# Patient Record
Sex: Female | Born: 1945 | Race: White | Hispanic: No | Marital: Married | Smoking: Former smoker
Health system: Southern US, Community
[De-identification: ages and names within clinical notes are randomized; demographics above are authoritative.]

## PROBLEM LIST (undated history)

## (undated) DIAGNOSIS — I10 Essential (primary) hypertension: Secondary | ICD-10-CM

## (undated) DIAGNOSIS — N200 Calculus of kidney: Secondary | ICD-10-CM

## (undated) DIAGNOSIS — Z85828 Personal history of other malignant neoplasm of skin: Secondary | ICD-10-CM

## (undated) DIAGNOSIS — R918 Other nonspecific abnormal finding of lung field: Secondary | ICD-10-CM

## (undated) DIAGNOSIS — Z923 Personal history of irradiation: Secondary | ICD-10-CM

## (undated) DIAGNOSIS — F17210 Nicotine dependence, cigarettes, uncomplicated: Secondary | ICD-10-CM

## (undated) DIAGNOSIS — R011 Cardiac murmur, unspecified: Secondary | ICD-10-CM

## (undated) DIAGNOSIS — C3490 Malignant neoplasm of unspecified part of unspecified bronchus or lung: Principal | ICD-10-CM

## (undated) DIAGNOSIS — J449 Chronic obstructive pulmonary disease, unspecified: Secondary | ICD-10-CM

## (undated) DIAGNOSIS — I712 Thoracic aortic aneurysm, without rupture: Secondary | ICD-10-CM

## (undated) DIAGNOSIS — I7121 Aneurysm of the ascending aorta, without rupture: Secondary | ICD-10-CM

## (undated) DIAGNOSIS — E785 Hyperlipidemia, unspecified: Secondary | ICD-10-CM

## (undated) DIAGNOSIS — M1990 Unspecified osteoarthritis, unspecified site: Secondary | ICD-10-CM

## (undated) DIAGNOSIS — C801 Malignant (primary) neoplasm, unspecified: Secondary | ICD-10-CM

## (undated) HISTORY — PX: COLONOSCOPY: SHX174

## (undated) HISTORY — DX: Calculus of kidney: N20.0

## (undated) HISTORY — DX: Malignant neoplasm of unspecified part of unspecified bronchus or lung: C34.90

## (undated) HISTORY — DX: Personal history of other malignant neoplasm of skin: Z85.828

## (undated) HISTORY — DX: Nicotine dependence, cigarettes, uncomplicated: F17.210

## (undated) HISTORY — DX: Personal history of irradiation: Z92.3

## (undated) HISTORY — DX: Hyperlipidemia, unspecified: E78.5

## (undated) HISTORY — DX: Essential (primary) hypertension: I10

---

## 1976-01-29 HISTORY — PX: PARTIAL HYSTERECTOMY: SHX80

## 2000-01-27 ENCOUNTER — Encounter: Payer: Self-pay | Admitting: Family Medicine

## 2000-01-27 ENCOUNTER — Encounter: Payer: Self-pay | Admitting: Emergency Medicine

## 2000-01-27 ENCOUNTER — Inpatient Hospital Stay (HOSPITAL_COMMUNITY): Admission: EM | Admit: 2000-01-27 | Discharge: 2000-01-28 | Payer: Self-pay | Admitting: Emergency Medicine

## 2000-01-29 DIAGNOSIS — N200 Calculus of kidney: Secondary | ICD-10-CM

## 2000-01-29 HISTORY — PX: KIDNEY STONE SURGERY: SHX686

## 2000-01-29 HISTORY — DX: Calculus of kidney: N20.0

## 2000-01-31 ENCOUNTER — Encounter: Payer: Self-pay | Admitting: Urology

## 2000-01-31 ENCOUNTER — Ambulatory Visit (HOSPITAL_COMMUNITY): Admission: RE | Admit: 2000-01-31 | Discharge: 2000-01-31 | Payer: Self-pay | Admitting: Urology

## 2000-02-01 ENCOUNTER — Encounter: Admission: RE | Admit: 2000-02-01 | Discharge: 2000-02-01 | Payer: Self-pay | Admitting: Family Medicine

## 2000-11-28 ENCOUNTER — Other Ambulatory Visit: Admission: RE | Admit: 2000-11-28 | Discharge: 2000-11-28 | Payer: Self-pay | Admitting: Family Medicine

## 2002-07-30 ENCOUNTER — Other Ambulatory Visit: Admission: RE | Admit: 2002-07-30 | Discharge: 2002-07-30 | Payer: Self-pay | Admitting: Family Medicine

## 2004-02-13 ENCOUNTER — Ambulatory Visit: Payer: Self-pay | Admitting: Family Medicine

## 2004-02-16 ENCOUNTER — Ambulatory Visit: Payer: Self-pay | Admitting: Family Medicine

## 2004-05-25 ENCOUNTER — Ambulatory Visit: Payer: Self-pay | Admitting: Family Medicine

## 2004-07-13 ENCOUNTER — Ambulatory Visit: Payer: Self-pay | Admitting: Family Medicine

## 2005-01-04 ENCOUNTER — Ambulatory Visit: Payer: Self-pay | Admitting: Family Medicine

## 2005-03-22 ENCOUNTER — Other Ambulatory Visit: Admission: RE | Admit: 2005-03-22 | Discharge: 2005-03-22 | Payer: Self-pay | Admitting: Family Medicine

## 2005-03-22 ENCOUNTER — Encounter: Payer: Self-pay | Admitting: Family Medicine

## 2005-03-22 ENCOUNTER — Ambulatory Visit: Payer: Self-pay | Admitting: Family Medicine

## 2005-03-22 LAB — CONVERTED CEMR LAB: Pap Smear: NORMAL

## 2005-04-04 ENCOUNTER — Ambulatory Visit: Payer: Self-pay | Admitting: Family Medicine

## 2005-08-05 ENCOUNTER — Ambulatory Visit: Payer: Self-pay | Admitting: Family Medicine

## 2005-11-25 ENCOUNTER — Ambulatory Visit: Payer: Self-pay | Admitting: Family Medicine

## 2005-11-29 ENCOUNTER — Ambulatory Visit: Payer: Self-pay | Admitting: Family Medicine

## 2005-11-29 ENCOUNTER — Ambulatory Visit: Payer: Self-pay | Admitting: Cardiology

## 2005-12-06 ENCOUNTER — Ambulatory Visit: Payer: Self-pay | Admitting: Family Medicine

## 2006-04-28 ENCOUNTER — Ambulatory Visit: Payer: Self-pay | Admitting: Family Medicine

## 2006-04-28 LAB — CONVERTED CEMR LAB
ALT: 23 units/L (ref 0–40)
AST: 25 units/L (ref 0–37)
Albumin: 3.9 g/dL (ref 3.5–5.2)
Alkaline Phosphatase: 94 units/L (ref 39–117)
BUN: 10 mg/dL (ref 6–23)
Basophils Absolute: 0.1 10*3/uL (ref 0.0–0.1)
Basophils Relative: 0.7 % (ref 0.0–1.0)
Bilirubin, Direct: 0.1 mg/dL (ref 0.0–0.3)
CO2: 30 meq/L (ref 19–32)
Calcium: 9.9 mg/dL (ref 8.4–10.5)
Chloride: 104 meq/L (ref 96–112)
Cholesterol: 148 mg/dL (ref 0–200)
Creatinine, Ser: 0.7 mg/dL (ref 0.4–1.2)
Eosinophils Absolute: 0.3 10*3/uL (ref 0.0–0.6)
Eosinophils Relative: 3.9 % (ref 0.0–5.0)
GFR calc Af Amer: 111 mL/min
GFR calc non Af Amer: 91 mL/min
Glucose, Bld: 105 mg/dL — ABNORMAL HIGH (ref 70–99)
HCT: 38.1 % (ref 36.0–46.0)
HDL: 52.8 mg/dL (ref >39.0)
Hemoglobin: 13.1 g/dL (ref 12.0–15.0)
LDL Cholesterol: 78 mg/dL (ref 0–99)
Lymphocytes Relative: 27.8 % (ref 12.0–46.0)
MCHC: 34.4 g/dL (ref 30.0–36.0)
MCV: 91.2 fL (ref 78.0–100.0)
Monocytes Absolute: 0.6 10*3/uL (ref 0.2–0.7)
Monocytes Relative: 7.2 % (ref 3.0–11.0)
Neutro Abs: 4.6 10*3/uL (ref 1.4–7.7)
Neutrophils Relative %: 60.4 % (ref 43.0–77.0)
Platelets: 230 10*3/uL (ref 150–400)
Potassium: 4.5 meq/L (ref 3.5–5.1)
RBC: 4.17 M/uL (ref 3.87–5.11)
RDW: 12 % (ref 11.5–14.6)
Sodium: 139 meq/L (ref 135–145)
TSH: 1.54 microintl units/mL (ref 0.35–5.50)
Total Bilirubin: 0.8 mg/dL (ref 0.3–1.2)
Total CHOL/HDL Ratio: 2.8
Total Protein: 7.5 g/dL (ref 6.0–8.3)
Triglycerides: 85 mg/dL (ref 0–149)
VLDL: 17 mg/dL (ref 0–40)
WBC: 7.8 10*3/uL (ref 4.5–10.5)

## 2006-05-09 ENCOUNTER — Ambulatory Visit: Payer: Self-pay | Admitting: Family Medicine

## 2006-10-17 ENCOUNTER — Ambulatory Visit: Payer: Self-pay | Admitting: Family Medicine

## 2006-10-17 DIAGNOSIS — I10 Essential (primary) hypertension: Secondary | ICD-10-CM | POA: Insufficient documentation

## 2006-10-17 DIAGNOSIS — C44300 Unspecified malignant neoplasm of skin of unspecified part of face: Secondary | ICD-10-CM | POA: Insufficient documentation

## 2006-10-17 DIAGNOSIS — E785 Hyperlipidemia, unspecified: Secondary | ICD-10-CM

## 2006-10-17 DIAGNOSIS — C44309 Unspecified malignant neoplasm of skin of other parts of face: Secondary | ICD-10-CM | POA: Insufficient documentation

## 2006-10-17 DIAGNOSIS — Z87891 Personal history of nicotine dependence: Secondary | ICD-10-CM

## 2006-10-17 DIAGNOSIS — J301 Allergic rhinitis due to pollen: Secondary | ICD-10-CM

## 2006-10-24 ENCOUNTER — Telehealth: Payer: Self-pay | Admitting: Family Medicine

## 2007-07-10 ENCOUNTER — Ambulatory Visit: Payer: Self-pay | Admitting: Family Medicine

## 2007-07-13 LAB — CONVERTED CEMR LAB
AST: 21 units/L (ref 0–37)
Albumin: 3.9 g/dL (ref 3.5–5.2)
BUN: 18 mg/dL (ref 6–23)
Basophils Relative: 1.1 % — ABNORMAL HIGH (ref 0.0–1.0)
Chloride: 101 meq/L (ref 96–112)
Creatinine, Ser: 0.7 mg/dL (ref 0.4–1.2)
Eosinophils Absolute: 0.3 10*3/uL (ref 0.0–0.7)
Eosinophils Relative: 4.3 % (ref 0.0–5.0)
GFR calc non Af Amer: 91 mL/min
HCT: 36.1 % (ref 36.0–46.0)
Hemoglobin: 12.5 g/dL (ref 12.0–15.0)
MCV: 92.7 fL (ref 78.0–100.0)
Monocytes Absolute: 0.4 10*3/uL (ref 0.1–1.0)
RBC: 3.9 M/uL (ref 3.87–5.11)
TSH: 1.13 microintl units/mL (ref 0.35–5.50)
WBC: 6.3 10*3/uL (ref 4.5–10.5)

## 2007-07-16 ENCOUNTER — Ambulatory Visit: Payer: Self-pay | Admitting: Family Medicine

## 2007-07-16 LAB — CONVERTED CEMR LAB: OCCULT 3: NEGATIVE

## 2007-07-21 LAB — FECAL OCCULT BLOOD, GUAIAC: Fecal Occult Blood: NEGATIVE

## 2007-09-02 ENCOUNTER — Encounter: Payer: Self-pay | Admitting: Family Medicine

## 2008-02-22 ENCOUNTER — Telehealth: Payer: Self-pay | Admitting: Family Medicine

## 2008-04-01 ENCOUNTER — Ambulatory Visit: Payer: Self-pay | Admitting: Family Medicine

## 2008-04-01 DIAGNOSIS — E162 Hypoglycemia, unspecified: Secondary | ICD-10-CM

## 2008-04-04 LAB — CONVERTED CEMR LAB
Calcium: 9.6 mg/dL (ref 8.4–10.5)
GFR calc Af Amer: 94 mL/min
GFR calc non Af Amer: 78 mL/min
Glucose, Bld: 112 mg/dL — ABNORMAL HIGH (ref 70–99)
Hgb A1c MFr Bld: 6 % (ref 4.6–6.0)
LDL Cholesterol: 105 mg/dL — ABNORMAL HIGH (ref 0–99)
Phosphorus: 3.2 mg/dL (ref 2.3–4.6)
Potassium: 5.1 meq/L (ref 3.5–5.1)
Sodium: 138 meq/L (ref 135–145)
Total CHOL/HDL Ratio: 3.2
VLDL: 11 mg/dL (ref 0–40)

## 2008-09-02 ENCOUNTER — Ambulatory Visit: Payer: Self-pay | Admitting: Family Medicine

## 2008-09-02 DIAGNOSIS — R7309 Other abnormal glucose: Secondary | ICD-10-CM | POA: Insufficient documentation

## 2008-09-05 LAB — CONVERTED CEMR LAB
Basophils Relative: 0.5 % (ref 0.0–3.0)
Eosinophils Absolute: 0.4 10*3/uL (ref 0.0–0.7)
Eosinophils Relative: 5.7 % — ABNORMAL HIGH (ref 0.0–5.0)
HCT: 35.7 % — ABNORMAL LOW (ref 36.0–46.0)
Hemoglobin: 12.5 g/dL (ref 12.0–15.0)
MCHC: 35.1 g/dL (ref 30.0–36.0)
MCV: 93.7 fL (ref 78.0–100.0)
Monocytes Absolute: 0.6 10*3/uL (ref 0.1–1.0)
Neutro Abs: 4.6 10*3/uL (ref 1.4–7.7)
RBC: 3.81 M/uL — ABNORMAL LOW (ref 3.87–5.11)

## 2008-09-13 ENCOUNTER — Encounter: Payer: Self-pay | Admitting: Family Medicine

## 2008-09-23 ENCOUNTER — Encounter (INDEPENDENT_AMBULATORY_CARE_PROVIDER_SITE_OTHER): Payer: Self-pay | Admitting: *Deleted

## 2009-11-14 ENCOUNTER — Ambulatory Visit: Payer: Self-pay | Admitting: Family Medicine

## 2009-11-15 LAB — CONVERTED CEMR LAB
ALT: 19 units/L (ref 0–35)
AST: 22 units/L (ref 0–37)
Albumin: 3.9 g/dL (ref 3.5–5.2)
Alkaline Phosphatase: 93 units/L (ref 39–117)
Basophils Relative: 0.6 % (ref 0.0–3.0)
Bilirubin, Direct: 0 mg/dL (ref 0.0–0.3)
CO2: 30 meq/L (ref 19–32)
Calcium: 10 mg/dL (ref 8.4–10.5)
Chloride: 99 meq/L (ref 96–112)
Eosinophils Absolute: 0.5 10*3/uL (ref 0.0–0.7)
Eosinophils Relative: 7.1 % — ABNORMAL HIGH (ref 0.0–5.0)
Hemoglobin: 11.7 g/dL — ABNORMAL LOW (ref 12.0–15.0)
LDL Cholesterol: 91 mg/dL (ref 0–99)
Lymphocytes Relative: 25.5 % (ref 12.0–46.0)
MCHC: 34 g/dL (ref 30.0–36.0)
Neutro Abs: 4.1 10*3/uL (ref 1.4–7.7)
RBC: 3.72 M/uL — ABNORMAL LOW (ref 3.87–5.11)
Sodium: 137 meq/L (ref 135–145)
Total CHOL/HDL Ratio: 3
Total Protein: 7.1 g/dL (ref 6.0–8.3)
WBC: 6.9 10*3/uL (ref 4.5–10.5)

## 2009-12-01 ENCOUNTER — Ambulatory Visit: Payer: Self-pay | Admitting: Family Medicine

## 2009-12-08 ENCOUNTER — Encounter: Payer: Self-pay | Admitting: Family Medicine

## 2009-12-11 ENCOUNTER — Encounter: Payer: Self-pay | Admitting: Family Medicine

## 2009-12-15 ENCOUNTER — Ambulatory Visit: Payer: Self-pay | Admitting: Family Medicine

## 2009-12-15 DIAGNOSIS — D649 Anemia, unspecified: Secondary | ICD-10-CM

## 2009-12-29 ENCOUNTER — Ambulatory Visit: Payer: Self-pay | Admitting: Family Medicine

## 2010-02-09 ENCOUNTER — Telehealth: Payer: Self-pay | Admitting: Family Medicine

## 2010-02-12 ENCOUNTER — Encounter: Payer: Self-pay | Admitting: Family Medicine

## 2010-02-18 ENCOUNTER — Encounter: Payer: Self-pay | Admitting: Orthopaedic Surgery

## 2010-02-27 NOTE — Assessment & Plan Note (Signed)
Summary: Pamela Levine 2 WEEK NURSE VISIT BLOOD PRESSURE CHECK/RBH  Nurse Visit   Vital Signs:  Patient profile:   65 year old female Height:      67.5 inches Weight:      127.25 pounds BMI:     19.71 Temp:     98.2 degrees F oral Pulse rate:   80 / minute Pulse rhythm:   regular BP sitting:   142 / 70  (left arm) Cuff size:   regular  Vitals Entered By: Lewanda Rife LPN (December 01, 2009 11:00 AM) CC: Patient present today for BP check at request of physician. Comments Relevant hx: Pt came today for BP check with no complaints.Pt is taking Benazepril-HCTZ 20-12.5mg  taking two tabs everyday. Pt does not have BP med for tomorrow and does not have refills on med if pt is to stay on Benazepril HCTZ 20-12.5mg . Pt uses [facility] [address] as her pharmacy. Contact # for pt is home [phone] or cell [phone] or work [phone] and ask for [name] in the [employer]. Please advise. Lewanda Rife LPN  December 01, 2009 11:04 AM    that bp is better- continue current med px written on EMR for call in   Patient notified as instructed by telephone. Medication phoned to [facility] [address] pharmacy as instructed. Pt will monitor pressure at home and already has appt to see Dr Milinda Antis on 12/15/09.Lewanda Rife LPN  December 01, 2009 3:04 PM   Allergies: No Known Drug Allergies  Orders Added: 1)  Est. Patient Level I [78469] Prescriptions: BENAZEPRIL-HYDROCHLOROTHIAZIDE 20-12.5 MG  TABS (BENAZEPRIL-HYDROCHLOROTHIAZIDE) 2 by mouth every am  #180 x 3   Entered and Authorized by:   Judith Part MD   Signed by:   Lewanda Rife LPN on 62/95/2841   Method used:   Telephoned to ...         RxID:   [id number]

## 2010-02-27 NOTE — Letter (Signed)
Summary: [facility]: Immunoassay Fecal Occult Blood (iFOB) Order Form  [place] [facility]  [address] [place], [place] [address]   Phone: [phone]  Fax: [phone]      [facility]: Immunoassay Fecal Occult Blood (iFOB) Order Form   November 14, 2009 MRN: [id number]   Pamela Levine Jun 10, 1945   Physicican Name:________Tower _________________  Diagnosis Code:___________V76.51_______________      Judith Part MD

## 2010-02-27 NOTE — Letter (Signed)
Summary: Results Follow up Letter  [place] [facility]  [address] [place], [place] [address]   Phone: [phone]  Fax: [phone]    12/11/2009 MRN: [id number]  Pamela Levine [address] [place], [place]  [address]  Dear [name],  The following are the results of your recent test(s):  Test         Result    Pap Smear:        Normal _____  Not Normal _____ Comments: ______________________________________________________ Cholesterol: LDL(Bad cholesterol):         Your goal is less than:         HDL (Good cholesterol):       Your goal is more than: Comments:  ______________________________________________________ Mammogram:        Normal __X___  Not Normal _____ Comments:mammogram is normal, next mammogram due in 1 year   ___________________________________________________________________ Hemoccult:        Normal _____  Not normal _______ Comments:    _____________________________________________________________________ Other Tests:    We routinely do not discuss normal results over the telephone.  If you desire a copy of the results, or you have any questions about this information we can discuss them at your next office visit.   Sincerely,      [name], MD

## 2010-02-27 NOTE — Assessment & Plan Note (Signed)
Summary: FOLLOW UP TO DISCUSS LABS/RI   Vital Signs:  Patient profile:   65 year old female Height:      67.5 inches Weight:      129.50 pounds BMI:     20.06 Temp:     98 degrees F oral Pulse rate:   76 / minute Pulse rhythm:   regular BP sitting:   172 / 78  (left arm) Cuff size:   regular  Vitals Entered By: Lewanda Rife LPN (December 15, 2009 9:12 AM) CC: follow-up visit to discuss labs   History of Present Illness: here for f/u of HTN and hyperglycemia and lipids  recent labs also new anemia with Hb 11.7 from 12.5 does not give blood  last colonoscopy many many years ago  not a lot of read meat or leafy greens  takes centrum and caltrate   AIC 6.4 up from 5.9 son had DM -- thought to be from father  she has no other family with it   in general -not a lot of sweets  not a lot of bread or pasta -- sandwhich every day  pasta once per week  potato  is conservative with portions  no juice except V8  wt is up 2 lb  lipids ok with stating trig 56 and HDL 56 and LDL 91  bp up again 172/78 did take her bp med today the hyzaar worked better in past  wants to go back to that  walks at work for exercise    is eating healthy and exercising    Allergies (verified): No Known Drug Allergies  Past History:  Past Medical History: Last updated: 09/02/2008 Hyperlipidemia Hypertension hx of basal cell skin ca light smoker son- DM   derm-- Dr Orson Aloe   Past Surgical History: Last updated: 10/17/2006 Kidney stone- stent (01/2000) Hysterectomy- partial, bleeding (1978) Renal US- normal (12/1999) Dexa- normal (11/202) Carotid doppler- ok (04/2001) Dexa- normal (03/2005) LS films- deg disk, 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 Eyes:  Denies blurring. CV:  Denies chest pain or discomfort and palpitations. Resp:  Denies cough, shortness of breath, and wheezing. GI:  Denies abdominal pain, change in bowel habits, and indigestion. GU:  Denies dysuria and urinary frequency. MS:  Denies muscle aches and cramps. Derm:  Denies itching, lesion(s), poor wound healing, and rash. Neuro:  Denies numbness and tingling. Psych:  mood is ok . Endo:  Denies cold intolerance, excessive thirst, excessive urination, and heat intolerance. Heme:  Denies abnormal bruising and bleeding.  Physical Exam  General:  Well-developed,well-nourished,in no acute distress; alert,appropriate and cooperative throughout examination Head:  normocephalic, atraumatic, and no abnormalities observed.   Eyes:  vision grossly intact, pupils equal, pupils round, and pupils reactive to light.  no conjunctival pallor, injection or icterus  Mouth:  pharynx pink and moist.   Neck:  supple with full rom and no masses or thyromegally, no JVD or carotid bruit  Chest Wall:  No deformities, masses, or  tenderness noted. Lungs:  Normal respiratory effort, chest expands symmetrically. Lungs are clear to auscultation, no crackles or wheezes. Heart:  Normal rate and regular rhythm. S1 and S2 normal without gallop, murmur, click, rub or other extra sounds. Abdomen:  Bowel sounds positive,abdomen soft and non-tender without masses, organomegaly or hernias noted. no renal bruits  Msk:  No deformity or scoliosis noted of thoracic or lumbar spine.  no acute joint changes  Pulses:  R and L carotid,radial,femoral,dorsalis pedis and posterior tibial  pulses are full and equal bilaterally Extremities:  No clubbing, cyanosis, edema, or deformity noted with normal full range of motion of all joints.   Neurologic:  sensation intact to light touch, gait normal, and DTRs symmetrical and normal.   Skin:  Intact without suspicious lesions or rashes Cervical Nodes:  No lymphadenopathy noted Inguinal Nodes:  No significant adenopathy Psych:  normal affect, talkative and pleasant   Diabetes Management Exam:    Foot Exam (with socks and/or shoes not present):       Sensory-Pinprick/Light touch:          Left medial foot (L-4): normal          Left dorsal foot (L-5): normal          Left lateral foot (S-1): normal          Right medial foot (L-4): normal          Right dorsal foot (L-5): normal          Right lateral foot (S-1): normal       Sensory-Monofilament:          Left foot: normal          Right foot: normal       Inspection:          Left foot: normal          Right foot: normal       Nails:          Left foot: normal          Right foot: normal   Impression & Recommendations:  Problem # 1:  UNSPECIFIED ANEMIA (ICD-285.9) Assessment New new - asymptomatic  also in need of screen colonosc- will ref for that  Orders: Gastroenterology Referral (GI)  Problem # 2:  SCREENING, COLON CANCER (ICD-V76.51) Assessment: New see above Orders: Gastroenterology Referral (GI)  Problem # 3:  HYPERGLYCEMIA (ICD-790.29) Assessment: Deteriorated this is worse  rev low glycemic diet in detail  urged to read SUGAR BUSTERS re check AIC in 3 mo   Problem # 4:  HYPERTENSION (ICD-401.9) Assessment: Deteriorated  is worse - will go back to hyzaar- more aff now check bp nurse visit 2-4 wk  f/u 3 mo after labs  Her updated medication list for this problem includes:    Hyzaar 100-12.5 Mg Tabs (Losartan potassium-hctz) .Marland Kitchen... 1 by mouth once daily  Orders: Prescription Created Electronically [phone])  Problem # 5:  HYPERLIPIDEMIA  (ICD-272.4) Assessment: Unchanged  good control zocor and diet  commended on this  Her updated medication list for this problem includes:    Zocor 20 Mg Tabs (Simvastatin) .Marland Kitchen... 1 by mouth once daily  Labs Reviewed: SGOT: 22 (11/14/2009)   SGPT: 19 (11/14/2009)   HDL:56.40 (11/14/2009), 52.2 (04/01/2008)  LDL:91 (11/14/2009), 105 (60/45/4098)  Chol:159 (11/14/2009), 168 (04/01/2008)  Trig:56.0 (11/14/2009), 56 (04/01/2008)  Complete Medication List: 1)  Hyzaar 100-12.5 Mg Tabs (Losartan potassium-hctz) .Marland Kitchen.. 1 by mouth once daily 2)  Caltrate 600+d 600-400  Mg-unit Tabs (Calcium carbonate-vitamin d) .... 2 by mouth daily 3)  Centrum Silver Tabs (Multiple vitamins-minerals) .... One by mouth daily 4)  Zocor 20 Mg Tabs (Simvastatin) .Marland Kitchen.. 1 by mouth once daily  Patient Instructions: 1)  change back to hyzaar  2)  follow up for nurse visit for blood pressure check in 2-4 weeks  3)  we will do referral for colonoscopy at check  4)  eat balanced diet minimizing starches and sugars (like sweets / bread/ pasta / potato/ rice) -- always choose brown instead of white  5)  get regular exercise  6)  get book called SUGAR BUSTERS-- is helpful  7)  schedule non fasting lab in 3 months AIC / renal / cbc with diff / ferritin / iron level / B12 - for anemia and hyperglycemia  8)  then follow up  Prescriptions: HYZAAR 100-12.5 MG TABS (LOSARTAN POTASSIUM-HCTZ) 1 by mouth once daily  #30 x 11   Entered and Authorized by:   Judith Part MD   Signed by:   Judith Part MD on 12/15/2009   Method used:   Electronically to        [facility] [address] (retail)       [address], [address]       [place], [place]  [address]       Ph: [phone]       Fax: [phone]   RxID:   [id number]    Orders Added: 1)  Gastroenterology Referral [GI] 2)  Prescription Created Electronically [G8553] 3)  Est. Patient Level IV [84132]    Current Allergies (reviewed  today): No known allergies

## 2010-02-27 NOTE — Assessment & Plan Note (Signed)
Summary: CPX/CLE   Vital Signs:  Patient profile:   65 year old female Height:      67.5 inches Weight:      129.75 pounds BMI:     20.09 Temp:     98.3 degrees F oral Pulse rate:   72 / minute Pulse rhythm:   regular BP sitting:   154 / 72  (left arm) Cuff size:   regular  Vitals Entered By: Lewanda Rife LPN (November 14, 2009 10:38 AM)  Serial Vital Signs/Assessments:  Time      Position  BP       Pulse  Resp  Temp     By                     160/80                         Judith Part MD  CC: CPX LMP partail hyst 1978   History of Present Illness: here for health mt exam and to rev chronic med problems   overall is feeling good   wt is stable with bmi of 20  bp up first check 154/72- had been well controlled- just took med 30 min ago has not checked it outside of office  has not had headache   lipid-- needs check  hx of basal cell-- had last derm last year  some new spots to check  does not get out in the sun   hx of smoking - quit -- and still a non smoker/ doing well with that   hyst partial in past -- 07 was last pap   dexa nl in 07 ca and D- is good about taking that   mam 8/10-- needs to set that up self exam - no lumps , but has a mole on side of R breast   colon screen -- not interested in colonosc   Td 02   flu shot today  occ leg cramps at night   Allergies (verified): No Known Drug Allergies  Past History:  Past Medical History: Last updated: 09/02/2008 Hyperlipidemia Hypertension hx of basal cell skin ca light smoker son- DM   derm-- Dr Orson Aloe   Past Surgical History: Last updated: 10/17/2006 Kidney stone- stent (01/2000) Hysterectomy- partial, bleeding (1978) Renal US- normal (12/1999) Dexa- normal (11/202) Carotid doppler- ok (04/2001) Dexa- normal (03/2005) LS films- deg disk, 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 Eyes:  Denies blurring and eye irritation. CV:  Denies chest pain or discomfort, lightheadness, and palpitations. Resp:  Denies cough and wheezing. GI:  Denies abdominal pain, change in bowel habits, and nausea. GU:  Denies abnormal vaginal bleeding, discharge, hematuria, and urinary frequency. MS:  Complains of cramps; denies low back pain, mid back pain, and muscle aches. Derm:  Denies itching, lesion(s), poor wound healing, and rash. Neuro:  Denies numbness and tingling. Psych:  Denies anxiety and depression. Endo:  Denies cold intolerance, excessive thirst, excessive urination, and heat intolerance. Heme:  Denies abnormal bruising and bleeding.  Physical Exam  General:  Well-developed,well-nourished,in no acute  distress; alert,appropriate and cooperative throughout examination Head:  normocephalic, atraumatic, and no abnormalities observed.   Eyes:  vision grossly intact, pupils equal, pupils round, and pupils reactive to light.  no conjunctival pallor, injection or icterus  Ears:  R ear normal and L ear normal.   Nose:  no nasal discharge.   Mouth:  pharynx pink and moist.   Neck:  supple with full rom and no masses or thyromegally, no JVD or carotid bruit  Chest Wall:  No deformities, masses, or tenderness noted. Breasts:  No mass, nodules, thickening, tenderness, bulging, retraction, inflamation, nipple discharge or skin changes noted.   Lungs:  Normal respiratory effort, chest expands symmetrically. Lungs are clear to auscultation, no crackles or wheezes. Heart:  Normal rate and regular rhythm. S1 and S2 normal without gallop, murmur, click, rub or other extra  sounds. Abdomen:  Bowel sounds positive,abdomen soft and non-tender without masses, organomegaly or hernias noted. no renal bruits  Genitalia:  normal introitus, no external lesions, no vaginal discharge, mucosa pink and moist, no vaginal atrophy, and no friaility or hemorrhage.   bimanual exam done uterus surg absent  no M noted  Msk:  No deformity or scoliosis noted of thoracic or lumbar spine.  no acute joint changes  Pulses:  R and L carotid,radial,femoral,dorsalis pedis and posterior tibial pulses are full and equal bilaterally Extremities:  No clubbing, cyanosis, edema, or deformity noted with normal full range of motion of all joints.   Neurologic:  sensation intact to light touch, gait normal, and DTRs symmetrical and normal.   Skin:  Intact without suspicious lesions or rashes lentigos diffusely  Cervical Nodes:  No lymphadenopathy noted Axillary Nodes:  No palpable lymphadenopathy Inguinal Nodes:  No significant adenopathy Psych:  normal affect, talkative and pleasant    Impression & Recommendations:  Problem # 1:  HEALTH MAINTENANCE EXAM (ICD-V70.0) Assessment Comment Only reviewed health habits including diet, exercise and skin cancer prevention reviewed health maintenance list and family history  wellness labs today Td and flu shot today Orders: Venipuncture (19147) TLB-Lipid Panel (80061-LIPID) TLB-BMP (Basic Metabolic Panel-BMET) (80048-METABOL) TLB-CBC Platelet - w/Differential (85025-CBCD) TLB-Hepatic/Liver Function Pnl (80076-HEPATIC) TLB-TSH (Thyroid Stimulating Hormone) (84443-TSH)  Problem # 2:  ROUTINE GYNECOLOGICAL EXAMINATION (ICD-V72.31) Assessment: Comment Only exam done without pap - in light of prior hyst  no changes   Problem # 3:  HYPERGLYCEMIA (ICD-790.29) Assessment: Unchanged check AIC today diet good and wt is stable Orders: TLB-A1C / Hgb A1C (Glycohemoglobin) (83036-A1C)  Problem # 4:  OTHER SCREENING MAMMOGRAM (ICD-V76.12) Assessment:  Comment Only annual mammogram scheduled adv pt to continue regular self breast exams non remarkable breast exam today  Orders: Radiology Referral (Radiology)  Problem # 5:  Hx of CARCINOMA, BASAL CELL, FACE (ICD-173.3) Assessment: Unchanged ref to derm for annual exam  pt is good with using sun protection Orders: Dermatology Referral (Derma)  Problem # 6:  HYPERTENSION (ICD-401.9) Assessment: Deteriorated  bp is high  will re check in 2 weeks and then if still high- may need to add med  Her updated medication list for this problem includes:    Benazepril-hydrochlorothiazide 20-12.5 Mg Tabs (Benazepril-hydrochlorothiazide) .Marland Kitchen... 2 by mouth every am  Orders: Venipuncture (82956) TLB-Lipid Panel (80061-LIPID) TLB-BMP (Basic Metabolic Panel-BMET) (80048-METABOL) TLB-CBC Platelet - w/Differential (85025-CBCD) TLB-Hepatic/Liver Function Pnl (80076-HEPATIC) TLB-TSH (Thyroid Stimulating Hormone) (84443-TSH)  BP today: 154/72 Prior BP: 140/68 (09/02/2008)  Labs Reviewed: K+: 5.1 (04/01/2008) Creat: : 0.8 (04/01/2008)   Chol: 168 (04/01/2008)   HDL: 52.2 (04/01/2008)  LDL: 105 (04/01/2008)   TG: 56 (04/01/2008)  Problem # 7:  HYPERLIPIDEMIA (ICD-272.4) Assessment: Unchanged  lab today and update on statin and good diet enc to get extra exercise Her updated medication list for this problem includes:    Zocor 20 Mg Tabs (Simvastatin) .Marland Kitchen... 1 by mouth once daily  Orders: Venipuncture (24401) TLB-Lipid Panel (80061-LIPID) TLB-BMP (Basic Metabolic Panel-BMET) (80048-METABOL) TLB-CBC Platelet - w/Differential (85025-CBCD) TLB-Hepatic/Liver Function Pnl (80076-HEPATIC) TLB-TSH (Thyroid Stimulating Hormone) (02725-DGU) Prescription Created Electronically [phone])  Labs Reviewed: SGOT: 29 (04/01/2008)   SGPT: 24 (04/01/2008)   HDL:52.2 (04/01/2008), 50.3 (07/10/2007)  LDL:105 (04/01/2008), 83 (74/25/9563)  Chol:168 (04/01/2008), 149 (07/10/2007)  Trig:56 (04/01/2008), 79  (07/10/2007)  Complete Medication List: 1)  Benazepril-hydrochlorothiazide 20-12.5 Mg Tabs (Benazepril-hydrochlorothiazide) .... 2 by mouth every am 2)  Caltrate 600+d 600-400 Mg-unit Tabs (Calcium carbonate-vitamin d) .... 2 by mouth daily 3)  Centrum Silver Tabs (Multiple vitamins-minerals) .... One by mouth daily 4)  Zocor 20 Mg Tabs (Simvastatin) .Marland Kitchen.. 1 by mouth once daily  Other Orders: Admin 1st Vaccine (87564) Flu Vaccine 38yrs + (33295) TD Toxoids IM 7 YR + (18841) Admin of Any Addtl Vaccine (66063)  Patient Instructions: 1)  schedule nurse visit in 2 weeks for blood pressure check  2)  watch salt in diet  3)  stay as active as you can  4)  labs today 5)  flu shot and tetnus shot today  6)  we will schedule mammogram at check out  7)  please do stool card for colon screening  8)  we will do dermatology referral at check out  9)  the chiropractor I like in [place] is Dr Jeannetta Ellis  Prescriptions: ZOCOR 20 MG TABS (SIMVASTATIN) 1 by mouth once daily  #90 x 3   Entered and Authorized by:   Judith Part MD   Signed by:   Judith Part MD on 11/14/2009   Method used:   Electronically to        [facility] [address] (retail)       [address], [address]       [place], [place]  [address]       Ph: [phone]       Fax: [phone]   RxID:   [id number]    Orders Added: 1)  Admin 1st Vaccine [90471] 2)  Flu Vaccine 32yrs + [71062] 3)  Venipuncture [69485] 4)  TLB-Lipid Panel [80061-LIPID] 5)  TLB-BMP (Basic Metabolic Panel-BMET) [80048-METABOL] 6)  TLB-CBC Platelet - w/Differential [85025-CBCD] 7)  TLB-Hepatic/Liver Function Pnl [80076-HEPATIC] 8)  TLB-TSH (Thyroid Stimulating Hormone) [84443-TSH] 9)  TLB-A1C / Hgb A1C (Glycohemoglobin) [83036-A1C] 10)  TD Toxoids IM 7 YR + [90714] 11)  Admin of Any Addtl Vaccine [90472] 12)  Dermatology Referral [Derma] 13)  Radiology Referral [Radiology] 14)  Prescription Created  Electronically [G8553] 15)  Est. Patient 40-64 years [99396]   Immunizations Administered:  Tetanus Vaccine:    Vaccine Type: Td    Site: right deltoid    Mfr: Sanofi Pasteur    Dose: 0.5 ml    Route: IM    Given by: Lewanda Rife LPN    Exp. Date: 03/01/2011    Lot #: [id number]    VIS given: 12/16/07 version given November 14, 2009.   Immunizations Administered:  Tetanus Vaccine:    Vaccine Type: Td    Site: right deltoid    Mfr: Sanofi Pasteur    Dose: 0.5 ml  Route: IM    Given by: Lewanda Rife LPN    Exp. Date: 03/01/2011    Lot #: [id number]    VIS given: 12/16/07 version given November 14, 2009.  Current Allergies (reviewed today): No known allergies  Flu Vaccine Consent Questions     Do you have a history of severe allergic reactions to this vaccine? no    Any prior history of allergic reactions to egg and/or gelatin? no    Do you have a sensitivity to the preservative Thimersol? no    Do you have a past history of Guillan-Barre Syndrome? no    Do you currently have an acute febrile illness? no    Have you ever had a severe reaction to latex? no    Vaccine information given and explained to patient? yes    Are you currently pregnant? no    Lot Number:[id number]   Exp Date:07/28/2010   Site Given  Left Deltoid IM.lbflu1 Lewanda Rife LPN  November 14, 2009 10:45 AM

## 2010-02-27 NOTE — Assessment & Plan Note (Signed)
Summary: [facility] blood pressure check/rbh  Nurse Visit   Vital Signs:  Patient profile:   65 year old female Height:      67.5 inches Weight:      126.25 pounds BMI:     19.55 Temp:     97.9 degrees F oral Pulse rate:   76 / minute Pulse rhythm:   regular BP sitting:   148 / 68  (left arm) Cuff size:   regular  Vitals Entered By: Lewanda Rife LPN (December 29, 2009 9:58 AM) CC: Patient came to day for BP monitoring. Comments Pt is presently taking Hyzaar  100-12.5mg  one tablet daily. Pt states she feels good, has more energy and no h/a or dizziness. Pt can be reached today at cell [phone] or home [phone]. Pt uses [facility] [address] if pharmacy is needed.Please advise. Lewanda Rife LPN  December 29, 2009 10:02 AM    Allergies: No Known Drug Allergies  Orders Added: 1)  Est. Patient Level I [44010]  Appended Document: [facility] blood pressure check/rbh please let pt know that her bp is improving -- will check again at next f/u  I'm glad she is feeling better  Appended Document: [facility] blood pressure check/rbh Patient notified as instructed by telephone. Pt was counseled on BP med regime and pt already has appt with Dr Milinda Antis on 03/23/10 at 9:15am.

## 2010-03-01 NOTE — Progress Notes (Signed)
Summary: prior Berkley Harvey is needed for hyzaar  Phone Note From Pharmacy   Caller: [facility]  [phone]*/ [facility] Summary of Call: Prior Berkley Harvey is needed for hyzaar, form is on your shelf.               Lowella Petties CMA, AAMA  February 09, 2010 3:33 PM   Follow-up for Phone Call        form done and in nurse in box  please scan also  Follow-up by: Judith Part MD,  February 12, 2010 1:32 PM  Additional Follow-up for Phone Call Additional follow up Details #1::        Form faxed.               Lowella Petties CMA, AAMA  February 12, 2010 2:36 PM  Prior approval given for losartan, pharmacy advised.  Approval letter placed on doctor's desk for signature and scanning. Additional Follow-up by: Lowella Petties CMA, AAMA,  February 15, 2010 4:54 PM

## 2010-03-07 NOTE — Medication Information (Signed)
Summary: Prior autho & approved for Losartan/[facility]  Prior autho & approved for Losartan/[facility]   Imported By: Sherian Rein 02/28/2010 08:09:13  _____________________________________________________________________  External Attachment:    Type:   Image     Comment:   External Document

## 2010-03-15 ENCOUNTER — Encounter (INDEPENDENT_AMBULATORY_CARE_PROVIDER_SITE_OTHER): Payer: Self-pay | Admitting: *Deleted

## 2010-03-15 ENCOUNTER — Other Ambulatory Visit: Payer: Self-pay | Admitting: Family Medicine

## 2010-03-15 ENCOUNTER — Other Ambulatory Visit (INDEPENDENT_AMBULATORY_CARE_PROVIDER_SITE_OTHER): Payer: BC Managed Care – PPO

## 2010-03-15 DIAGNOSIS — D649 Anemia, unspecified: Secondary | ICD-10-CM

## 2010-03-15 DIAGNOSIS — R7309 Other abnormal glucose: Secondary | ICD-10-CM

## 2010-03-15 LAB — IRON: Iron: 81 ug/dL (ref 42–145)

## 2010-03-15 LAB — CBC WITH DIFFERENTIAL/PLATELET
Basophils Absolute: 0.1 10*3/uL (ref 0.0–0.1)
Eosinophils Absolute: 0.5 10*3/uL (ref 0.0–0.7)
HCT: 35.7 % — ABNORMAL LOW (ref 36.0–46.0)
Lymphs Abs: 1.7 10*3/uL (ref 0.7–4.0)
MCHC: 34.8 g/dL (ref 30.0–36.0)
MCV: 92.8 fl (ref 78.0–100.0)
Monocytes Absolute: 0.4 10*3/uL (ref 0.1–1.0)
Neutrophils Relative %: 63.8 % (ref 43.0–77.0)
Platelets: 258 10*3/uL (ref 150.0–400.0)
RDW: 14.1 % (ref 11.5–14.6)
WBC: 7.2 10*3/uL (ref 4.5–10.5)

## 2010-03-15 LAB — RENAL FUNCTION PANEL
Calcium: 9.6 mg/dL (ref 8.4–10.5)
Creatinine, Ser: 0.8 mg/dL (ref 0.4–1.2)
Glucose, Bld: 125 mg/dL — ABNORMAL HIGH (ref 70–99)
Phosphorus: 3.4 mg/dL (ref 2.3–4.6)
Potassium: 4.7 mEq/L (ref 3.5–5.1)
Sodium: 136 mEq/L (ref 135–145)

## 2010-03-15 LAB — FERRITIN: Ferritin: 103.7 ng/mL (ref 10.0–291.0)

## 2010-03-15 LAB — HEMOGLOBIN A1C: Hgb A1c MFr Bld: 6 % (ref 4.6–6.5)

## 2010-03-16 ENCOUNTER — Other Ambulatory Visit: Payer: Self-pay

## 2010-03-23 ENCOUNTER — Ambulatory Visit (INDEPENDENT_AMBULATORY_CARE_PROVIDER_SITE_OTHER): Payer: BC Managed Care – PPO | Admitting: Family Medicine

## 2010-03-23 ENCOUNTER — Encounter: Payer: Self-pay | Admitting: Family Medicine

## 2010-03-23 DIAGNOSIS — E785 Hyperlipidemia, unspecified: Secondary | ICD-10-CM

## 2010-03-23 DIAGNOSIS — R7309 Other abnormal glucose: Secondary | ICD-10-CM

## 2010-03-23 DIAGNOSIS — I10 Essential (primary) hypertension: Secondary | ICD-10-CM

## 2010-03-27 ENCOUNTER — Encounter: Payer: Self-pay | Admitting: Family Medicine

## 2010-04-05 NOTE — Assessment & Plan Note (Signed)
Summary: 3 month follow up//rbh   Vital Signs:  Patient profile:   65 year old female Height:      67.5 inches Weight:      127.25 pounds BMI:     19.71 Temp:     97.7 degrees F oral Pulse rate:   76 / minute Pulse rhythm:   regular BP sitting:   154 / 76  (left arm) Cuff size:   regular  Vitals Entered By: Lewanda Rife LPN (March 23, 2010 9:35 AM) CC: 3 month f/u and pt thnks BP is up due to pressure feeling in head.   History of Present Illness: here for f/u of HTN and lipids     feels like bp is up -- pressure in her head- almost a headache  can feel in her eyes  last bp check improved  higher today 154/76 is very stressed - at work -- vacation is coming up soon   is no longer smoker -- 2 years -- doing well   eating less sugar for hyperglycemia   anemia is better  B12 and iron are fine   Allergies (verified): No Known Drug Allergies  Past History:  Past Medical History: Last updated: 09/02/2008 Hyperlipidemia Hypertension hx of basal cell skin ca light smoker son- DM   derm-- Dr Orson Aloe   Past Surgical History: Last updated: 10/17/2006 Kidney stone- stent (01/2000) Hysterectomy- partial, bleeding (1978) Renal US- normal (12/1999) Dexa- normal (11/202) Carotid doppler- ok (04/2001) Dexa- normal (03/2005) LS films- deg disk, 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 Eyes:  Denies blurring and eye irritation. CV:  Denies chest pain or discomfort, palpitations, shortness of breath with exertion, and swelling of feet. Resp:   Denies cough, shortness of breath, and wheezing. GI:  Denies abdominal pain, indigestion, nausea, and vomiting. MS:  Denies joint pain, joint redness, and joint swelling. Neuro:  Complains of headaches; denies seizures, sensation of room spinning, tingling, tremors, visual disturbances, and weakness. Psych:  Denies anxiety and depression. Endo:  Denies cold intolerance, excessive thirst, excessive urination, and heat intolerance. Heme:  Denies abnormal bruising and bleeding.  Physical Exam  General:  Well-developed,well-nourished,in no acute distress; alert,appropriate and cooperative throughout examination Head:  normocephalic, atraumatic, and no abnormalities observed.   Eyes:  vision grossly intact, pupils equal, pupils round, and pupils reactive to light.  no conjunctival pallor, injection or icterus  Mouth:  pharynx pink and moist.   Neck:  supple with full rom and no masses or thyromegally, no JVD or carotid bruit  Chest Wall:  No deformities, masses, or tenderness noted. Lungs:  Normal respiratory effort, chest expands symmetrically. Lungs are clear to auscultation, no crackles or wheezes. Heart:  Normal rate and regular rhythm. S1 and S2 normal without gallop, murmur, click, rub or other extra sounds. Abdomen:  Bowel sounds positive,abdomen soft and non-tender without masses, organomegaly or hernias noted. no renal bruits  Msk:  No deformity or scoliosis noted of thoracic or lumbar spine.  no acute joint changes  Pulses:  R and L carotid,radial,femoral,dorsalis pedis and posterior tibial pulses are full and equal bilaterally  Extremities:  No clubbing, cyanosis, edema, or deformity noted with normal full range of motion of all joints.   Neurologic:  sensation intact to light touch, gait normal, and DTRs symmetrical and normal.   Skin:  Intact without suspicious lesions or rashes Cervical Nodes:  No lymphadenopathy noted Inguinal Nodes:  No significant adenopathy Psych:  normal  affect, talkative and pleasant    Impression & Recommendations:  Problem # 1:  UNSPECIFIED ANEMIA (ICD-285.9) Assessment Improved  this seems to be resolved with balanced diet enc to keep that up  rev labs with pt   Hgb: 12.4 (03/15/2010)   Hct: 35.7 (03/15/2010)   Platelets: 258.0 (03/15/2010) RBC: 3.84 (03/15/2010)   RDW: 14.1 (03/15/2010)   WBC: 7.2 (03/15/2010) MCV: 92.8 (03/15/2010)   MCHC: 34.8 (03/15/2010) Ferritin: 103.7 (03/15/2010) Iron: 81 (03/15/2010)   B12: 455 (03/15/2010)   TSH: 1.35 (11/14/2009)  Problem # 2:  HYPERGLYCEMIA (ICD-790.29) Assessment: Improved  also improved with better diet- commended rev labs with pt rev low glycemic diet and exercise  rev diet   Labs Reviewed: Creat: 0.8 (03/15/2010)     Problem # 3:  HYPERTENSION (ICD-401.9) Assessment: Deteriorated  bp is up  will add norvasc 5 mg and update adv to call if side eff like swelling fu 4-6 wk  Her updated medication list for this problem includes:    Hyzaar 100-12.5 Mg Tabs (Losartan potassium-hctz) .Marland Kitchen... 1 by mouth once daily    Norvasc 5 Mg Tabs (Amlodipine besylate) .Marland Kitchen... 1 by mouth once daily  BP today: 154/76 Prior BP: 148/68 (12/29/2009)  Labs Reviewed: K+: 4.7 (03/15/2010) Creat: : 0.8 (03/15/2010)   Chol: 159 (11/14/2009)   HDL: 56.40 (11/14/2009)   LDL: 91 (11/14/2009)   TG: 56.0 (11/14/2009)  Orders: Prescription Created Electronically [phone])  Complete Medication List: 1)  Hyzaar 100-12.5 Mg Tabs (Losartan potassium-hctz) .Marland Kitchen.. 1 by mouth once daily 2)  Caltrate 600+d 600-400 Mg-unit Tabs (Calcium carbonate-vitamin d) .... 2 by mouth daily 3)  Centrum Silver Tabs (Multiple vitamins-minerals) .... One by mouth daily 4)  Zocor 20 Mg Tabs (Simvastatin) .Marland Kitchen.. 1 by mouth once daily 5)  Norvasc 5 Mg Tabs (Amlodipine besylate) .Marland Kitchen.. 1 by mouth once daily  Patient Instructions: 1)  blood pressure is too high 2)  add amlodipine 5 mg once daily 3)  can take this with the  hyzaar 4)  update me if any side effects  5)  sugar control is good 6)  anemia is better  7)  follow up in 4-6 weeks to re check blood pressure  Prescriptions: NORVASC 5 MG TABS (AMLODIPINE BESYLATE) 1 by mouth once daily  #30 x 11   Entered and Authorized by:   Judith Part MD   Signed by:   Judith Part MD on 03/23/2010   Method used:   Electronically to        [facility]  [address] (retail)       [address], [address]       [place], [place]  [address]       Ph: [phone]       Fax: [phone]   RxID:   [id number]    Orders Added: 1)  Prescription Created Electronically [[id number]] 2)  Est. Patient Level III [34742]    Current Allergies (reviewed today): No known allergies

## 2010-04-06 ENCOUNTER — Encounter: Payer: Self-pay | Admitting: Family Medicine

## 2010-04-06 ENCOUNTER — Ambulatory Visit: Payer: Self-pay | Admitting: Gastroenterology

## 2010-04-17 NOTE — Consult Note (Signed)
Summary: [facility]  [facility]   Imported By: [name] 04/13/2010 14:47:33  _____________________________________________________________________  External Attachment:    Type:   Image     Comment:   External Document

## 2010-04-26 NOTE — Procedures (Signed)
Summary: Colonoscopy Report/[facility]  Colonoscopy Report/[facility]   Imported By: [name] 04/16/2010 15:53:42  _____________________________________________________________________  External Attachment:    Type:   Image     Comment:   External Document  Appended Document: Colonoscopy Report/[facility]    Clinical Lists Changes  Observations: Added new observation of COLONNXTDUE: 03/2020 (04/16/2010 17:16) Added new observation of COLONOSCOPY: normal (04/06/2010 17:17)       Preventive Care Screening  Colonoscopy:    Date:  04/06/2010    Next Due:  03/2020    Results:  normal

## 2010-04-28 ENCOUNTER — Encounter: Payer: Self-pay | Admitting: Family Medicine

## 2010-05-01 ENCOUNTER — Ambulatory Visit: Payer: BC Managed Care – PPO | Admitting: Family Medicine

## 2010-05-25 ENCOUNTER — Other Ambulatory Visit: Payer: Self-pay | Admitting: *Deleted

## 2010-05-25 MED ORDER — LOSARTAN POTASSIUM-HCTZ 100-12.5 MG PO TABS: 1.0000 | ORAL_TABLET | Freq: Every day | ORAL | Status: DC

## 2010-05-25 MED ORDER — SIMVASTATIN 20 MG PO TABS: 20.0000 mg | ORAL_TABLET | Freq: Every day | ORAL | Status: DC

## 2010-05-25 MED ORDER — AMLODIPINE BESYLATE 5 MG PO TABS: 5.0000 mg | ORAL_TABLET | Freq: Every day | ORAL | Status: DC

## 2010-05-25 NOTE — Telephone Encounter (Signed)
Patient notified as instructed by telephone. Prescription left at front desk.  

## 2010-05-25 NOTE — Telephone Encounter (Signed)
Px printed for pick up in IN box  

## 2010-05-25 NOTE — Telephone Encounter (Signed)
Patient has new insurance company they are requiring her to use mail order. She is asking if she can get written scripts to mail in. Please call when rxs are ready.

## 2010-06-01 ENCOUNTER — Encounter: Payer: Self-pay | Admitting: Family Medicine

## 2010-06-01 ENCOUNTER — Ambulatory Visit (INDEPENDENT_AMBULATORY_CARE_PROVIDER_SITE_OTHER): Payer: BC Managed Care – PPO | Admitting: Family Medicine

## 2010-06-01 DIAGNOSIS — I10 Essential (primary) hypertension: Secondary | ICD-10-CM

## 2010-06-01 NOTE — Assessment & Plan Note (Signed)
This is improved on 2nd check- both arms after sitting and relaxing for 10 min  140s/60 Expect further imp Long disc over decrease in caffiene (gradually)  Also decreased sodium and inc water intake Will work on this and f/u 6 mo

## 2010-06-01 NOTE — Progress Notes (Signed)
  Subjective:    Patient ID: Pamela Levine, female    DOB: 1945/02/22, 65 y.o.   MRN: [id number]  HPI Here for f/u of HTN  bp is 154/68 today - last visit we added amlodipine  Is feeling pretty good  No problems with amlodipine at all  Has not checked like bp since last visit  No headaches anymore   No wt change  Stress-- is still a big problem.  Past Medical History  Diagnosis Date  . Hyperlipidemia   . Hypertension   . History  of basal cell carcinoma   . Light cigarette smoker   . Kidney stone 01/2000    stent. Renal U/S normal in 12/01.       Review of Systems  Review of Systems  Constitutional: Negative for fever, appetite change, fatigue and unexpected weight change.  Eyes: Negative for pain and visual disturbance.  Respiratory: Negative for cough and shortness of breath.   Cardiovascular: Negative.   Gastrointestinal: Negative for nausea, diarrhea and constipation.  Genitourinary: Negative for urgency and frequency.  Skin: Negative for pallor.  Neurological: Negative for weakness, light-headedness, numbness and headaches.  Hematological: Negative for adenopathy. Does not bruise/bleed easily.  Psychiatric/Behavioral: Negative for dysphoric mood. The patient is not nervous/anxious.          Objective:   Physical Exam  Constitutional: She appears well-developed and well-nourished.  HENT:  Head: Normocephalic and atraumatic.  Eyes: Conjunctivae and EOM are normal. Pupils are equal, round, and reactive to light.  Neck: Normal range of motion. Neck supple. No JVD present. No thyromegaly present.  Cardiovascular: Normal rate, regular rhythm and normal heart sounds.   No murmur heard. Pulmonary/Chest: Effort normal. She has no wheezes.  Abdominal: Soft. Bowel sounds are normal. She exhibits no abdominal bruit and no mass. There is no tenderness.  Musculoskeletal: Normal range of motion. She exhibits no edema and no tenderness.  Lymphadenopathy:    She has no  cervical adenopathy.  Neurological: She is alert. She displays normal reflexes. Coordination normal.  Skin: Skin is warm and dry. No rash noted. No erythema. No pallor.  Psychiatric: She has a normal mood and affect.          Assessment & Plan:

## 2010-06-01 NOTE — Patient Instructions (Signed)
Start weaning off of caffiene -- do 1/2 decaf and 1/2 caff coffee for now --caffiene can affect bp  Drink lots of water Avoid salt and salty/ processed foods  No change in medicines  bp is steadily improving  Follow up in 6 months  If you ever want to talk to a counselor about stress- let me know

## 2010-06-15 NOTE — Assessment & Plan Note (Signed)
[facility]                                   ON-CALL NOTE   NAME:Pamela Levine, Pamela Levine                       MRN:          [id number]  DATE:12/02/2005                            DOB:          06/01/1945    OBJECTIVE:  Patient has what sounds like low back strain with muscle spasms.  I spoke with her earlier and told her I would call in a prescription, which  I just did about 5 minutes ago.  Prescription was called to the [facility] at  [phone].  Prescription was for Etodolac 400 mg b.i.d., #16, no refills and  Flexeril 10 mg t.i.d., #16, no refills.  Again, reiterated she should be  using ice and heat, which she has not yet done after 3 hours.    ______________________________  Arta Silence, MD    RNS/MedQ  DD: 12/02/2005  DT: 12/03/2005  Job #: [id number]

## 2010-06-15 NOTE — Discharge Summary (Signed)
[place]. [facility]  Patient:    Pamela Levine, Pamela Levine                       MRN: [id number] Adm. Date:  [id number] Disc. Date: [id number] Attending:  McDiarmid, Leighton Roach. Dictator:   Michell Heinrich, M.D.                           Discharge Summary  ADMISSION DIAGNOSES: 1. Ureteral calculus with obstructing hydronephrosis. 2. Complicating pyelonephritis. 3. Hyponatremia.  DISCHARGE DIAGNOSES: 1. Ureteral calculus with obstructing hydronephrosis. 2. Complicating pyelonephritis. 3. Hyponatremia. 4. Status post PIG ureteral stent placement.  CHRONIC PROBLEM LIST: 1. Hypertension. 2. Tobacco abuse.  DISCHARGE MEDICATIONS: 1. Ciprofloxacin 500 mg one tablet p.o. b.i.d. for 12 days. 2. Ampicillin 500 mg one tablet p.o. q.6h. for 12 days. 3. Vicodin 5/500 one to two tablets p.o. q.4-6h. as needed for pain.  CONSULTS:  Dr. Excell Seltzer. Wrenn in urology on January 27, 2000.  He placed a ureteral stent on the right and recommended broadening of the IV antibiotic coverage.   He set up an appointment for lithotripsy for January 31, 2000 pending further evaluation in his office on January 30, 2000.  He recommended discharge home on oral Cipro to finish a 14-day course.  PROCEDURES: 1. CT scan of the abdomen on January 27, 2000 showed a 4 mm distal ureteral    stone with hydronephrosis on the right. 2. PIG ureteral stent placement on January 27, 2000 by Dr. Annabell Howells.  BRIEF HISTORY:  For full history and physical, please see the admission H&P done by the resident, in the chart.  Briefly, this is a 65 year old white female with past medical history significant for essential hypertension who presented with an approximately two week history of abdominal pain and flank pain.  Her illness had been progressing and she had had much diminished oral intake and had been vomiting for a week prior to admission.  She had no previous history of a renal stone or an upper or lower  urinary tract infection.  PHYSICAL EXAMINATION ON PRESENTATION:  VITAL SIGNS:  Temperature 101.7, pulse 100, blood pressure 100/42.  GENERAL:  The patient was alert, oriented, in mild distress secondary to pain.  CARDIOVASCULAR AND RESPIRATORY:  Unremarkable.  ABDOMEN:  Right lower quadrant tenderness.  No CVA tenderness was noted.  PERTINENT LABORATORY DATA ON ADMISSION:  Sodium 121, potassium 3.7, chloride 85, bicarb 26, BUN 18, creatinine 0.8, glucose 113.  UA was negative, no rbcs or white blood cells.  WBC 25.0, hemoglobin 10.1, platelets 639, MCV 87.7.  CT scan of the abdomen showed a 4 mm obstructing renal calculus on the right.  IMPRESSION AND PLAN:  This 65 year old white female with hypertension was found to have an obstructing right renal stone and was admitted to the hospital for IV hydration and urologic evaluation and treatment for her ureteral calculi.  HOSPITAL COURSE: #1 - OBSTRUCTING RIGHT RENAL CALCULUS:  Patient was given IV pain medicine initially (Demerol and Phenergan) but was quickly switched over to oral pain medication (Vicodin).  Dr. Annabell Howells in urology was consulted, and he evaluated the patient and placed a right ureteral PIG stent.  He ordered a KUB to be done the morning after admission, but this was never performed.  The patients pain was much diminished by the time of discharge and it was determined that the further workup for this could be done as an  outpatient by Dr. Annabell Howells.  She was discharged home on the p.o. pain medicine as dictated in the discharge medications.  Tentative plan was to do lithotripsy on the calculus on January 31, 2000.  #2 - COMPLICATED PYELONEPHRITIS:  Patient initially was given a dose of Unasyn and was also started on oral ciprofloxacin.  Initially, the patients white count was 25,000 but this diminished to 16,000 on the day of discharge. Patients symptoms seemed to improve with IV hydration plus these systemic antibiotics.   She was discharged home on the oral antibiotics dictated in the discharge medication section of this dictation.  #3 - HYPONATREMIA:  This was believed to be due to persistent vomiting and continued use of diuretic (HCTZ) at home.  She was begun on normal saline upon admission and her sodium slowly began to normalize and was 130 on discharge.  DISPOSITION:  The patient was discharged home in much improved condition on January 28, 2000.  ACTIVITY:  She had no restrictions on activity.  SPECIAL INSTRUCTIONS:  She was instructed to drink lots of high sodium fluids. She was also instructed to take her previously-dictated discharge medications and to hold her antihypertensive medications until further follow-up with primary M.D. or Dr. Annabell Howells.  FOLLOW-UP:  Follow-up is arranged with Dr. Annabell Howells on January 30, 2000 at 9 a.m. at his office at [address], [place]. DD:  01/28/00 TD:  01/28/00 Job: [id number]

## 2010-06-15 NOTE — Op Note (Signed)
. [facility]  Patient:    Pamela Levine, Pamela Levine                       MRN: [id number] Proc. Date: 01/27/00 Adm. Date:  [id number] Attending:  Doug Sou                           Operative Report  PREOPERATIVE DIAGNOSIS:  Right ureteral stone with urinary tract infection and fever.  POSTOPERATIVE DIAGNOSIS:  Right ureteral stone with urinary tract infection and fever.  OPERATION:  Cystoscopy, right retrograde pyelogram and interpretation.  SURGEON:  Excell Seltzer. Annabell Howells, M.D.  ANESTHESIA:  General.  SPECIMENS:  Culture aspirated from renal pelvis.  DRAINS:  6 French x 24 cm right double-J stent and Foley catheter.  COMPLICATIONS:  None.  INDICATIONS:  [name] is a 65 year old white female with a 1-1/2 week history of fever and right flank pain who has been found to have a right ureteral stone and UTI.  She is to undergo cystoscopy and stent insertion with retrograde pyelogram.  DESCRIPTION OF PROCEDURE:  The patient was taken to the operating room.  She had been on Cipro and was given Unasyn.  General anesthetic was induced.  She was placed in the lithotomy position.  Her perineum and genitalia were prepped with Betadine solution.  She was draped in the usual sterile fashion. Cystoscopy was performed using the 22 [place] scope and the 12 and 70 degree lenses.  Examination revealed a normal urethra.  The bladder wall was smooth and pale without tumor, stones or inflammation.  The ureteral orifices were in their normal anatomic position.  The right ureteral orifice was cannulated with a 5 French opening catheter.  Contrast was instilled in a retrograde fashion.  This demonstrated a normal distal mid ureter at the junction of the proximal and mid ureter was a filling defect with proximal hydronephrosis suspicious for stone.  A 5 French opening catheter was passed to the level of the filling defect.  I could not get by that.  A guidewire was then  passed through the stent and advanced to the kidney.  The opening catheter was then advanced over the guidewire.  The wire was removed.  A urine sample was aspirated from the kidney.  It was sent for culture.  The guidewire was reinserted.  The opening catheter was removed, and a 6 French 24 cm double-J stent was inserted over the wire without difficulty.  No string was left on the stent.  After the stent was in good position, and this was confirmed by fluoroscopy, the bladder was drained and an 18 French Foley catheter was inserted.  The balloon was filled with 10 cc of sterile fluid.  The catheter was placed to straight drainage.  The patient was taken down from the lithotomy position.  Her anesthetic was reversed and she was removed to the recovery room in stable condition.  There were no complications. DD:  01/27/00 TD:  01/27/00 Job: [id number]

## 2010-06-15 NOTE — Op Note (Signed)
[place]. [facility]  Patient:    Pamela Levine, Pamela Levine                       MRN: [id number] Proc. Date: 01/31/00 Adm. Date:  [id number] Attending:  Evlyn Clines                           Operative Report  PREOPERATIVE DIAGNOSIS:  Right ureteral stent, febrile urinary tract infection.  POSTOPERATIVE DIAGNOSIS:  Right ureteral stent, febrile urinary tract infection with probable interval passage or breakup of the stone.  OPERATION PERFORMED:  Cystoscopy, stent removal, right ureteroscopy and right stent insertion.  SURGEON:  Excell Seltzer. Annabell Howells, M.D.  ANESTHESIA:  General.  DRAINS:  6 French x 26 cm right double-J stent.  COMPLICATIONS:  None.  INDICATIONS FOR PROCEDURE:  The patient is a 65 year old white female who initially presented with febrile urinary tract infection and an obstructing 4 mm right ureteral stone.  She has previously undergone retrograde pyelogram which did not delineate the location of the stone as well as I would like. This was followed by stent insertion.  After she recovered from the febrile UTI, she returns now for ureteroscopy to identify and remove the stone. Office KUB suggested the presence of a stone along the ureter over the bony pelvis.  DESCRIPTION OF PROCEDURE:  The patient has been on p.o. antibiotics.  She was taken to the operating room where general anesthetic was induced.  She was placed in the lithotomy position.  Her perineum and genitalia were prepped with Betadine solution.  She was draped in the usual sterile fashion. Cystoscopy was performed using the 22 [place] scope and the 12 degree lens and a grasping forceps. The stent from the right ureteral orifice was grasped and pulled through the urethral meatus.  A wire was placed through the stent to the kidney.  A 6 French short ureteroscope was then passed alongside the wire.  I was able to inspect the ureter up the bony pelvis.  No stone could be  seen. Inspection of the bladder and the drapes revealed no evidence of stone at this time.  I then placed a 6 French flexible ureteroscope over the wire to the kidney and inspected the entire length of the urethra and the internal collecting system.  I was unable to identify the stone.  Contrast was instilled to ensure that I was able to inspect the entire kidney.  I was able to look at the upper and midpolar regions.  There was a single lower pole calix that I could not access but there was no evidence of filling defect within this portion of the collecting system.  After thorough inspection had been performed, the ureteroscope was backed out under direct vision.  Once again, no stone was seen.  A guide wire was then passed through the scope up to the kidney.  The scope was removed.  The cystoscope was inserted over the guide wire to the bladder and a 6 French 26 cm double-J stent was inserted over the wire to the kidney under fluoroscopic guidance.  The wire was removed.  We had a good coil of stent in the kidney and in the bladder.  The bladder was drained.  The stent string was left exiting the urethra.  It was tied and cut short.  At this point the patient was taken down from lithotomy position.  Her anesthetic was reversed.  She  was removed to the recovery room in stable condition.  There were no complications. DD:  01/31/00 TD:  01/31/00 Job: [id number]

## 2010-06-15 NOTE — Assessment & Plan Note (Signed)
[facility]                                   ON-CALL NOTE   NAME:WILLIAMSAnarie, Kalish                     MRN:          [id number]  DATE:12/01/2005                            DOB:          [birth year]    PRIMARY CARE PHYSICIAN:  Dr. Milinda Antis.   Mrs. Brosious' husband called in this morning stating that she was seen on  Friday for significant back pain. She was subsequently evaluated with a CT  scan to rule out a kidney stone. According to the husband the CT scan was  negative. Earlier that morning she was given Mobic for a hand injury by her  orthopedic surgeon and thus they felt that this would help her with her low  back pain. According to her husband, the pain is pretty severe today and the  Mobic has not been helpful.   PLAN:  I advised her husband that she needs to be reassessed given at this  point the pain is worsening and not improving with Mobic. They will take her  to [facility].    ______________________________  Leanne Chang, M.D.    LA/MedQ  DD: 12/01/2005  DT: 12/01/2005  Job #: [id number]   cc:   Marne A. Milinda Antis, MD

## 2010-06-15 NOTE — Assessment & Plan Note (Signed)
[facility]                                   ON-CALL NOTE   NAME:Pamela Levine, [place]                       MRN:          [id number]  DATE:12/03/2003                            DOB:          [birth year]    OBJECTIVE:  I actually spoke with the patient herself, who was having kidney  stone pain and called today and talked to [place].  Was seen Friday afternoon  by a nurse practitioner, who thought she probably had low back pain, but had  to rule out a kidney stone, was sent for a CT scan, and indeed no stone was  seen.  Was told she had a pulled muscle and a prescription was called [facility].  Also, had seen ortho Friday morning for a trigger finger, was given  pain medication by Dr. Merlyn Lot, where she was told to use over the weekend,  which was meloxicam 7.5 mg once a day.  She tried that, by Saturday  afternoon was having bad pain and started taking Aleve rather than the  meloxicam.  Called yesterday on call, received no medications.  Called this  morning again and talked with Tasha, and has not heard back.   ASSESSMENT:  Presumably muscle spasm of the low back.   PLAN:  We will call in Etodolac 400 mg b.i.d., 60 and no refills and  Flexeril 10 mg t.i.d., 16 no refills to [facility] at [phone].    ______________________________  Arta Silence, MD    RNS/MedQ  DD: 12/02/2005  DT: 12/03/2005  Job #: [id number]

## 2010-11-30 ENCOUNTER — Ambulatory Visit (INDEPENDENT_AMBULATORY_CARE_PROVIDER_SITE_OTHER): Payer: BC Managed Care – PPO | Admitting: Family Medicine

## 2010-11-30 ENCOUNTER — Encounter: Payer: Self-pay | Admitting: Family Medicine

## 2010-11-30 VITALS — BP 152/70 | HR 84 | Temp 98.5°F | Ht 67.5 in | Wt 127.8 lb

## 2010-11-30 DIAGNOSIS — I10 Essential (primary) hypertension: Secondary | ICD-10-CM

## 2010-11-30 DIAGNOSIS — Z23 Encounter for immunization: Secondary | ICD-10-CM

## 2010-11-30 DIAGNOSIS — E785 Hyperlipidemia, unspecified: Secondary | ICD-10-CM

## 2010-11-30 DIAGNOSIS — R7309 Other abnormal glucose: Secondary | ICD-10-CM

## 2010-11-30 LAB — COMPREHENSIVE METABOLIC PANEL
ALT: 23 U/L (ref 0–35)
Alkaline Phosphatase: 85 U/L (ref 39–117)
CO2: 31 mEq/L (ref 19–32)
Calcium: 9.5 mg/dL (ref 8.4–10.5)
Chloride: 100 mEq/L (ref 96–112)
Creatinine, Ser: 0.9 mg/dL (ref 0.4–1.2)
GFR: 67.15 mL/min (ref >60.00)
Glucose, Bld: 109 mg/dL — ABNORMAL HIGH (ref 70–99)
Sodium: 138 mEq/L (ref 135–145)
Total Bilirubin: 0.6 mg/dL (ref 0.3–1.2)

## 2010-11-30 LAB — LIPID PANEL
Cholesterol: 181 mg/dL (ref 0–200)
HDL: 76.5 mg/dL (ref >39.00)
LDL Cholesterol: 97 mg/dL (ref 0–99)
Triglycerides: 38 mg/dL (ref 0.0–149.0)
VLDL: 7.6 mg/dL (ref 0.0–40.0)

## 2010-11-30 LAB — HEMOGLOBIN A1C: Hgb A1c MFr Bld: 6.1 % (ref 4.6–6.5)

## 2010-11-30 LAB — TSH: TSH: 1.25 u[IU]/mL (ref 0.35–5.50)

## 2010-11-30 MED ORDER — LOSARTAN POTASSIUM-HCTZ 100-12.5 MG PO TABS: 1.0000 | ORAL_TABLET | Freq: Every day | ORAL | Status: DC

## 2010-11-30 MED ORDER — AMLODIPINE BESYLATE 10 MG PO TABS: 10.0000 mg | ORAL_TABLET | Freq: Every day | ORAL | Status: DC

## 2010-11-30 MED ORDER — SIMVASTATIN 20 MG PO TABS: 20.0000 mg | ORAL_TABLET | Freq: Every day | ORAL | Status: DC

## 2010-11-30 NOTE — Progress Notes (Signed)
Subjective:    Patient ID: Pamela Levine, female    DOB: [birth year], 65 y.o.   MRN: [id number]  HPI Here for f/u of HTN and lipids and hyperglycemia Last labs in feb Is feeling good in general   Today bp is 168/74 on 2nd check No cp or palpitations or ha Last visit was better on 2nd check and disc lifestyle change-- cannot feel that bp is up  At home her monitor at home is good -- 150/65 this am -- wrist cuff   Wt is stable with bmi of 19  Did disc cutting caffiene-- went to decaf - proud of that   Lipids on zocor  Lab Results  Component Value Date   CHOL 159 11/14/2009   HDL 56.40 11/14/2009   LDLCALC 91 11/14/2009   TRIG 56.0 11/14/2009   CHOLHDL 3 11/14/2009   diet - is good  Is avoiding red meat and fried foods and egg yolks   Hyperglycemia - is staying away from sugar -- no sweets  Not a big bread eater  Lab Results  Component Value Date   HGBA1C 6.0 03/15/2010   No thirst or excess urination   Flu shot today  Patient Active Problem List  Diagnoses  . CARCINOMA, BASAL CELL, FACE  . HYPOGLYCEMIA, UNSPECIFIED  . HYPERLIPIDEMIA  . UNSPECIFIED ANEMIA  . HYPERTENSION  . ALLERGIC RHINITIS, SEASONAL  . HYPERGLYCEMIA  . TOBACCO USE, QUIT  . CARCINOMA, BASAL CELL, FACE   Past Medical History  Diagnosis Date  . Hyperlipidemia   . Hypertension   . History  of basal cell carcinoma   . Light cigarette smoker   . Kidney stone 01/2000    stent. Renal U/S normal in 12/01.   Past Surgical History  Procedure Date  . Partial hysterectomy 1978    bleeding  . Kidney stone surgery 01/2000    stent   History  Substance Use Topics  . Smoking status: Former Smoker    Quit date: 01/29/2007  . Smokeless tobacco: Not on file   Comment: cut down in 2009- smokes once weekly, quit 06/2008.  Marland Kitchen Alcohol Use: Yes     occasional   Family History  Problem Relation Age of Onset  . Cancer Mother     lung- non smoker  . Hypertension Father   . Stroke Father     cerebral  hemorrhage  . Cancer Sister     lung  . Diabetes Son    No Known Allergies Current Outpatient Prescriptions on File Prior to Visit  Medication Sig Dispense Refill  . Calcium Carbonate-Vitamin D (CALTRATE 600+D) 600-400 MG-UNIT per tablet Take 2 tablets by mouth daily.        . Multiple Vitamins-Minerals (CENTRUM SILVER PO) Take by mouth daily.            Review of Systems Review of Systems  Constitutional: Negative for fever, appetite change, fatigue and unexpected weight change.  Eyes: Negative for pain and visual disturbance.  Respiratory: Negative for cough and shortness of breath.   Cardiovascular: Negative for cp or palpitations    Gastrointestinal: Negative for nausea, diarrhea and constipation.  Genitourinary: Negative for urgency and frequency.  Skin: Negative for pallor or rash   Neurological: Negative for weakness, light-headedness, numbness and headaches.  Hematological: Negative for adenopathy. Does not bruise/bleed easily.  Psychiatric/Behavioral: Negative for dysphoric mood. The patient is not nervous/anxious.          Objective:   Physical Exam  Constitutional: She  appears well-developed and well-nourished. No distress.  HENT:  Head: Normocephalic and atraumatic.  Mouth/Throat: Oropharynx is clear and moist.  Eyes: Conjunctivae and EOM are normal. Pupils are equal, round, and reactive to light. No scleral icterus.  Neck: Normal range of motion. Neck supple. No JVD present. Carotid bruit is not present. No thyromegaly present.  Cardiovascular: Normal rate, regular rhythm, normal heart sounds and intact distal pulses.   Pulmonary/Chest: Effort normal and breath sounds normal. No respiratory distress. She has no wheezes.  Abdominal: Soft. Bowel sounds are normal. She exhibits no distension and no abdominal bruit. There is no tenderness.  Musculoskeletal: She exhibits no edema.  Lymphadenopathy:    She has no cervical adenopathy.  Neurological: She is alert. She  has normal reflexes. No cranial nerve deficit. Coordination normal.  Skin: Skin is warm and dry. No rash noted. No erythema. No pallor.  Psychiatric: She has a normal mood and affect.          Assessment & Plan:

## 2010-11-30 NOTE — Patient Instructions (Signed)
Increase your amlodipine (norvasc) to 10 mg once daily  Continue other medicines without change  Labs today  Flu shot today  Follow up in 4-6 weeks to see how blood pressure is doing

## 2010-12-02 NOTE — Assessment & Plan Note (Signed)
Lab Results  Component Value Date   HGBA1C 6.1 11/30/2010   overall doing well with low glycemic diet  Will continue to follow

## 2010-12-02 NOTE — Assessment & Plan Note (Signed)
Not in opt control Disc lifestyle habits  Rev goals for bp and reasons Inc norvasc to 10 mg daily- will update if problems or side eff F/u 6 wk

## 2010-12-02 NOTE — Assessment & Plan Note (Signed)
Lab Results  Component Value Date   CHOL 181 11/30/2010   HDL 76.50 11/30/2010   LDLCALC 97 11/30/2010   TRIG 38.0 11/30/2010   CHOLHDL 2 11/30/2010   Disc goals for lipids and reasons to control them Rev labs with pt Rev low sat fat diet in detail

## 2011-01-04 ENCOUNTER — Ambulatory Visit: Payer: BC Managed Care – PPO | Admitting: Family Medicine

## 2011-01-14 ENCOUNTER — Ambulatory Visit: Payer: BC Managed Care – PPO | Admitting: Family Medicine

## 2011-01-14 DIAGNOSIS — Z0289 Encounter for other administrative examinations: Secondary | ICD-10-CM

## 2011-05-10 ENCOUNTER — Encounter: Payer: Self-pay | Admitting: Family Medicine

## 2011-05-10 ENCOUNTER — Ambulatory Visit (INDEPENDENT_AMBULATORY_CARE_PROVIDER_SITE_OTHER): Payer: BC Managed Care – PPO | Admitting: Family Medicine

## 2011-05-10 VITALS — BP 144/60 | HR 83 | Temp 97.2°F | Ht 67.5 in | Wt 128.2 lb

## 2011-05-10 DIAGNOSIS — I10 Essential (primary) hypertension: Secondary | ICD-10-CM

## 2011-05-10 NOTE — Assessment & Plan Note (Signed)
Overall bp improved -though pt's own cuff was off today - she will purchase new one (I suggested arm in stead of wrist cuff) avg 140s/60 Will see also how it is at home Will continue current medicines  F/u 3 mo for PE with labs prior Disc good health habits- diet and exercise

## 2011-05-10 NOTE — Progress Notes (Signed)
Subjective:    Patient ID: Pamela Levine, female    DOB: August 07, 1945, 66 y.o.   MRN: [id number]  HPI Has been feeling good  No changes since last time   bp at home 124/64 Has been doing well   Has to correspond with a health nurse for ins     bp is  144/60   Today No cp or palpitations or headaches or edema  No side effects to medicines   Last visit bp was up so we increased her norvasc to 10 mg -- is tolerating that well   Lots of walking  Weight stays good - is slim Is eating well  Sometimes a snack at night   Does watch sweets and fats  Lab Results  Component Value Date   HGBA1C 6.1 11/30/2010    Did hear her heart M today- has had for most of life Comes and goes No symptoms   Patient Active Problem List  Diagnoses  . CARCINOMA, BASAL CELL, FACE  . HYPOGLYCEMIA, UNSPECIFIED  . HYPERLIPIDEMIA  . UNSPECIFIED ANEMIA  . HYPERTENSION  . ALLERGIC RHINITIS, SEASONAL  . HYPERGLYCEMIA  . TOBACCO USE, QUIT  . CARCINOMA, BASAL CELL, FACE   Past Medical History  Diagnosis Date  . Hyperlipidemia   . Hypertension   . History  of basal cell carcinoma   . Light cigarette smoker   . Kidney stone 01/2000    stent. Renal U/S normal in 12/01.   Past Surgical History  Procedure Date  . Partial hysterectomy 1978    bleeding  . Kidney stone surgery 01/2000    stent   History  Substance Use Topics  . Smoking status: Former Smoker    Quit date: 01/29/2007  . Smokeless tobacco: Not on file   Comment: cut down in 2009- smokes once weekly, quit 06/2008.  Marland Kitchen Alcohol Use: Yes     occasional   Family History  Problem Relation Age of Onset  . Cancer Mother     lung- non smoker  . Hypertension Father   . Stroke Father     cerebral hemorrhage  . Cancer Sister     lung  . Diabetes Son    No Known Allergies Current Outpatient Prescriptions on File Prior to Visit  Medication Sig Dispense Refill  . amLODipine (NORVASC) 10 MG tablet Take 1 tablet (10 mg total) by mouth  daily.  90 tablet  3  . aspirin 81 MG chewable tablet Chew 81 mg by mouth daily.        . Calcium Carbonate-Vitamin D (CALTRATE 600+D) 600-400 MG-UNIT per tablet Take 2 tablets by mouth daily.        Marland Kitchen losartan-hydrochlorothiazide (HYZAAR) 100-12.5 MG per tablet Take 1 tablet by mouth daily.  90 tablet  3  . Multiple Vitamins-Minerals (CENTRUM SILVER PO) Take by mouth daily.        . simvastatin (ZOCOR) 20 MG tablet Take 1 tablet (20 mg total) by mouth daily.  90 tablet  3         Review of Systems Review of Systems  Constitutional: Negative for fever, appetite change, fatigue and unexpected weight change.  Eyes: Negative for pain and visual disturbance.  Respiratory: Negative for cough and shortness of breath.   Cardiovascular: Negative for cp or palpitations    Gastrointestinal: Negative for nausea, diarrhea and constipation.  Genitourinary: Negative for urgency and frequency.  Skin: Negative for pallor or rash   Neurological: Negative for weakness, light-headedness, numbness and headaches.  Hematological: Negative for adenopathy. Does not bruise/bleed easily.  Psychiatric/Behavioral: Negative for dysphoric mood. The patient is not nervous/anxious.          Objective:   Physical Exam  Constitutional: She appears well-developed and well-nourished. No distress.  HENT:  Head: Normocephalic and atraumatic.  Mouth/Throat: Oropharynx is clear and moist.  Eyes: Conjunctivae and EOM are normal. Pupils are equal, round, and reactive to light. No scleral icterus.  Neck: Normal range of motion. Neck supple. No JVD present. Carotid bruit is not present. No thyromegaly present.  Cardiovascular: Normal rate, regular rhythm and intact distal pulses.   Murmur heard. Pulmonary/Chest: Effort normal and breath sounds normal. No respiratory distress. She has no wheezes.  Abdominal: Soft. Bowel sounds are normal. She exhibits no distension. There is no tenderness.  Lymphadenopathy:    She has  no cervical adenopathy.  Neurological: She is alert. She has normal reflexes. No cranial nerve deficit. She exhibits normal muscle tone. Coordination normal.  Skin: Skin is warm and dry. No rash noted. No pallor.  Psychiatric: She has a normal mood and affect.          Assessment & Plan:

## 2011-05-10 NOTE — Patient Instructions (Signed)
bp is improved today on our cuff  Continue same medication and stay active/ eat a healthy diet  If you are interested in shingles vaccine in future - call your insurance company to see how coverage is and call us to schedule vaccine or we will send it to a pharmacy  Follow up for PE with labs prior in about 3 months  We will discuss bone density then

## 2011-05-12 DIAGNOSIS — R011 Cardiac murmur, unspecified: Secondary | ICD-10-CM | POA: Insufficient documentation

## 2011-05-12 NOTE — Assessment & Plan Note (Signed)
Per pt has had throughout life No symptoms Sometimes is audible and other times not

## 2011-08-23 ENCOUNTER — Encounter: Payer: BC Managed Care – PPO | Admitting: Family Medicine

## 2011-10-12 ENCOUNTER — Telehealth: Payer: Self-pay | Admitting: Family Medicine

## 2011-10-12 DIAGNOSIS — R7309 Other abnormal glucose: Secondary | ICD-10-CM

## 2011-10-12 DIAGNOSIS — E785 Hyperlipidemia, unspecified: Secondary | ICD-10-CM

## 2011-10-12 DIAGNOSIS — Z0000 Encounter for general adult medical examination without abnormal findings: Secondary | ICD-10-CM | POA: Insufficient documentation

## 2011-10-12 NOTE — Telephone Encounter (Signed)
Message copied by Judy Pimple on Sat Oct 12, 2011  1:09 PM ------      Message from: Baldomero Lamy      Created: Tue Oct 08, 2011 10:31 AM      Regarding: Cpx labs Mon 9/16       Please order  future cpx labs for pt's upcomming lab appt.      Thanks      Rodney Booze

## 2011-10-14 ENCOUNTER — Other Ambulatory Visit: Payer: BC Managed Care – PPO

## 2011-10-18 ENCOUNTER — Encounter: Payer: Self-pay | Admitting: Family Medicine

## 2011-10-18 ENCOUNTER — Ambulatory Visit (INDEPENDENT_AMBULATORY_CARE_PROVIDER_SITE_OTHER): Payer: BC Managed Care – PPO | Admitting: Family Medicine

## 2011-10-18 ENCOUNTER — Telehealth: Payer: Self-pay

## 2011-10-18 ENCOUNTER — Ambulatory Visit (INDEPENDENT_AMBULATORY_CARE_PROVIDER_SITE_OTHER)
Admission: RE | Admit: 2011-10-18 | Discharge: 2011-10-18 | Disposition: A | Discharge status: Home / Self Care | Payer: BC Managed Care – PPO | Source: Ambulatory Visit | Attending: Family Medicine | Admitting: Family Medicine

## 2011-10-18 VITALS — BP 182/84 | HR 76 | Temp 98.2°F | Ht 67.25 in | Wt 118.0 lb

## 2011-10-18 DIAGNOSIS — Z87891 Personal history of nicotine dependence: Secondary | ICD-10-CM

## 2011-10-18 DIAGNOSIS — Z1231 Encounter for screening mammogram for malignant neoplasm of breast: Secondary | ICD-10-CM | POA: Insufficient documentation

## 2011-10-18 DIAGNOSIS — C44300 Unspecified malignant neoplasm of skin of unspecified part of face: Secondary | ICD-10-CM

## 2011-10-18 DIAGNOSIS — Z0000 Encounter for general adult medical examination without abnormal findings: Secondary | ICD-10-CM

## 2011-10-18 DIAGNOSIS — C44309 Unspecified malignant neoplasm of skin of other parts of face: Secondary | ICD-10-CM

## 2011-10-18 DIAGNOSIS — R634 Abnormal weight loss: Secondary | ICD-10-CM

## 2011-10-18 DIAGNOSIS — R7309 Other abnormal glucose: Secondary | ICD-10-CM

## 2011-10-18 DIAGNOSIS — I10 Essential (primary) hypertension: Secondary | ICD-10-CM

## 2011-10-18 DIAGNOSIS — E785 Hyperlipidemia, unspecified: Secondary | ICD-10-CM

## 2011-10-18 LAB — CBC WITH DIFFERENTIAL/PLATELET
Basophils Absolute: 0.1 10*3/uL (ref 0.0–0.1)
Basophils Relative: 1.3 % (ref 0.0–3.0)
Eosinophils Relative: 7 % — ABNORMAL HIGH (ref 0.0–5.0)
Hemoglobin: 11.6 g/dL — ABNORMAL LOW (ref 12.0–15.0)
Lymphocytes Relative: 24.1 % (ref 12.0–46.0)
Monocytes Relative: 6.8 % (ref 3.0–12.0)
Neutro Abs: 4.4 10*3/uL (ref 1.4–7.7)
RBC: 3.91 Mil/uL (ref 3.87–5.11)

## 2011-10-18 LAB — COMPREHENSIVE METABOLIC PANEL
ALT: 19 U/L (ref 0–35)
BUN: 25 mg/dL — ABNORMAL HIGH (ref 6–23)
CO2: 30 mEq/L (ref 19–32)
Calcium: 9.8 mg/dL (ref 8.4–10.5)
Chloride: 98 mEq/L (ref 96–112)
Creatinine, Ser: 1.1 mg/dL (ref 0.4–1.2)
GFR: 53.12 mL/min — ABNORMAL LOW (ref >60.00)
Glucose, Bld: 109 mg/dL — ABNORMAL HIGH (ref 70–99)

## 2011-10-18 LAB — LIPID PANEL
Cholesterol: 178 mg/dL (ref 0–200)
LDL Cholesterol: 112 mg/dL — ABNORMAL HIGH (ref 0–99)

## 2011-10-18 MED ORDER — AMLODIPINE BESYLATE 10 MG PO TABS: 10.0000 mg | ORAL_TABLET | Freq: Every day | ORAL | Status: DC

## 2011-10-18 MED ORDER — LOSARTAN POTASSIUM-HCTZ 100-12.5 MG PO TABS: 1.0000 | ORAL_TABLET | Freq: Every day | ORAL | Status: DC

## 2011-10-18 MED ORDER — SIMVASTATIN 20 MG PO TABS: 20.0000 mg | ORAL_TABLET | Freq: Every day | ORAL | Status: DC

## 2011-10-18 NOTE — Telephone Encounter (Signed)
Erskine Squibb with [facility] radiology called CXR report.Report in pts chart already. Dr Milinda Antis notified.

## 2011-10-18 NOTE — Progress Notes (Signed)
Subjective:    Patient ID: Pamela Levine, female    DOB: 04-13-1945, 66 y.o.   MRN: [id number]  HPI  Here for health maintenance exam and to review chronic medical problems   Wt is down 10 lb with bmi of 18 Is not trying to loose Eating the same as she always has  Eats on the run  ? If any more or less active- a bit busier at work   Did have a really bad cold 2-3 mo ago -could not eat- perhaps she could not eat well then  bp is up bp is stable today  No cp or palpitations or headaches or edema  No side effects to medicines  BP Readings from Last 3 Encounters:  10/18/11 182/84  05/10/11 144/60  11/30/10 152/70   she was surprised - at home gets 140/60s  ? whitecoat    Partial hyst in past For bleeding , no cervical problems or abn paps in the past  mammo 11/11--did not get one in 2012 Needs mam in [place]  Self exam- has not noticed breast lumps  Flu shot- will get today  Lipid Lab Results  Component Value Date   CHOL 181 11/30/2010   CHOL 159 11/14/2009   CHOL 168 04/01/2008   Lab Results  Component Value Date   HDL 76.50 11/30/2010   HDL 40.98 11/14/2009   HDL 52.2 04/01/2008   Lab Results  Component Value Date   LDLCALC 97 11/30/2010   LDLCALC 91 11/14/2009   LDLCALC 105* 04/01/2008   Lab Results  Component Value Date   TRIG 38.0 11/30/2010   TRIG 56.0 11/14/2009   TRIG 56 04/01/2008   Lab Results  Component Value Date   CHOLHDL 2 11/30/2010   CHOLHDL 3 11/14/2009   CHOLHDL 3.2 CALC 04/01/2008   No results found for this basename: LDLDIRECT     Chemistry      Component Value Date/Time   NA 138 11/30/2010 1108   K 4.3 11/30/2010 1108   CL 100 11/30/2010 1108   CO2 31 11/30/2010 1108   BUN 27* 11/30/2010 1108   CREATININE 0.9 11/30/2010 1108      Component Value Date/Time   CALCIUM 9.5 11/30/2010 1108   ALKPHOS 85 11/30/2010 1108   AST 28 11/30/2010 1108   ALT 23 11/30/2010 1108   BILITOT 0.6 11/30/2010 1108     Lab Results  Component Value Date   HGBA1C 6.1 11/30/2010    .  colonosc 3/12- was ok with 10 year recall  Needs all her labs today  Patient Active Problem List  Diagnosis  . Malignant neoplasm of skin of parts of face  . HYPOGLYCEMIA, UNSPECIFIED  . HYPERLIPIDEMIA  . UNSPECIFIED ANEMIA  . HYPERTENSION  . ALLERGIC RHINITIS, SEASONAL  . HYPERGLYCEMIA  . TOBACCO USE, QUIT  . CARCINOMA, BASAL CELL, FACE  . Heart murmur  . Routine general medical examination at a health care facility  . Weight loss, non-intentional  . Other screening mammogram   Past Medical History  Diagnosis Date  . Hyperlipidemia   . Hypertension   . History  of basal cell carcinoma   . Light cigarette smoker   . Kidney stone 01/2000    stent. Renal U/S normal in 12/01.   Past Surgical History  Procedure Date  . Partial hysterectomy 1978    bleeding  . Kidney stone surgery 01/2000    stent   History  Substance Use Topics  . Smoking status: Former Smoker  Quit date: 01/29/2007  . Smokeless tobacco: Not on file   Comment: cut down in 2009- smokes once weekly, quit 06/2008.  Marland Kitchen Alcohol Use: Yes     occasional   Family History  Problem Relation Age of Onset  . Cancer Mother     lung- non smoker  . Hypertension Father   . Stroke Father     cerebral hemorrhage  . Cancer Sister     lung  . Diabetes Son    No Known Allergies Current Outpatient Prescriptions on File Prior to Visit  Medication Sig Dispense Refill  . amLODipine (NORVASC) 10 MG tablet Take 1 tablet (10 mg total) by mouth daily.  90 tablet  3  . aspirin 81 MG chewable tablet Chew 81 mg by mouth daily.        . Calcium Carbonate-Vitamin D (CALTRATE 600+D) 600-400 MG-UNIT per tablet Take 2 tablets by mouth daily.        Marland Kitchen losartan-hydrochlorothiazide (HYZAAR) 100-12.5 MG per tablet Take 1 tablet by mouth daily.  90 tablet  3  . Multiple Vitamins-Minerals (CENTRUM SILVER PO) Take by mouth daily.        . simvastatin (ZOCOR) 20 MG tablet Take 1 tablet (20 mg total) by  mouth daily.  90 tablet  3     Review of Systems Review of Systems  Constitutional: Negative for fever, appetite change, fatigue and pos for unexpected wt change .  Eyes: Negative for pain and visual disturbance.  Respiratory: Negative for cough and shortness of breath.   Cardiovascular: Negative for cp or palpitations    Gastrointestinal: Negative for nausea, diarrhea and constipation.  Genitourinary: Negative for urgency and frequency.  Skin: Negative for pallor or rash   Neurological: Negative for weakness, light-headedness, numbness and headaches.  Hematological: Negative for adenopathy. Does not bruise/bleed easily.  Psychiatric/Behavioral: Negative for dysphoric mood. The patient is not nervous/anxious.         Objective:   Physical Exam  Constitutional: She appears well-developed and well-nourished. No distress.  HENT:  Head: Normocephalic and atraumatic.  Right Ear: External ear normal.  Left Ear: External ear normal.  Nose: Nose normal.  Mouth/Throat: Oropharynx is clear and moist. No oropharyngeal exudate.  Eyes: Conjunctivae normal and EOM are normal. Pupils are equal, round, and reactive to light. No scleral icterus.  Neck: Normal range of motion. Neck supple. No JVD present. Carotid bruit is not present. No tracheal deviation present. No thyromegaly present.  Cardiovascular: Normal rate, regular rhythm, normal heart sounds and intact distal pulses.  Exam reveals no gallop.   Pulmonary/Chest: Effort normal and breath sounds normal. No respiratory distress. She has no wheezes.  Abdominal: Soft. Bowel sounds are normal. She exhibits no distension, no abdominal bruit and no mass. There is no tenderness.  Genitourinary: No breast swelling, tenderness, discharge or bleeding.       Breast exam: No mass, nodules, thickening, tenderness, bulging, retraction, inflamation, nipple discharge or skin changes noted.  No axillary or clavicular LA.  Chaperoned exam.    Musculoskeletal:  Normal range of motion. She exhibits no edema and no tenderness.  Lymphadenopathy:    She has no cervical adenopathy.  Neurological: She is alert. She has normal reflexes. No cranial nerve deficit. She exhibits normal muscle tone. Coordination normal.  Skin: Skin is warm and dry. No rash noted. No erythema. No pallor.  Psychiatric: She has a normal mood and affect.          Assessment & Plan:

## 2011-10-18 NOTE — Patient Instructions (Addendum)
Flu shot today We will schedule mammogram at check out  Labs today Try to take in more calories  We will do derm ref at check out Keep checking blood pressure at home and follow up in about a month with your cuff and log of readings

## 2011-10-20 ENCOUNTER — Telehealth: Payer: Self-pay | Admitting: Family Medicine

## 2011-10-20 DIAGNOSIS — R9389 Abnormal findings on diagnostic imaging of other specified body structures: Secondary | ICD-10-CM

## 2011-10-20 NOTE — Assessment & Plan Note (Signed)
Scheduled annual screening mammogram Nl breast exam today  Encouraged monthly self exams   

## 2011-10-20 NOTE — Assessment & Plan Note (Signed)
Reviewed health habits including diet and exercise and skin cancer prevention Also reviewed health mt list, fam hx and immunizations  Labs drawn today

## 2011-10-20 NOTE — Assessment & Plan Note (Signed)
Lab today On zocor and diet  Rev low sat fat diet

## 2011-10-20 NOTE — Assessment & Plan Note (Signed)
bp is up today ? whitecoat Will f/u 1 mo and bring cuff and record from home May need to adj med  Disc lifestyle habits as well

## 2011-10-20 NOTE — Assessment & Plan Note (Signed)
Lab today  Has been controlled in past with good low glycemic diet Pt is loosing weight however

## 2011-10-20 NOTE — Telephone Encounter (Signed)
Please let pt know her cxr shows som thickening of pleura (outside of the lungs)- I suspect that this is from her previous illness but a CT scan with contrast is recommended  Also small pulmonary nodule  She needs CT of chest with contrast to clarify I will go ahead and ref- let her know that Shirlee Limerick will be calling her

## 2011-10-20 NOTE — Assessment & Plan Note (Signed)
cxr today mammo planned Pt is former smoker Need to r/o neoplastic source Could also be from busier schedule lately

## 2011-10-20 NOTE — Assessment & Plan Note (Signed)
Ref to derm for regular exam Pt has hx of basal cell carcinoma in past Disc sun protection

## 2011-10-21 ENCOUNTER — Telehealth: Payer: Self-pay | Admitting: *Deleted

## 2011-10-21 DIAGNOSIS — Z1211 Encounter for screening for malignant neoplasm of colon: Secondary | ICD-10-CM | POA: Insufficient documentation

## 2011-10-21 DIAGNOSIS — D649 Anemia, unspecified: Secondary | ICD-10-CM

## 2011-10-21 NOTE — Telephone Encounter (Signed)
Message copied by Judy Pimple on Mon Oct 21, 2011  5:16 PM ------      Message from: Shon Millet      Created: Mon Oct 21, 2011  3:27 PM       Notified pt she needs to pick up IFOB stool card, pt said she will stop by tomorrow afternoon to pick it up

## 2011-10-21 NOTE — Telephone Encounter (Signed)
Thanks, I will go ahead and put the order in , Terri already put it up front for her to pick up

## 2011-10-21 NOTE — Telephone Encounter (Signed)
Notified pt of results and that a CT was recommended and Shirlee Limerick will be calling her shortly

## 2011-10-23 ENCOUNTER — Ambulatory Visit (INDEPENDENT_AMBULATORY_CARE_PROVIDER_SITE_OTHER)
Admission: RE | Admit: 2011-10-23 | Discharge: 2011-10-23 | Disposition: A | Discharge status: Home / Self Care | Payer: BC Managed Care – PPO | Source: Ambulatory Visit | Attending: Family Medicine | Admitting: Family Medicine

## 2011-10-23 DIAGNOSIS — R918 Other nonspecific abnormal finding of lung field: Secondary | ICD-10-CM

## 2011-10-23 DIAGNOSIS — R9389 Abnormal findings on diagnostic imaging of other specified body structures: Secondary | ICD-10-CM

## 2011-10-23 MED ORDER — IOHEXOL 300 MG/ML  SOLN
80.0000 mL | Freq: Once | INTRAMUSCULAR | Status: AC | PRN
  Administered 2011-10-23: 80 mL via INTRAVENOUS

## 2011-10-25 ENCOUNTER — Encounter: Payer: Self-pay | Admitting: Family Medicine

## 2011-10-25 ENCOUNTER — Telehealth: Payer: Self-pay | Admitting: Family Medicine

## 2011-10-25 DIAGNOSIS — J949 Pleural condition, unspecified: Secondary | ICD-10-CM

## 2011-10-25 DIAGNOSIS — I712 Thoracic aortic aneurysm, without rupture: Secondary | ICD-10-CM

## 2011-10-25 DIAGNOSIS — R9389 Abnormal findings on diagnostic imaging of other specified body structures: Secondary | ICD-10-CM

## 2011-10-25 NOTE — Telephone Encounter (Signed)
Patient called back after seeing her results on Mychart and wants you to know that  She does want to see a lung Dr and a Vascular Dr as well. I called [facility] and they said she would have to see a Vascular surgeon not one of their Cardiologists.

## 2011-10-25 NOTE — Telephone Encounter (Signed)
I will go ahead with those referrals  Shirlee Limerick- just to let you know---thanks

## 2011-10-29 DIAGNOSIS — R918 Other nonspecific abnormal finding of lung field: Secondary | ICD-10-CM

## 2011-10-29 HISTORY — DX: Other nonspecific abnormal finding of lung field: R91.8

## 2011-11-05 ENCOUNTER — Ambulatory Visit (INDEPENDENT_AMBULATORY_CARE_PROVIDER_SITE_OTHER)
Admission: RE | Admit: 2011-11-05 | Discharge: 2011-11-05 | Disposition: A | Discharge status: Home / Self Care | Payer: BC Managed Care – PPO | Source: Ambulatory Visit | Attending: Pulmonary Disease | Admitting: Pulmonary Disease

## 2011-11-05 ENCOUNTER — Institutional Professional Consult (permissible substitution): Payer: BC Managed Care – PPO | Admitting: Pulmonary Disease

## 2011-11-05 ENCOUNTER — Telehealth: Payer: Self-pay | Admitting: Pulmonary Disease

## 2011-11-05 ENCOUNTER — Ambulatory Visit (INDEPENDENT_AMBULATORY_CARE_PROVIDER_SITE_OTHER): Payer: BC Managed Care – PPO | Admitting: Pulmonary Disease

## 2011-11-05 ENCOUNTER — Encounter: Payer: Self-pay | Admitting: Pulmonary Disease

## 2011-11-05 VITALS — BP 168/72 | HR 78 | Temp 97.9°F | Ht 67.5 in | Wt 119.8 lb

## 2011-11-05 DIAGNOSIS — J449 Chronic obstructive pulmonary disease, unspecified: Secondary | ICD-10-CM

## 2011-11-05 DIAGNOSIS — R091 Pleurisy: Secondary | ICD-10-CM

## 2011-11-05 DIAGNOSIS — R9389 Abnormal findings on diagnostic imaging of other specified body structures: Secondary | ICD-10-CM

## 2011-11-05 DIAGNOSIS — J949 Pleural condition, unspecified: Secondary | ICD-10-CM

## 2011-11-05 DIAGNOSIS — Z87891 Personal history of nicotine dependence: Secondary | ICD-10-CM

## 2011-11-05 DIAGNOSIS — R918 Other nonspecific abnormal finding of lung field: Secondary | ICD-10-CM

## 2011-11-05 MED ORDER — ALBUTEROL SULFATE HFA 108 (90 BASE) MCG/ACT IN AERS: 2.0000 | INHALATION_SPRAY | Freq: Four times a day (QID) | RESPIRATORY_TRACT | Status: DC | PRN

## 2011-11-05 MED ORDER — TIOTROPIUM BROMIDE MONOHYDRATE 18 MCG IN CAPS: 18.0000 ug | ORAL_CAPSULE | Freq: Every day | RESPIRATORY_TRACT | Status: DC

## 2011-11-05 NOTE — Progress Notes (Signed)
Subjective:    Patient ID: Pamela Levine, female    DOB: [birth year], 66 y.o.   MRN: [id number]  HPI Ms. Galas is a 66 yo woman with PMH significant for 25 pack year smoking, quit in 2010; She presents today to clinic for pulmonary evaluation of an abnormal finding on a recent CT chest 10/23/11. She presented to her PCP office for the annual visit September 2013 and was noted a significant weight los. Workup included a CXR, which showed pleural thickening in the R apex; follow up CT chest showed RUL mass vs R apex pleural thickening.  It was also noted that she had a thoracic aortic aneurysm 4.1 cm.   She denies dyspnea or cough, denies hemoptysis. However she reports severe colds once a year that sometimes require a visit to the doctor for antibiotic treatment. Last cold in August of this year. The symptoms usually persist for up to several weeks. Has not required hospitalization or ED visits.   She has been on a prn inhaler years back. Not taking one recently.   She denies chest pain or LE edema. Denies paroxysmal nocturnal dyspnea or wheezing.      Past Medical History  Diagnosis Date  . Hyperlipidemia   . Hypertension   . History  of basal cell carcinoma   . Light cigarette smoker   . Kidney stone 01/2000    stent. Renal U/S normal in 12/01.   Past Surgical History  Procedure Date  . Partial hysterectomy 1978    bleeding  . Kidney stone surgery 01/2000    stent   No Known Allergies  History   Social History  . Marital Status: Married    Spouse Name: N/A    Number of Children: N/A  . Years of Education: N/A   Occupational History  .  [employer]   Social History Main Topics  . Smoking status: Former Smoker -- 1.0 packs/day for 25 years    Types: Cigarettes    Quit date: 01/29/2007  . Smokeless tobacco: None  . Alcohol Use: Yes     occasional  . Drug Use: None  . Sexually Active: None   Other Topics Concern  . None   Social History Narrative  . None    Social history: worked in [employer]; has lived all her life in [place]; has no pets; is not aware of TB exposure.  Hobbies: the patient likes gardening;    Current Outpatient Prescriptions  Medication Sig Dispense Refill  . amLODipine (NORVASC) 10 MG tablet Take 1 tablet (10 mg total) by mouth daily.  90 tablet  3  . aspirin 81 MG chewable tablet Chew 81 mg by mouth daily.        . Calcium Carbonate-Vitamin D (CALTRATE 600+D) 600-400 MG-UNIT per tablet Take 2 tablets by mouth daily.        Marland Kitchen losartan-hydrochlorothiazide (HYZAAR) 100-12.5 MG per tablet Take 1 tablet by mouth daily.  90 tablet  3  . Multiple Vitamins-Minerals (CENTRUM SILVER PO) Take by mouth daily.        Marland Kitchen albuterol (PROVENTIL HFA;VENTOLIN HFA) 108 (90 BASE) MCG/ACT inhaler Inhale 2 puffs into the lungs every 6 (six) hours as needed for wheezing.  1 Inhaler  6  . simvastatin (ZOCOR) 20 MG tablet Take 1 tablet (20 mg total) by mouth daily.  90 tablet  3  . tiotropium (SPIRIVA) 18 MCG inhalation capsule Place 1 capsule (18 mcg total) into inhaler and inhale daily.  30  capsule  6     Review of Systems  Constitutional: Positive for unexpected weight change. Negative for fever and appetite change.  HENT: Negative for ear pain, congestion, sore throat, sneezing, trouble swallowing and dental problem.   Respiratory: Negative for cough and shortness of breath.   Cardiovascular: Negative for chest pain, palpitations and leg swelling.  Gastrointestinal: Negative for abdominal pain.  Musculoskeletal: Negative for joint swelling.  Skin: Negative for rash.  Neurological: Negative for headaches.  Psychiatric/Behavioral: Negative for dysphoric mood. The patient is not nervous/anxious.        Objective:   Physical Exam BP 168/72  Pulse 78  Temp 97.9 F (36.6 C) (Oral)  Ht 5' 7.5" (1.715 m)  Wt 54.341 kg (119 lb 12.8 oz)  BMI 18.49 kg/m2  SpO2 98%  LMP 01/29/1976 General: Comfortable, thin in no acute distress HEENT ; pupils  round and reactive to light; nasal mucosa without edema and erythema; retropharynx with no erythema, edema or exudate; LAD no cervical, supraclavicular or axillar  LAD  Cardiovascular: s1s2, no murmurs. Regular rate and rhythm.  Respiratory: Decreased breath sounds bilaterally without wheezing rhonchi or crackles No increased work of breathing.  Abdomen: soft NT ND BS+.   Extremities: no pedal edema. Homans negative  Musculoskeletal osteoarthritis changes  Central nervous system: Alert and oriented No focal neurological deficits.  Results for orders placed in visit on 10/18/11  CBC WITH DIFFERENTIAL      Component Value Range   WBC 7.3  4.5 - 10.5 K/uL   RBC 3.91  3.87 - 5.11 Mil/uL   Hemoglobin 11.6 (*) 12.0 - 15.0 g/dL   HCT 16.1 (*) 09.6 - 04.5 %   MCV 89.2  78.0 - 100.0 fl   MCHC 33.3  30.0 - 36.0 g/dL   RDW 40.9 (*) 81.1 - 91.4 %   Platelets 315.0  150.0 - 400.0 K/uL   Neutrophils Relative 60.8  43.0 - 77.0 %   Lymphocytes Relative 24.1  12.0 - 46.0 %   Monocytes Relative 6.8  3.0 - 12.0 %   Eosinophils Relative 7.0 (*) 0.0 - 5.0 %   Basophils Relative 1.3  0.0 - 3.0 %   Neutro Abs 4.4  1.4 - 7.7 K/uL   Lymphs Abs 1.8  0.7 - 4.0 K/uL   Monocytes Absolute 0.5  0.1 - 1.0 K/uL   Eosinophils Absolute 0.5  0.0 - 0.7 K/uL   Basophils Absolute 0.1  0.0 - 0.1 K/uL  COMPREHENSIVE METABOLIC PANEL      Component Value Range   Sodium 136  135 - 145 mEq/L   Potassium 3.7  3.5 - 5.1 mEq/L   Chloride 98  96 - 112 mEq/L   CO2 30  19 - 32 mEq/L   Glucose, Bld 109 (*) 70 - 99 mg/dL   BUN 25 (*) 6 - 23 mg/dL   Creatinine, Ser 1.1  0.4 - 1.2 mg/dL   Total Bilirubin 0.5  0.3 - 1.2 mg/dL   Alkaline Phosphatase 78  39 - 117 U/L   AST 23  0 - 37 U/L   ALT 19  0 - 35 U/L   Total Protein 7.8  6.0 - 8.3 g/dL   Albumin 3.7  3.5 - 5.2 g/dL   Calcium 9.8  8.4 - 78.2 mg/dL   GFR 95.62 (*) >13.08 mL/min  TSH      Component Value Range   TSH 1.40  0.35 - 5.50 uIU/mL  HEMOGLOBIN A1C  Component Value Range   Hemoglobin A1C 6.0  4.6 - 6.5 %  LIPID PANEL      Component Value Range   Cholesterol 178  0 - 200 mg/dL   Triglycerides 95.6  0.0 - 149.0 mg/dL   HDL 21.30  >86.57 mg/dL   VLDL 84.6  0.0 - 96.2 mg/dL   LDL Cholesterol 952 (*) 0 - 99 mg/dL   Total CHOL/HDL Ratio 4     CT chest 10/23/11 personally reviewed  IMPRESSION:  1. Asymmetric right apical pleural parenchymal opacification with  a possible area of nodular soft tissue medially. Borderline  enlarged right hilar lymph node. Further evaluation with PET is  recommended. Right hilar lymph nodes measure up to 11 mm short axis.  2. Scattered areas of amorphous peribronchovascular airspace  disease which may represent resolving infection.  3. Ascending aortic aneurysm 4.1 cm. Extensive coronary artery  calcification.   CXR 11/05/11 personally reviewed  COPD.  Apical pleural thickening bilaterally right greater than left.  Differential diagnosis includes scarring versus a right apical  neoplasm.  Spirometry 11/05/2011  FEV1 1.69L 64% FVC 2.73L 79%  FEV1/FVC 62    Assessment & Plan:   1. RUL mass vs R apical pleural thickening with R hilar lymphadenopathy. The patient with long time history of smoking, family history of lung cancer and recent weight los. DDx include cancer vs infection (especially NTM or fungal disease; she has no risk factors for TB) vs other (autoimmune process). CXR today with no improvement or worsening of mass. I am considering repeating a CT chest vs performing a PET. Given no change in the CXR I will recommend PET with next visit. If PET negative we cannot entirely exclude cancer but it becomes less likely, if PET positive the ddx still includes an infectious process but could help in staging. Given the R hilar lymph node, if positive PET we will recommend EBUS/FOB for microbiology and TBNA.   2. Moderate COPD; currently minimally symptomatic per patients report except for once a year  exacerbation, reported as bad colds, persisting for several weeks. Predominant apical emphysema. The exacerbation usually occurs during the summer.  I will initiate Spiriva and advise to take prn albuterol mainly to decrease her risk of exacerbation, given that the exacerbation occurs usually during the summer,  Spiriva might benefit more if taking June throught September. I will revaluate her symptoms with a CAT at next visit. She might also underestimate her symptoms.   3. Weight los further investigation per primary care physician/   4. General health; received the flu vaccine, received the pneumococcus vaccine; clonoscopy negative last year. Mammogram pending.   5. Aortic Aneurysm Differ to vascular surgery. If surgery would be necessary in the future for correction of aneurysm, based on FEV1 alone, COPD and lung disease would not be a contraindication; However we will obtain complete PFTs with next visit.   F/U in 2-3 weeks after PET scan obtained.

## 2011-11-05 NOTE — Patient Instructions (Addendum)
We will follow up in 2-3 weeks to discuss the results of repeat CT chest.   We will obtain a CXR today.   Start Albuterol and use it as needed for dyspnea. Please initiate Spiriva daily.

## 2011-11-05 NOTE — Telephone Encounter (Signed)
Please dc the CT scan and obtain PET before next visit. Please obtain full PFTs including DLCO with next visit. Please inform the patient on changes in current plan. thank you

## 2011-11-06 ENCOUNTER — Telehealth: Payer: Self-pay | Admitting: Pulmonary Disease

## 2011-11-06 ENCOUNTER — Other Ambulatory Visit: Payer: BC Managed Care – PPO

## 2011-11-06 DIAGNOSIS — D649 Anemia, unspecified: Secondary | ICD-10-CM

## 2011-11-06 DIAGNOSIS — Z1211 Encounter for screening for malignant neoplasm of colon: Secondary | ICD-10-CM

## 2011-11-06 LAB — FECAL OCCULT BLOOD, IMMUNOCHEMICAL: Fecal Occult Bld: NEGATIVE

## 2011-11-06 NOTE — Telephone Encounter (Signed)
lmomtcb x 2  

## 2011-11-06 NOTE — Telephone Encounter (Signed)
Error

## 2011-11-06 NOTE — Telephone Encounter (Signed)
I have placed order for PET scan and PFT. I have also called and cancelled CT scan.  lmomtcb x1 for pt

## 2011-11-07 NOTE — Telephone Encounter (Signed)
lmomtcb x 2  

## 2011-11-07 NOTE — Telephone Encounter (Signed)
lmomtcb x1 

## 2011-11-07 NOTE — Telephone Encounter (Signed)
Pt returned call. She also requests to speak to nurse re: PET appt. Call before 3pm today. Hazel Sams

## 2011-11-08 ENCOUNTER — Telehealth: Payer: Self-pay | Admitting: Pulmonary Disease

## 2011-11-08 NOTE — Telephone Encounter (Signed)
I spoke with pt and is aware of Dr. Liliane Channel recs. Nothing further was needed

## 2011-11-08 NOTE — Telephone Encounter (Signed)
I attempted to call the patient to explain the reason for PET. There was no answer.

## 2011-11-08 NOTE — Telephone Encounter (Signed)
Pt is already aware of reason since she already had CT and recs were to have PET scan. She was fine with this. Thanks

## 2011-11-08 NOTE — Telephone Encounter (Signed)
Thank you,

## 2011-11-11 NOTE — Telephone Encounter (Signed)
Pt called back. [phone]. Pamela Levine

## 2011-11-12 ENCOUNTER — Institutional Professional Consult (permissible substitution) (INDEPENDENT_AMBULATORY_CARE_PROVIDER_SITE_OTHER): Payer: BC Managed Care – PPO | Admitting: Surgery

## 2011-11-12 ENCOUNTER — Other Ambulatory Visit: Payer: Self-pay | Admitting: Surgery

## 2011-11-12 ENCOUNTER — Encounter: Payer: Self-pay | Admitting: Surgery

## 2011-11-12 ENCOUNTER — Inpatient Hospital Stay: Admission: RE | Admit: 2011-11-12 | Payer: BC Managed Care – PPO | Source: Ambulatory Visit

## 2011-11-12 VITALS — BP 183/81 | HR 90 | Resp 20 | Ht 67.5 in | Wt 118.0 lb

## 2011-11-12 DIAGNOSIS — I712 Thoracic aortic aneurysm, without rupture: Secondary | ICD-10-CM

## 2011-11-12 NOTE — Progress Notes (Signed)
[address]            Pamela Levine [address]          [phone]       PCP is [name], MD Referring Provider is Tower, Audrie Gallus, MD  Chief Complaint  Patient presents with  . Thoracic Aortic Aneurysm    Referral from Dr Milinda Antis for eval on ascending aortic aneurysm      HPI:  The patient is a 66 year old former smoker who was recently seen by Dr. Milinda Antis for routine followup and related a 10 pound weight loss over the past year. Given her smoking history a chest x-ray was performed which showed asymmetric apical pleural thickening and a CT scan of the chest was recommended. CT scan of the chest dated 10/23/2011 showed biapical pleural parenchymal opacification that was greater on the right than the left with some associated calcification bilaterally and the suggestion that there may be a slightly nodular soft tissue component on the right. There was a borderline enlarged right hilar lymph node. The scan also showed a 4.1 cm fusiform ascending aortic aneurysm as well as extensive calcification of the coronary arteries. There is also diffuse calcific plaque noted throughout the thoracic and abdominal aorta.  The patient said that she feels fine. She did have a prolonged upper respiratory infection this past summer which lasted about 4 weeks.  Past Medical History  Diagnosis Date  . Hyperlipidemia   . Hypertension   . History  of basal cell carcinoma   . Light cigarette smoker   . Kidney stone 01/2000    stent. Renal U/S normal in 12/01.    Past Surgical History  Procedure Date  . Partial hysterectomy 1978    bleeding  . Kidney stone surgery 01/2000    stent    Family History  Problem Relation Age of Onset  . Lung cancer Mother     lung- non smoker  . Hypertension Father   . Stroke Father     cerebral hemorrhage  . Lung cancer Sister     lung  . Diabetes Son     Social History History  Substance Use Topics  . Smoking status: Former  Smoker -- 1.0 packs/day for 25 years    Types: Cigarettes    Quit date: 01/29/2007  . Smokeless tobacco: Not on file  . Alcohol Use: Yes     occasional    Current Outpatient Prescriptions  Medication Sig Dispense Refill  . albuterol (PROVENTIL HFA;VENTOLIN HFA) 108 (90 BASE) MCG/ACT inhaler Inhale 2 puffs into the lungs every 6 (six) hours as needed for wheezing.  1 Inhaler  6  . amLODipine (NORVASC) 10 MG tablet Take 1 tablet (10 mg total) by mouth daily.  90 tablet  3  . aspirin 81 MG chewable tablet Chew 81 mg by mouth daily.        . Calcium Carbonate-Vitamin D (CALTRATE 600+D) 600-400 MG-UNIT per tablet Take 2 tablets by mouth daily.        Marland Kitchen losartan-hydrochlorothiazide (HYZAAR) 100-12.5 MG per tablet Take 1 tablet by mouth daily.  90 tablet  3  . Multiple Vitamins-Minerals (CENTRUM SILVER PO) Take by mouth daily.        . simvastatin (ZOCOR) 20 MG tablet Take 1 tablet (20 mg total) by mouth daily.  90 tablet  3  . tiotropium (SPIRIVA) 18 MCG inhalation capsule  Place 1 capsule (18 mcg total) into inhaler and inhale daily.  30 capsule  6    No Known Allergies  Review of Systems  Constitutional: Positive for unexpected weight change. Negative for fever, diaphoresis, activity change, appetite change and fatigue.  HENT: Negative.   Eyes: Positive for visual disturbance.       Since starting on Spiriva.  Respiratory: Negative for cough and shortness of breath.   Cardiovascular:       Pain and tiredness in both legs with ambulation.  Gastrointestinal: Negative.   Genitourinary: Negative.   Musculoskeletal: Negative.   Neurological: Negative.   Hematological: Negative.   Psychiatric/Behavioral: Negative.     BP 183/81  Pulse 90  Resp 20  Ht 5' 7.5" (1.715 m)  Wt 118 lb (53.524 kg)  BMI 18.21 kg/m2  SpO2 96%  LMP 01/29/1976 Physical Exam  Constitutional: She is oriented to person, place, and time. She appears well-developed and well-nourished. No distress.  HENT:  Head:  Normocephalic and atraumatic.  Mouth/Throat: Oropharynx is clear and moist. No oropharyngeal exudate.  Eyes: Conjunctivae normal and EOM are normal. Pupils are equal, round, and reactive to light.  Neck: Normal range of motion. Neck supple. No JVD present. No tracheal deviation present. No thyromegaly present.  Cardiovascular: Normal rate and regular rhythm.        3/6 harsh systolic murmur over aorta radiating into the neck bilaterally.  Pulmonary/Chest: Effort normal and breath sounds normal. She exhibits no tenderness.  Abdominal: Soft. Bowel sounds are normal. She exhibits no distension and no mass. There is no tenderness.  Musculoskeletal: Normal range of motion. She exhibits no edema.  Lymphadenopathy:    She has no cervical adenopathy.  Neurological: She is alert and oriented to person, place, and time. She has normal strength. No cranial nerve deficit or sensory deficit.  Skin: Skin is warm and dry.  Psychiatric: She has a normal mood and affect.     Diagnostic Tests:  *RADIOLOGY REPORT*   Clinical Data: Unintentional weight loss.  Prior smoker.   CHEST - 2 VIEW   Comparison: No priors are available for comparison.   Findings: Lung volumes are within normal limits.  Bilateral apical pleural parenchymal thickening (right greater than left).  Some interstitial prominence in the periphery of the right lung, with the suggestion of a small nodule the periphery of the right mid lung (overlapping the lateral aspect of the right fifth rib).  No acute consolidative airspace disease.  No pleural effusions.  The pulmonary vasculature and cardiomediastinal silhouette are within normal limits.  Atherosclerosis within the thoracic aorta.   IMPRESSION: 1.  Asymmetric bilateral apical pleuroparenchymal thickening. While this may simply represent post infectious/inflammatory scarring, the asymmetry in this patient with history of smoking and unintentional weight loss could be  concerning for potential neoplasm.  In addition, there appears to be a small nodule the periphery of the right lung.  For further evaluation all of these findings, a contrast enhanced CT of the thorax is recommended at this time. 2.  Atherosclerosis.   These results will be called to the ordering clinician or representative by the Radiologist Assistant, and communication documented in the PACS Dashboard.     Original Report Authenticated By: Florencia Reasons, M.D. *RADIOLOGY REPORT*   Clinical Data: Abnormal chest radiograph with asymmetric apical pleural thickening.  Ex-smoker.   CT CHEST WITH CONTRAST   Technique:  Multidetector CT imaging of the chest was performed following the standard protocol during bolus administration of  intravenous contrast.   Contrast: 80mL OMNIPAQUE IOHEXOL 300 MG/ML  SOLN   Comparison: Chest radiograph 10/18/2011.   Findings: Right hilar lymph nodes measure up to 11 mm short axis. Mediastinal lymph nodes are not enlarged by CT size criteria.  No left hilar or axillary adenopathy.  Atherosclerotic calcification of the arterial vasculature, including extensive involvement of the coronary arteries.  Ascending aorta measures up to 4.1 cm.  Heart size normal.  No pericardial effusion.   Biapical pleural parenchymal opacification, right greater than left.  There is associated calcification bilaterally.  That seen on the right may have a slightly nodular soft tissue component medially, best seen on coronal image 53, measuring 1.7 cm. Centrilobular and paraseptal emphysema.  Amorphous peribronchovascular and subpleural air space densities are seen in the peripheral right upper lobe (example image 28).  Similar air space densities are seen in the inferior left lower lobe (example image 52).  No pleural fluid.  Airway is unremarkable.   Incidental imaging of the upper abdomen shows no acute findings. No worrisome lytic or sclerotic lesions.   Degenerative changes are seen in the spine.   IMPRESSION:   1.  Asymmetric right apical pleural parenchymal opacification with a possible area of nodular soft tissue medially. Borderline enlarged right hilar lymph node.  Further evaluation with PET is recommended. 2.  Scattered areas of amorphous peribronchovascular airspace disease which may represent resolving infection. 3.  Ascending aortic aneurysm.  Extensive coronary artery calcification.     Original Report Authenticated By: Reyes Ivan, M.D.    Impression:  1. Biapical pleural parenchymal opacification that may have a slightly nodular soft tissue component on the right. This could be infectious or inflammatory but given her smoking history and weight loss I think would be best to proceed with a PET scan to assess this further. This has already been scheduled.  2. 4.1 cm fusiform ascending aortic aneurysm: It is unclear how long this has been present. She does have a systolic heart murmur that sounds like it is coming from the aortic valve and it is possible she could have bicuspid aortic valve disease which is commonly associated with ascending aortic aneurysms. We generally don't recommend operating on ascending aortic aneurysms until they reach 5.5 cm or grow 1 cm in a year. I have recommended that she have a repeat CT angiogram of the chest in one year to followup on this.  3. Systolic heart murmur suggesting aortic valve disease. I recommended that we obtain an echocardiogram to further assess this. She said that she has had a heart murmur most of her life and she certainly could have bicuspid aortic valve disease. It has never been investigated.  4. Extensive coronary artery calcification noted on CT scan. Given her risk factors for heart disease and the presence of diffuse aortic atherosclerosis this will require continued followup looking for any symptoms to suggest ischemia. At the present time she denies any  symptoms.  Plan:  She already has a PET scan scheduled. I will schedule a 2-D echocardiogram. I will see her back when they have been completed to discuss the results with her and her family and make further plans after that.

## 2011-11-14 ENCOUNTER — Ambulatory Visit (HOSPITAL_COMMUNITY)
Admission: RE | Admit: 2011-11-14 | Discharge: 2011-11-14 | Disposition: A | Discharge status: Home / Self Care | Payer: BC Managed Care – PPO | Source: Ambulatory Visit | Attending: Pulmonary Disease | Admitting: Pulmonary Disease

## 2011-11-14 ENCOUNTER — Encounter (HOSPITAL_COMMUNITY): Payer: Self-pay

## 2011-11-14 DIAGNOSIS — E785 Hyperlipidemia, unspecified: Secondary | ICD-10-CM | POA: Insufficient documentation

## 2011-11-14 DIAGNOSIS — R599 Enlarged lymph nodes, unspecified: Secondary | ICD-10-CM | POA: Insufficient documentation

## 2011-11-14 DIAGNOSIS — J4489 Other specified chronic obstructive pulmonary disease: Secondary | ICD-10-CM | POA: Insufficient documentation

## 2011-11-14 DIAGNOSIS — R222 Localized swelling, mass and lump, trunk: Secondary | ICD-10-CM | POA: Insufficient documentation

## 2011-11-14 DIAGNOSIS — R7309 Other abnormal glucose: Secondary | ICD-10-CM | POA: Insufficient documentation

## 2011-11-14 DIAGNOSIS — Z87891 Personal history of nicotine dependence: Secondary | ICD-10-CM | POA: Insufficient documentation

## 2011-11-14 DIAGNOSIS — I10 Essential (primary) hypertension: Secondary | ICD-10-CM | POA: Insufficient documentation

## 2011-11-14 DIAGNOSIS — J449 Chronic obstructive pulmonary disease, unspecified: Secondary | ICD-10-CM

## 2011-11-14 MED ORDER — FLUDEOXYGLUCOSE F - 18 (FDG) INJECTION: 1215.0000 | Freq: Once | INTRAVENOUS | Status: DC | PRN

## 2011-11-14 MED ORDER — FLUDEOXYGLUCOSE F - 18 (FDG) INJECTION
14.6000 | Freq: Once | INTRAVENOUS | Status: AC | PRN
  Administered 2011-11-14: 14.6 via INTRAVENOUS

## 2011-11-15 LAB — GLUCOSE, CAPILLARY: Glucose-Capillary: 112 mg/dL — ABNORMAL HIGH (ref 70–99)

## 2011-11-18 ENCOUNTER — Other Ambulatory Visit (INDEPENDENT_AMBULATORY_CARE_PROVIDER_SITE_OTHER): Payer: BC Managed Care – PPO

## 2011-11-18 ENCOUNTER — Other Ambulatory Visit: Payer: Self-pay

## 2011-11-18 DIAGNOSIS — I712 Thoracic aortic aneurysm, without rupture: Secondary | ICD-10-CM

## 2011-11-20 ENCOUNTER — Other Ambulatory Visit: Payer: Self-pay | Admitting: Emergency Medicine

## 2011-11-20 DIAGNOSIS — R590 Localized enlarged lymph nodes: Secondary | ICD-10-CM

## 2011-11-20 DIAGNOSIS — R918 Other nonspecific abnormal finding of lung field: Secondary | ICD-10-CM

## 2011-11-22 ENCOUNTER — Encounter (HOSPITAL_COMMUNITY): Payer: Self-pay | Admitting: Pharmacy Technician

## 2011-11-22 ENCOUNTER — Ambulatory Visit (INDEPENDENT_AMBULATORY_CARE_PROVIDER_SITE_OTHER): Payer: BC Managed Care – PPO | Admitting: Family Medicine

## 2011-11-22 ENCOUNTER — Encounter: Payer: Self-pay | Admitting: Family Medicine

## 2011-11-22 VITALS — BP 162/72 | HR 94 | Temp 97.2°F | Ht 67.5 in | Wt 117.0 lb

## 2011-11-22 DIAGNOSIS — I712 Thoracic aortic aneurysm, without rupture: Secondary | ICD-10-CM

## 2011-11-22 DIAGNOSIS — R222 Localized swelling, mass and lump, trunk: Secondary | ICD-10-CM

## 2011-11-22 DIAGNOSIS — D649 Anemia, unspecified: Secondary | ICD-10-CM

## 2011-11-22 DIAGNOSIS — I10 Essential (primary) hypertension: Secondary | ICD-10-CM

## 2011-11-22 DIAGNOSIS — R918 Other nonspecific abnormal finding of lung field: Secondary | ICD-10-CM | POA: Insufficient documentation

## 2011-11-22 MED ORDER — OLMESARTAN MEDOXOMIL-HCTZ 40-25 MG PO TABS: 1.0000 | ORAL_TABLET | Freq: Every day | ORAL | Status: DC

## 2011-11-22 NOTE — Patient Instructions (Addendum)
Keep follow ups with your specialists  I will follow your progress  Get your mammogram on Friday as planned Continue your iron  Follow up with me in about 6-8 weeks for blood pressure  Change from hyzaar (losartan) to benicar- I sent this to [facility] - if problems let me know  We need to get your blood pressure down a bit

## 2011-11-22 NOTE — Progress Notes (Signed)
Subjective:    Patient ID: Pamela Levine, female    DOB: 1945/05/09, 66 y.o.   MRN: [id number]  HPI Here for f/u of chronic conditions incl HTN   Since last visit dx with AAA- sees vascular and they are watching since it is 4.1 mm Bicuspid aortic valve  Dr Laneta Simmers  No symptoms  Also dx with R apical lung mass  Has procedure upcoming for that - will get an MRI  Also a breathing test  Sees Dr Laneta Simmers and also pulmonary  Is on spiriva now   She has seen a change in her eyes with that - will disc with pulmonary  She does see the eye doctor yearly  Has chronic dry eyes  ? If glaucoma suspect    Labs were overall stable  Wt is stable this visit  Has been eating like a pig   bp is stable today  No cp or palpitations or headaches or edema  No side effects to medicines  BP Readings from Last 3 Encounters:  11/22/11 162/72  11/12/11 183/81  11/05/11 168/72       Chemistry      Component Value Date/Time   NA 136 10/18/2011 1109   K 3.7 10/18/2011 1109   CL 98 10/18/2011 1109   CO2 30 10/18/2011 1109   BUN 25* 10/18/2011 1109   CREATININE 1.1 10/18/2011 1109      Component Value Date/Time   CALCIUM 9.8 10/18/2011 1109   ALKPHOS 78 10/18/2011 1109   AST 23 10/18/2011 1109   ALT 19 10/18/2011 1109   BILITOT 0.5 10/18/2011 1109     Lab Results  Component Value Date   CHOL 178 10/18/2011   HDL 47.00 10/18/2011   LDLCALC 112* 10/18/2011   TRIG 93.0 10/18/2011   CHOLHDL 4 10/18/2011     Was mildly anemic Lab Results  Component Value Date   WBC 7.3 10/18/2011   HGB 11.6* 10/18/2011   HCT 34.8* 10/18/2011   MCV 89.2 10/18/2011   PLT 315.0 10/18/2011   is taking iron -no problems or stomach issues   Patient Active Problem List  Diagnosis  . Malignant neoplasm of skin of parts of face  . HYPOGLYCEMIA, UNSPECIFIED  . HYPERLIPIDEMIA  . UNSPECIFIED ANEMIA  . HYPERTENSION  . ALLERGIC RHINITIS, SEASONAL  . HYPERGLYCEMIA  . TOBACCO USE, QUIT  . CARCINOMA, BASAL CELL, FACE  .  Heart murmur  . Routine general medical examination at a health care facility  . Weight loss, non-intentional  . Other screening mammogram  . Abnormal chest x-ray  . Colon cancer screening  . Ascending aortic aneurysm  . Pleural disorder  . COPD (chronic obstructive pulmonary disease)  . Mass of lung   Past Medical History  Diagnosis Date  . Hyperlipidemia   . Hypertension   . History  of basal cell carcinoma   . Light cigarette smoker   . Kidney stone 01/2000    stent. Renal U/S normal in 12/01.   Past Surgical History  Procedure Date  . Partial hysterectomy 1978    bleeding  . Kidney stone surgery 01/2000    stent   History  Substance Use Topics  . Smoking status: Former Smoker -- 25 years    Types: Cigarettes    Quit date: 01/29/2007  . Smokeless tobacco: Not on file  . Alcohol Use: Yes     occasional   Family History  Problem Relation Age of Onset  . Lung cancer Mother  lung- non smoker  . Hypertension Father   . Stroke Father     cerebral hemorrhage  . Lung cancer Sister     lung  . Diabetes Son    No Known Allergies Current Outpatient Prescriptions on File Prior to Visit  Medication Sig Dispense Refill  . albuterol (PROVENTIL HFA;VENTOLIN HFA) 108 (90 BASE) MCG/ACT inhaler Inhale 2 puffs into the lungs every 6 (six) hours as needed for wheezing.  1 Inhaler  6  . amLODipine (NORVASC) 10 MG tablet Take 1 tablet (10 mg total) by mouth daily.  90 tablet  3  . aspirin 81 MG chewable tablet Chew 81 mg by mouth daily.        . Calcium Carbonate-Vitamin D (CALTRATE 600+D) 600-400 MG-UNIT per tablet Take 2 tablets by mouth daily.        Marland Kitchen losartan-hydrochlorothiazide (HYZAAR) 100-12.5 MG per tablet Take 1 tablet by mouth daily.  90 tablet  3  . Multiple Vitamins-Minerals (CENTRUM SILVER PO) Take by mouth daily.        . simvastatin (ZOCOR) 20 MG tablet Take 1 tablet (20 mg total) by mouth daily.  90 tablet  3  . tiotropium (SPIRIVA) 18 MCG inhalation capsule  Place 1 capsule (18 mcg total) into inhaler and inhale daily.  30 capsule  6      Review of Systems Review of Systems  Constitutional: Negative for fever, appetite change, fatigue and unexpected weight change.  Eyes: Negative for pain and visual disturbance.  Respiratory: Negative for wheeze and shortness of breath.   Cardiovascular: Negative for cp or palpitations    Gastrointestinal: Negative for nausea, diarrhea and constipation.  Genitourinary: Negative for urgency and frequency.  Skin: Negative for pallor or rash   Neurological: Negative for weakness, light-headedness, numbness and headaches.  Hematological: Negative for adenopathy. Does not bruise/bleed easily.  Psychiatric/Behavioral: Negative for dysphoric mood. The patient is anxious about new health problems and the tests to come       Objective:   Physical Exam  Constitutional: She appears well-developed and well-nourished. No distress.       Slim and well appearing   HENT:  Head: Normocephalic and atraumatic.  Mouth/Throat: Oropharynx is clear and moist.  Eyes: Conjunctivae normal and EOM are normal. Pupils are equal, round, and reactive to light. Right eye exhibits no discharge. Left eye exhibits no discharge. No scleral icterus.  Neck: Normal range of motion. Neck supple. No JVD present. Carotid bruit is not present. No thyromegaly present.  Cardiovascular: Normal rate, regular rhythm, normal heart sounds and intact distal pulses.  Exam reveals no gallop.   Pulmonary/Chest: Effort normal and breath sounds normal. No respiratory distress. She has no wheezes. She has no rales.       Diffusely distant bs   Abdominal: Soft. Bowel sounds are normal. She exhibits no distension, no abdominal bruit and no mass. There is no tenderness.  Musculoskeletal: She exhibits no edema and no tenderness.  Lymphadenopathy:    She has no cervical adenopathy.  Neurological: She is alert. She has normal reflexes. No cranial nerve deficit.  She exhibits normal muscle tone. Coordination normal.  Skin: Skin is warm and dry. No rash noted. No erythema. No pallor.  Psychiatric: She has a normal mood and affect.          Assessment & Plan:

## 2011-11-24 NOTE — Assessment & Plan Note (Signed)
?   If related to her current lung mass dx  She is feeling better with iron  Will proceed with that w/u

## 2011-11-24 NOTE — Assessment & Plan Note (Signed)
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

## 2011-11-24 NOTE — Assessment & Plan Note (Signed)
bp is again elevated Rev meds - no side eff Some anx /apprehension given latest health issues Will change her losartan to benicar if insurance covers and see if that works better F/u planned Asked pt to watch bp as well and report any side effects or symptoms

## 2011-11-24 NOTE — Assessment & Plan Note (Signed)
Seeing Dr Laneta Simmers- observation right now given size Asymptomatic

## 2011-11-26 ENCOUNTER — Encounter: Payer: Self-pay | Admitting: Surgery

## 2011-11-26 ENCOUNTER — Encounter (HOSPITAL_COMMUNITY): Payer: Self-pay

## 2011-11-26 ENCOUNTER — Encounter (HOSPITAL_COMMUNITY)
Admission: RE | Admit: 2011-11-26 | Discharge: 2011-11-26 | Disposition: A | Discharge status: Home / Self Care | Payer: BC Managed Care – PPO | Source: Ambulatory Visit | Attending: Emergency Medicine | Admitting: Emergency Medicine

## 2011-11-26 ENCOUNTER — Ambulatory Visit (INDEPENDENT_AMBULATORY_CARE_PROVIDER_SITE_OTHER): Payer: BC Managed Care – PPO | Admitting: Surgery

## 2011-11-26 ENCOUNTER — Ambulatory Visit: Payer: Self-pay | Admitting: Family Medicine

## 2011-11-26 VITALS — BP 173/76 | HR 80 | Resp 18 | Ht 67.5 in | Wt 118.0 lb

## 2011-11-26 DIAGNOSIS — R011 Cardiac murmur, unspecified: Secondary | ICD-10-CM

## 2011-11-26 DIAGNOSIS — R918 Other nonspecific abnormal finding of lung field: Secondary | ICD-10-CM

## 2011-11-26 DIAGNOSIS — R222 Localized swelling, mass and lump, trunk: Secondary | ICD-10-CM

## 2011-11-26 DIAGNOSIS — I712 Thoracic aortic aneurysm, without rupture: Secondary | ICD-10-CM

## 2011-11-26 HISTORY — DX: Chronic obstructive pulmonary disease, unspecified: J44.9

## 2011-11-26 HISTORY — DX: Cardiac murmur, unspecified: R01.1

## 2011-11-26 HISTORY — DX: Thoracic aortic aneurysm, without rupture: I71.2

## 2011-11-26 HISTORY — DX: Aneurysm of the ascending aorta, without rupture: I71.21

## 2011-11-26 HISTORY — DX: Malignant (primary) neoplasm, unspecified: C80.1

## 2011-11-26 HISTORY — DX: Other nonspecific abnormal finding of lung field: R91.8

## 2011-11-26 HISTORY — DX: Unspecified osteoarthritis, unspecified site: M19.90

## 2011-11-26 LAB — COMPREHENSIVE METABOLIC PANEL
ALT: 17 U/L (ref 0–35)
Calcium: 10 mg/dL (ref 8.4–10.5)
GFR calc Af Amer: 51 mL/min — ABNORMAL LOW (ref >90)
Glucose, Bld: 143 mg/dL — ABNORMAL HIGH (ref 70–99)
Sodium: 141 mEq/L (ref 135–145)
Total Protein: 7.1 g/dL (ref 6.0–8.3)

## 2011-11-26 LAB — CBC
Hemoglobin: 10.8 g/dL — ABNORMAL LOW (ref 12.0–15.0)
MCH: 29 pg (ref 26.0–34.0)
MCHC: 34.1 g/dL (ref 30.0–36.0)
Platelets: 293 10*3/uL (ref 150–400)
RDW: 14.1 % (ref 11.5–15.5)

## 2011-11-26 NOTE — Progress Notes (Signed)
Patient denied having a stress test, cardiac cath, or sleep study. Patient also informed Nurse that she only uses Albuterol inhaler if she has a cold, but has not used in a very long time.

## 2011-11-26 NOTE — Progress Notes (Signed)
[address]            Pamela Levine [address]          [phone]       HPI:  The patient returns today to discuss the results of her transthoracic echocardiogram as well as her PET scan. The patient is a 66 year old former smoker who was recently seen by Dr. Milinda Antis for routine followup and related a 10 pound weight loss over the past year. Given her smoking history a chest x-ray was performed which showed asymmetric apical pleural thickening and a CT scan of the chest was recommended. CT scan of the chest dated 10/23/2011 showed biapical pleural parenchymal opacification that was greater on the right than the left with some associated calcification bilaterally and the suggestion that there may be a slightly nodular soft tissue component on the right. There was a borderline enlarged right hilar lymph node. The scan also showed a 4.1 cm fusiform ascending aortic aneurysm as well as extensive calcification of the coronary arteries. There is also diffuse calcific plaque noted throughout the thoracic and abdominal aorta.   Current Outpatient Prescriptions  Medication Sig Dispense Refill  . albuterol (PROVENTIL HFA;VENTOLIN HFA) 108 (90 BASE) MCG/ACT inhaler Inhale 2 puffs into the lungs every 6 (six) hours as needed for wheezing.  1 Inhaler  6  . aspirin 81 MG chewable tablet Chew 81 mg by mouth daily.        . Calcium Carbonate-Vitamin D (CALTRATE 600+D) 600-400 MG-UNIT per tablet Take 2 tablets by mouth daily.       . ferrous sulfate 325 (65 FE) MG tablet Take 325 mg by mouth daily.      . Multiple Vitamins-Minerals (CENTRUM SILVER PO) Take 1 tablet by mouth daily.       Marland Kitchen olmesartan-hydrochlorothiazide (BENICAR HCT) 40-25 MG per tablet Take 1 tablet by mouth daily.  30 tablet  11  . simvastatin (ZOCOR) 20 MG tablet Take 1 tablet (20 mg total) by mouth daily.  90 tablet  3  . tiotropium (SPIRIVA) 18 MCG inhalation capsule Place 1 capsule (18 mcg total) into inhaler  and inhale daily.  30 capsule  6  . amLODipine (NORVASC) 10 MG tablet Take 1 tablet (10 mg total) by mouth daily.  90 tablet  3     Physical Exam: BP 173/76  Pulse 80  Resp 18  Ht 5' 7.5" (1.715 m)  Wt 118 lb (53.524 kg)  BMI 18.21 kg/m2  SpO2 98%  LMP 01/29/1976 She looks chronically ill Her lung exam is clear.  Diagnostic Tests:  **               [address]                     [place], [place] [address]                         [phone]   ------------------------------------------------------------ Transthoracic Echocardiography  Patient:    Pamela Levine, Pamela Levine MR #:       [id number] Study Date: 11/18/2011 Gender:     F Age:        89 Height:     170.2cm Weight:     54kg BSA:        1.33m^2 Pt. Status: Room:    ATTENDING  Darlis Loan, Shanie Mauzy  REFERRING    [place], Arthor Gorter  PERFORMING   [place], [place]  SONOGRAPHER  Nestor Ramp cc:  ------------------------------------------------------------ LV EF: 55% -   60%  ------------------------------------------------------------ Indications:      Aneurysm - thoracic 441.2.  ------------------------------------------------------------ History:   PMH:   Chronic obstructive pulmonary disease. Risk factors:  Former tobacco use. Hypertension. Dyslipidemia.  ------------------------------------------------------------ Study Conclusions  - Left ventricle: The cavity size was normal. Systolic   function was normal. The estimated ejection fraction was   in the range of 55% to 60%. Wall motion was normal; there   were no regional wall motion abnormalities. Doppler   parameters are consistent with abnormal left ventricular   relaxation (grade 1 diastolic dysfunction). - Aortic valve: Mild to moderate regurgitation, though not   well visualized. - Aortic root: The aortic root and proximal ascending aorta   was mildly dilated, estimated at 3.2 cm. - Mitral valve:  Mild to moderate regurgitation. - Left atrium: The atrium was mildly dilated. - Pulmonary arteries: Systolic pressure was within the   normal range. Transthoracic echocardiography.  M-mode, complete 2D, spectral Doppler, and color Doppler.  Height:  Height: 170.2cm. Height: 67in.  Weight:  Weight: 54kg. Weight: 118.8lb.  Body mass index:  BMI: 18.6kg/m^2.  Body surface area:    BSA: 1.16m^2.  Blood pressure:     168/72.  Patient status:  Outpatient.  Location:  [facility].  ------------------------------------------------------------  ------------------------------------------------------------ Left ventricle:  The cavity size was normal. Systolic function was normal. The estimated ejection fraction was in the range of 55% to 60%. Wall motion was normal; there were no regional wall motion abnormalities. Doppler parameters are consistent with abnormal left ventricular relaxation (grade 1 diastolic dysfunction).  ------------------------------------------------------------ Aortic valve:   Trileaflet; normal thickness leaflets. Mobility was not restricted.  Doppler:  Transvalvular velocity was within the normal range. There was no stenosis. Mild to moderate regurgitation, though not well visualized.   VTI ratio of LVOT to aortic valve: 0.87. Peak velocity ratio of LVOT to aortic valve: 0.71.    Mean gradient: 8mm Hg (S). Peak gradient: 17mm Hg (S).  ------------------------------------------------------------ Aorta:  Aortic root: The aortic root and proximal ascending aorta was mildly dilated, estimated at 3.2 cm.  ------------------------------------------------------------ Mitral valve:   Structurally normal valve.   Mobility was not restricted.  Doppler:  Transvalvular velocity was within the normal range. There was no evidence for stenosis.  Mild to moderate regurgitation.    Mean gradient: 5mm Hg (D). Peak gradient: 10mm Hg  (D).  ------------------------------------------------------------ Left atrium:  The atrium was mildly dilated.  ------------------------------------------------------------ Right ventricle:  The cavity size was normal. Wall thickness was normal. Systolic function was normal.  ------------------------------------------------------------ Pulmonic valve:    Structurally normal valve.   Cusp separation was normal.  Doppler:  Transvalvular velocity was within the normal range. There was no evidence for stenosis.  Mild regurgitation.  ------------------------------------------------------------ Tricuspid valve:   Structurally normal valve.    Doppler: Transvalvular velocity was within the normal range.  Mild regurgitation.  ------------------------------------------------------------ Pulmonary artery:   The main pulmonary artery was normal-sized. Systolic pressure was within the normal range.   ------------------------------------------------------------ Right atrium:  The atrium was normal in size.  ------------------------------------------------------------ Pericardium:  There was no pericardial effusion.  ------------------------------------------------------------ Systemic veins: Inferior vena cava: The vessel was normal in size; the respirophasic diameter changes were in the normal range (= 50%).  ------------------------------------------------------------  2D measurements  Normal  Doppler measurements   Normal Left ventricle                 LVOT LVID ED,   45.5 mm     43-52   Peak vel, S   148 cm/s ------ chord,                         VTI, S       33.6 cm   ------ PLAX                           Peak            9 mm   ------ LVID ES,   33.4 mm     23-38   gradient, S       Hg chord,                         Aortic valve PLAX                           Peak vel, S   208 cm/s ------ FS, chord,   27 %      >29     Mean vel, S   125 cm/s ------ PLAX                            VTI, S       38.8 cm   ------ LVPW, ED   10.8 mm     ------  Mean            8 mm   ------ IVS/LVPW   0.92        <1.3    gradient, S       Hg ratio, ED                      Peak           17 mm   ------ Vol ED,     128 ml     ------  gradient, S       Hg MOD1                           VTI ratio    0.87      ------ Vol ES,      67 ml     ------  LVOT/AV MOD1                           Peak vel     0.71      ------ EF, MOD1     48 %      ------  ratio, Vol index,   79 ml/m^2 ------  LVOT/AV ED, MOD1                       Regurg PHT    293 ms   ------ Vol index,   41 ml/m^2 ------  Mitral valve ES, MOD1                       Peak E vel    134 cm/s ------ Vol ED,     125 ml     ------  Peak A vel    151 cm/s ------ MOD2                           Mean vel, D   102 cm/s ------ Vol ES,      62 ml     ------  Deceleration  235 ms   150- MOD2                           time                   230 EF, MOD2     50 %      ------  Mean            5 mm   ------ Stroke       63 ml     ------  gradient, D       Hg vol, MOD2                      Peak           10 mm   ------ Vol index,   77 ml/m^2 ------  gradient, D       Hg ED, MOD2                       Peak E/A      0.9      ------ Vol index,   38 ml/m^2 ------  ratio ES, MOD2                       Annulus VTI  48.1 cm   ------ Stroke     38.9 ml/m^2 ------  Tricuspid valve index,                         Regurg peak   252 cm/s ------ MOD2                           vel Ventricular septum             Peak RV-RA     25 mm   ------ IVS, ED     9.9 mm     ------  gradient, S       Hg Aortic valve                   Max regurg    252 cm/s ------ Leaflet      21 mm     15-26   vel separation                     Pulmonic valve Aorta                          Peak vel, S   119 cm/s ------ Root diam,   29 mm     ------ ED Left atrium AP dim       40 mm     ------ AP dim     2.47 cm/m^2 <2.2 index Vol, S       43 ml     ------ Vol index, 26.5  ml/m^2 ------ S Right ventricle RVID ED,   37.5 mm     19-38 PLAX  M-mode measurements    Normal Aortic valve Leaflet      21 mm     15-26 separation   ------------------------------------------------------------ Prepared and Electronically Authenticated by  Dossie Arbour 2013-10-21T18:06:26.580 *RADIOLOGY REPORT*   Clinical Data: Initial treatment strategy for lung mass.   NUCLEAR MEDICINE PET SKULL BASE TO THIGH   Fasting Blood Glucose:  112   Technique:  14.6 mCi F-18 FDG was injected intravenously. CT data was obtained and used for attenuation correction and anatomic localization only.  (This was not acquired as a diagnostic CT examination.) Additional exam technical data entered on technologist worksheet.   Comparison:  Chest CT 10/23/2011.   Findings:   Neck: No FDG positive for enlarged neck nodes are identified.   Chest:  The apical right lung mass/Pancoast tumor demonstrates diffuse increased FDG uptake with SUV max of 19 consistent with neoplasm.  No obvious involvement of the ribs is demonstrated on the CT scan but invasion of the chest wall musculature is possible.The slightly enlarged right hilar lymph node also demonstrates FDG uptake with SUV max 11.6.  A small precarinal lymph node is also weekly positive and likely metastatic disease. No other lung lesions are identified.   Abdomen/Pelvis:  No abnormal hypermetabolic activity within the liver, pancreas, adrenal glands, or spleen.  No hypermetabolic lymph nodes in the abdomen or pelvis.   Skeleton:  No focal hypermetabolic activity to suggest skeletal metastasis.   IMPRESSION:   1.  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  No obvious involvement of the apical ribs but involvement of the chest wall is possible. MRI may be helpful for further evaluation if clinically necessary. 2.  FDG positive right hilar and mediastinal lymph nodes. 3.  No neck adenopathy and no  findings for abdominal/pelvic metastatic disease.     Original Report Authenticated By: P. Loralie Champagne, M.D.     Impression:  Her echocardiogram shows mild to moderate aortic insufficiency with a tricuspid aortic valve and normal left ventricular systolic function. She has a 4.1 cm fusiform ascending aortic aneurysm which will require a followup scan in 1 year. I will arrange that. She has hypertension that is not optimally controlled and her systolic blood pressure is consistently in the 160-180 range. She said that her blood pressure medication was recently changed but she has not gotten that prescription filled. She should be on a beta blocker with her ascending aneurysm to minimize the sheer force on her aorta. I will leave that decision up to her primary physician who is managing her blood pressure.  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 There is hypermetabolic right hilar and mediastinal adenopathy. I reviewed the scan with the patient and her family and answered all their questions. The patient said that she is scheduled to undergo a biopsy procedure tomorrow by Dr. Delton Coombes and I assume this is an endobronchial ultrasound and possibly ENB. It is possible that this may all be an inflammatory process and hopefully her biopsy tomorrow will determine that.  Plan:  She will return to see me in one year for a CT angiogram of the chest to followup on her ascending aneurysm.

## 2011-11-27 ENCOUNTER — Ambulatory Visit (HOSPITAL_COMMUNITY): Payer: BC Managed Care – PPO

## 2011-11-27 ENCOUNTER — Encounter: Payer: Self-pay | Admitting: Family Medicine

## 2011-11-27 ENCOUNTER — Encounter (HOSPITAL_COMMUNITY): Payer: Self-pay

## 2011-11-27 ENCOUNTER — Telehealth: Payer: Self-pay | Admitting: *Deleted

## 2011-11-27 ENCOUNTER — Encounter (HOSPITAL_COMMUNITY)
Admission: RE | Disposition: A | Discharge status: Home / Self Care | Payer: Self-pay | Source: Ambulatory Visit | Attending: Emergency Medicine

## 2011-11-27 ENCOUNTER — Encounter (HOSPITAL_COMMUNITY): Payer: Self-pay | Admitting: Surgery

## 2011-11-27 ENCOUNTER — Ambulatory Visit (HOSPITAL_COMMUNITY)
Admission: RE | Admit: 2011-11-27 | Discharge: 2011-11-27 | Disposition: A | Discharge status: Home / Self Care | Payer: BC Managed Care – PPO | Source: Ambulatory Visit | Attending: Emergency Medicine | Admitting: Emergency Medicine

## 2011-11-27 DIAGNOSIS — R918 Other nonspecific abnormal finding of lung field: Secondary | ICD-10-CM

## 2011-11-27 DIAGNOSIS — R222 Localized swelling, mass and lump, trunk: Secondary | ICD-10-CM

## 2011-11-27 DIAGNOSIS — I10 Essential (primary) hypertension: Secondary | ICD-10-CM | POA: Insufficient documentation

## 2011-11-27 DIAGNOSIS — Z01810 Encounter for preprocedural cardiovascular examination: Secondary | ICD-10-CM | POA: Insufficient documentation

## 2011-11-27 DIAGNOSIS — J4489 Other specified chronic obstructive pulmonary disease: Secondary | ICD-10-CM | POA: Insufficient documentation

## 2011-11-27 DIAGNOSIS — J449 Chronic obstructive pulmonary disease, unspecified: Secondary | ICD-10-CM | POA: Insufficient documentation

## 2011-11-27 DIAGNOSIS — Z01812 Encounter for preprocedural laboratory examination: Secondary | ICD-10-CM | POA: Insufficient documentation

## 2011-11-27 HISTORY — PX: VIDEO BRONCHOSCOPY WITH ENDOBRONCHIAL ULTRASOUND: SHX6177

## 2011-11-27 SURGERY — BRONCHOSCOPY, WITH EBUS
Anesthesia: General | Wound class: Clean Contaminated

## 2011-11-27 MED ORDER — ONDANSETRON HCL 4 MG/2ML IJ SOLN
INTRAMUSCULAR | Status: DC | PRN
  Administered 2011-11-27: 4 mg via INTRAVENOUS

## 2011-11-27 MED ORDER — GLYCOPYRROLATE 0.2 MG/ML IJ SOLN
INTRAMUSCULAR | Status: DC | PRN
  Administered 2011-11-27: 0.2 mg via INTRAVENOUS
  Administered 2011-11-27: 0.6 mg via INTRAVENOUS

## 2011-11-27 MED ORDER — NEOSTIGMINE METHYLSULFATE 1 MG/ML IJ SOLN
INTRAMUSCULAR | Status: DC | PRN
  Administered 2011-11-27: 4 mg via INTRAVENOUS

## 2011-11-27 MED ORDER — ROCURONIUM BROMIDE 100 MG/10ML IV SOLN
INTRAVENOUS | Status: DC | PRN
  Administered 2011-11-27: 30 mg via INTRAVENOUS
  Administered 2011-11-27 (×2): 10 mg via INTRAVENOUS

## 2011-11-27 MED ORDER — PHENYLEPHRINE HCL 10 MG/ML IJ SOLN
INTRAMUSCULAR | Status: DC | PRN
  Administered 2011-11-27: 40 ug via INTRAVENOUS
  Administered 2011-11-27: 80 ug via INTRAVENOUS
  Administered 2011-11-27 (×3): 40 ug via INTRAVENOUS

## 2011-11-27 MED ORDER — LIDOCAINE HCL (CARDIAC) 20 MG/ML IV SOLN
INTRAVENOUS | Status: DC | PRN
  Administered 2011-11-27: 100 mg via INTRAVENOUS

## 2011-11-27 MED ORDER — LACTATED RINGERS IV SOLN
INTRAVENOUS | Status: DC | PRN
Start: 2011-11-27 — End: 2011-11-27
  Administered 2011-11-27 (×2): via INTRAVENOUS

## 2011-11-27 MED ORDER — PROPOFOL 10 MG/ML IV BOLUS
INTRAVENOUS | Status: DC | PRN
  Administered 2011-11-27: 30 mg via INTRAVENOUS
  Administered 2011-11-27: 110 mg via INTRAVENOUS

## 2011-11-27 MED ORDER — FENTANYL CITRATE 0.05 MG/ML IJ SOLN
INTRAMUSCULAR | Status: DC | PRN
  Administered 2011-11-27 (×2): 50 ug via INTRAVENOUS
  Administered 2011-11-27: 100 ug via INTRAVENOUS
  Administered 2011-11-27: 50 ug via INTRAVENOUS

## 2011-11-27 MED ORDER — LACTATED RINGERS IV SOLN
INTRAVENOUS | Status: DC
  Administered 2011-11-27: 11:00:00 via INTRAVENOUS

## 2011-11-27 MED ORDER — EPHEDRINE SULFATE 50 MG/ML IJ SOLN
INTRAMUSCULAR | Status: DC | PRN
  Administered 2011-11-27: 5 mg via INTRAVENOUS
  Administered 2011-11-27: 10 mg via INTRAVENOUS
  Administered 2011-11-27 (×2): 5 mg via INTRAVENOUS

## 2011-11-27 MED ORDER — LIDOCAINE HCL 4 % MT SOLN
OROMUCOSAL | Status: DC | PRN
  Administered 2011-11-27: 4 mL via TOPICAL

## 2011-11-27 MED ORDER — 0.9 % SODIUM CHLORIDE (POUR BTL) OPTIME
TOPICAL | Status: DC | PRN
  Administered 2011-11-27: 1000 mL

## 2011-11-27 SURGICAL SUPPLY — 25 items
BRUSH CYTOL CELLEBRITY 1.5X140 (MISCELLANEOUS) ×1 IMPLANT
CANISTER SUCTION 2500CC (MISCELLANEOUS) ×2 IMPLANT
CLOTH BEACON ORANGE TIMEOUT ST (SAFETY) ×2 IMPLANT
CONT SPEC 4OZ CLIKSEAL STRL BL (MISCELLANEOUS) ×2 IMPLANT
COVER TABLE BACK 60X90 (DRAPES) ×2 IMPLANT
FORCEPS BIOP RJ4 1.8 (CUTTING FORCEPS) ×1 IMPLANT
GLOVE BIOGEL M STRL SZ7.5 (GLOVE) ×2 IMPLANT
GLOVE SURG SS PI 6.5 STRL IVOR (GLOVE) ×1 IMPLANT
GLOVE SURG SS PI 7.5 STRL IVOR (GLOVE) ×1 IMPLANT
GUIDEWIRE JAGWIRE PULMNRY .035 (MISCELLANEOUS) ×1 IMPLANT
JAGWIRE PULMONARY .035 (MISCELLANEOUS) ×2
KIT ROOM TURNOVER OR (KITS) ×2 IMPLANT
MARKER SKIN DUAL TIP RULER LAB (MISCELLANEOUS) ×2 IMPLANT
NEEDLE BIOPSY TRANSBRONCH 21G (NEEDLE) ×2 IMPLANT
NEEDLE SYS SONOTIP II EBUSTBNA (NEEDLE) ×2 IMPLANT
NS IRRIG 1000ML POUR BTL (IV SOLUTION) ×2 IMPLANT
OIL SILICONE PENTAX (PARTS (SERVICE/REPAIRS)) ×1 IMPLANT
PAD ARMBOARD 7.5X6 YLW CONV (MISCELLANEOUS) ×4 IMPLANT
SPONGE GAUZE 4X4 12PLY (GAUZE/BANDAGES/DRESSINGS) ×2 IMPLANT
SYR 20CC LL (SYRINGE) ×2 IMPLANT
SYR 20ML ECCENTRIC (SYRINGE) ×4 IMPLANT
SYR 5ML LUER SLIP (SYRINGE) ×2 IMPLANT
TOWEL OR 17X24 6PK STRL BLUE (TOWEL DISPOSABLE) ×2 IMPLANT
TRAP SPECIMEN MUCOUS 40CC (MISCELLANEOUS) ×2 IMPLANT
TUBE CONNECTING 12X1/4 (SUCTIONS) ×2 IMPLANT

## 2011-11-27 NOTE — Op Note (Signed)
Video Bronchoscopy with Endobronchial Ultrasound Procedure Note  Date of Operation: 11/27/2011  Pre-op Diagnosis: RUL mass, R hilar LAD   Post-op Diagnosis: same  Surgeon: Levy Pupa  Assistants: none  Anesthesia: General endotracheal anesthesia  Operation: Flexible video fiberoptic bronchoscopy with endobronchial ultrasound and biopsies.  Estimated Blood Loss: 5cc  Complications: none apparent  Indications and History: Pamela Levine is a 66 y.o. female with a hx tobacco use and unexplained wt loss. A screening CXR and then f/u CT scan have identified a RUL Pancoast tumor. PET scan shows R hilar node with hypermetabolic activity. Decision was made to pursue nodal bx's using EBUS to achieve tissue diagnosis and staging.  The risks, benefits, complications, treatment options and expected outcomes were discussed with the patient.  The possibilities of pneumothorax, pneumonia, reaction to medication, pulmonary aspiration, perforation of a viscus, bleeding, failure to diagnose a condition and creating a complication requiring transfusion or operation were discussed with the patient who freely signed the consent.    Description of Procedure: The patient was examined in the preoperative area and history and data from the preprocedure consultation were reviewed. It was deemed appropriate to proceed.  The patient was taken to [facility] OR10, identified as Pamela Levine and the procedure verified as Flexible Video Fiberoptic Bronchoscopy.  A Time Out was held and the above information confirmed. After being taken to the operating room general anesthesia was initiated and the patient  was orally intubated. The video fiberoptic bronchoscope was introduced via the endotracheal tube and a general inspection was performed which showed splaying of the RUL carina, but no other abnormalities. There were no endobronchial lesions seen. The standard scope was then withdrawn and the endobronchial ultrasound was  used to identify and characterize the peritracheal, hilar and bronchial lymph nodes. Inspection allowed identification of the 10R lymph node of interest in a distal position to the RUL takeoff, but from this angle the node was behind the R main pulmonary artery and was beyond biopsy. Multiple attempts, including over a guidewire, were made to pass the EBUS scope into the RUL to get a better biopsy angle, but the misshapen RUL carina made it impossible. The EBUS was abandoned and the standard FOB was reintroduced to allow several blind Wang needle biopsies in the RUL in the region of the 10R node. Then, under fluoroscopy, transbronchial biopsies and transbronchial brushings were made of the R apical lung mass. The patient tolerated the procedure well without apparent complications. There was no significant blood loss. The bronchoscope was withdrawn. Anesthesia was reversed and the patient was taken to the PACU for recovery. A post-procedural CXR was ordered and is pending at this time.   Samples: 1. Wang needle biopsies from RUL bronchus 2. Transbronchial biopsies from the RUL 3. Transbronchial brushings from the RUL  Plans:  The patient will be discharged from the PACU to home when recovered from anesthesia. We will review the cytology, pathology results with the patient when they become available. Outpatient followup will be with Dr Frederico Hamman or Dr Delton Coombes.   Levy Pupa, MD, PhD 11/27/2011, 1:05 PM [facility] [phone] or if no answer [phone]

## 2011-11-27 NOTE — Preoperative (Signed)
Beta Blockers   Reason not to administer Beta Blockers:Not Applicable, No BB therapy 

## 2011-11-27 NOTE — Telephone Encounter (Signed)
Received a prior auth request from pharmacy for Benicar, I called insurance company on 10/29 to have forms faxed. Received form on 10/30 will place in your inbox.

## 2011-11-27 NOTE — Anesthesia Postprocedure Evaluation (Signed)
  Anesthesia Post-op Note  Patient: Pamela Levine  Procedure(s) Performed: Procedure(s) (LRB) with comments: VIDEO BRONCHOSCOPY WITH ENDOBRONCHIAL ULTRASOUND (N/A)  Patient Location: PACU  Anesthesia Type:General  Level of Consciousness: awake, oriented, sedated and patient cooperative  Airway and Oxygen Therapy: Patient Spontanous Breathing  Post-op Pain: none  Post-op Assessment: Post-op Vital signs reviewed, Patient's Cardiovascular Status Stable, Respiratory Function Stable, Patent Airway, No signs of Nausea or vomiting and Pain level controlled  Post-op Vital Signs: stable  Complications: No apparent anesthesia complications

## 2011-11-27 NOTE — Transfer of Care (Signed)
Immediate Anesthesia Transfer of Care Note  Patient: Pamela Levine  Procedure(s) Performed: Procedure(s) (LRB) with comments: VIDEO BRONCHOSCOPY WITH ENDOBRONCHIAL ULTRASOUND (N/A)  Patient Location: PACU  Anesthesia Type:General  Level of Consciousness: awake, alert  and oriented  Airway & Oxygen Therapy: Patient Spontanous Breathing and Patient connected to face mask oxygen  Post-op Assessment: Report given to PACU RN and Post -op Vital signs reviewed and stable  Post vital signs: Reviewed and stable  Complications: No apparent anesthesia complications

## 2011-11-27 NOTE — Telephone Encounter (Signed)
Form faxed

## 2011-11-27 NOTE — H&P (Signed)
Patient ID: Pamela Levine, female DOB: 1945/10/29, 66 y.o. MRN: [id number]  HPI  Pamela Levine is a 66 yo woman with PMH significant for 25 pack year smoking, quit in 2010; She presents today to clinic for pulmonary evaluation of an abnormal finding on a recent CT chest 10/23/11. She presented to her PCP office for the annual visit September 2013 and was noted a significant weight los. Workup included a CXR, which showed pleural thickening in the R apex; follow up CT chest showed RUL mass vs R apex pleural thickening. It was also noted that she had a thoracic aortic aneurysm 4.1 cm.  She denies dyspnea or cough, denies hemoptysis. However she reports severe colds once a year that sometimes require a visit to the doctor for antibiotic treatment. Last cold in August of this year. The symptoms usually persist for up to several weeks. Has not required hospitalization or ED visits.  She has been on a prn inhaler years back. Not taking one recently.  She denies chest pain or LE edema. Denies paroxysmal nocturnal dyspnea or wheezing.    Past Medical History   Diagnosis  Date   .  Hyperlipidemia    .  Hypertension    .  History of basal cell carcinoma    .  Light cigarette smoker    .  Kidney stone  01/2000     stent. Renal U/S normal in 12/01.    Past Surgical History   Procedure  Date   .  Partial hysterectomy  1978     bleeding   .  Kidney stone surgery  01/2000     stent    No Known Allergies  History    Social History   .  Marital Status:  Married     Spouse Name:  N/A     Number of Children:  N/A   .  Years of Education:  N/A    Occupational History   .   [employer]    Social History Main Topics   .  Smoking status:  Former Smoker -- 1.0 packs/day for 25 years     Types:  Cigarettes     Quit date:  01/29/2007   .  Smokeless tobacco:  None   .  Alcohol Use:  Yes      occasional   .  Drug Use:  None   .  Sexually Active:  None    Other Topics  Concern   .  None     Social History Narrative   .  None    Social history: worked in [employer]; has lived all her life in [place]; has no pets; is not aware of TB exposure.  Hobbies: the patient likes gardening;  Current Outpatient Prescriptions   Medication  Sig  Dispense  Refill   .  amLODipine (NORVASC) 10 MG tablet  Take 1 tablet (10 mg total) by mouth daily.  90 tablet  3   .  aspirin 81 MG chewable tablet  Chew 81 mg by mouth daily.     .  Calcium Carbonate-Vitamin D (CALTRATE 600+D) 600-400 MG-UNIT per tablet  Take 2 tablets by mouth daily.     Marland Kitchen  losartan-hydrochlorothiazide (HYZAAR) 100-12.5 MG per tablet  Take 1 tablet by mouth daily.  90 tablet  3   .  Multiple Vitamins-Minerals (CENTRUM SILVER PO)  Take by mouth daily.     Marland Kitchen  albuterol (PROVENTIL HFA;VENTOLIN HFA) 108 (90 BASE) MCG/ACT inhaler  Inhale 2 puffs into the lungs every 6 (six) hours as needed for wheezing.  1 Inhaler  6   .  simvastatin (ZOCOR) 20 MG tablet  Take 1 tablet (20 mg total) by mouth daily.  90 tablet  3   .  tiotropium (SPIRIVA) 18 MCG inhalation capsule  Place 1 capsule (18 mcg total) into inhaler and inhale daily.  30 capsule  6    Review of Systems  Constitutional: Positive for unexpected weight change. Negative for fever and appetite change.  HENT: Negative for ear pain, congestion, sore throat, sneezing, trouble swallowing and dental problem.  Respiratory: Negative for cough and shortness of breath.  Cardiovascular: Negative for chest pain, palpitations and leg swelling.  Gastrointestinal: Negative for abdominal pain.  Musculoskeletal: Negative for joint swelling.  Skin: Negative for rash.  Neurological: Negative for headaches.  Psychiatric/Behavioral: Negative for dysphoric mood. The patient is not nervous/anxious.   Filed Vitals:   11/27/11 0810  BP: 161/61  Pulse: 65  Temp: 97.9 F (36.6 C)  Resp: 18    Gen: Pleasant, well-nourished, in no distress,  normal affect  ENT: No lesions,  mouth clear,  oropharynx  clear, no postnasal drip  Neck: No JVD, no TMG, no carotid bruits  Lungs: No use of accessory muscles, no dullness to percussion, clear without rales or rhonchi  Cardiovascular: RRR, heart sounds normal, no murmur or gallops, no peripheral edema  Abdomen: soft and NT, no HSM,  BS normal  Musculoskeletal: No deformities, no cyanosis or clubbing  Neuro: alert, non focal  Skin: Warm, no lesions or rashes   CT chest 10/23/11 personally reviewed  IMPRESSION:  1. Asymmetric right apical pleural parenchymal opacification with  a possible area of nodular soft tissue medially. Borderline  enlarged right hilar lymph node. Further evaluation with PET is  recommended. Right hilar lymph nodes measure up to 11 mm short axis.  2. Scattered areas of amorphous peribronchovascular airspace  disease which may represent resolving infection.  3. Ascending aortic aneurysm 4.1 cm. Extensive coronary artery  calcification.  CXR 11/05/11 personally reviewed  COPD.  Apical pleural thickening bilaterally right greater than left.  Differential diagnosis includes scarring versus a right apical  neoplasm.  Spirometry 11/05/2011  FEV1 1.69L 64%  FVC 2.73L 79%  FEV1/FVC 62   Assessment & Plan:   1. RUL mass vs R apical pleural thickening with R hilar lymphadenopathy.  - plan for EBUS with biopsies today - procedure explained to the patient, all questions answered.   Levy Pupa, MD, PhD 11/27/2011, 10:55 AM [facility] [phone] or if no answer [phone]

## 2011-11-27 NOTE — Telephone Encounter (Signed)
Done and in IN box, thanks  

## 2011-11-27 NOTE — Anesthesia Preprocedure Evaluation (Signed)
Anesthesia Evaluation  Patient identified by MRN, date of birth, ID band Patient awake    Reviewed: Allergy & Precautions, H&P , NPO status , Patient's Chart, lab work & pertinent test results  Airway Mallampati: I TM Distance: >3 FB Neck ROM: full    Dental   Pulmonary COPD         Cardiovascular hypertension, + Peripheral Vascular Disease Rhythm:regular Rate:Normal     Neuro/Psych    GI/Hepatic   Endo/Other    Renal/GU      Musculoskeletal   Abdominal   Peds  Hematology   Anesthesia Other Findings   Reproductive/Obstetrics                           Anesthesia Physical Anesthesia Plan  ASA: III  Anesthesia Plan: General   Post-op Pain Management:    Induction: Intravenous  Airway Management Planned: Oral ETT  Additional Equipment:   Intra-op Plan:   Post-operative Plan: Extubation in OR  Informed Consent: I have reviewed the patients History and Physical, chart, labs and discussed the procedure including the risks, benefits and alternatives for the proposed anesthesia with the patient or authorized representative who has indicated his/her understanding and acceptance.     Plan Discussed with: CRNA, Anesthesiologist and Surgeon  Anesthesia Plan Comments:         Anesthesia Quick Evaluation

## 2011-11-28 ENCOUNTER — Encounter (HOSPITAL_COMMUNITY): Payer: Self-pay | Admitting: Emergency Medicine

## 2011-11-29 ENCOUNTER — Telehealth: Payer: Self-pay | Admitting: Emergency Medicine

## 2011-11-29 ENCOUNTER — Ambulatory Visit (INDEPENDENT_AMBULATORY_CARE_PROVIDER_SITE_OTHER): Payer: BC Managed Care – PPO | Admitting: Pulmonary Disease

## 2011-11-29 DIAGNOSIS — R918 Other nonspecific abnormal finding of lung field: Secondary | ICD-10-CM

## 2011-11-29 DIAGNOSIS — R222 Localized swelling, mass and lump, trunk: Secondary | ICD-10-CM

## 2011-11-29 LAB — PULMONARY FUNCTION TEST

## 2011-11-29 NOTE — Telephone Encounter (Signed)
Pt is calling and requesting the results of her biopsy that was done on Wednesday.  RB please advise. thanks

## 2011-11-29 NOTE — Progress Notes (Signed)
PFT done today. 

## 2011-12-02 ENCOUNTER — Encounter: Payer: Self-pay | Admitting: *Deleted

## 2011-12-02 NOTE — Telephone Encounter (Signed)
Discussed results with the patient. The bx is negative, but I believe this is a false negative. Will arrange for a CT guided bx

## 2011-12-02 NOTE — Telephone Encounter (Signed)
Was unable to reach Pt on Friday 11/1, tried again today but left message. Will continue to try her. Review of the notes would indicate that she did not see Dr Frederico Hamman 11/1 as I believed she was going to do.

## 2011-12-03 ENCOUNTER — Ambulatory Visit: Payer: Self-pay | Admitting: Family Medicine

## 2011-12-03 ENCOUNTER — Telehealth: Payer: Self-pay | Admitting: Emergency Medicine

## 2011-12-03 NOTE — Telephone Encounter (Signed)
Will forward to RB as an FYI 

## 2011-12-03 NOTE — Telephone Encounter (Signed)
CT has this in review and pt will called to set this up Pamela Levine

## 2011-12-04 ENCOUNTER — Encounter: Payer: Self-pay | Admitting: Family Medicine

## 2011-12-04 NOTE — Telephone Encounter (Signed)
Thank you very much 

## 2011-12-05 ENCOUNTER — Ambulatory Visit (INDEPENDENT_AMBULATORY_CARE_PROVIDER_SITE_OTHER): Payer: BC Managed Care – PPO | Admitting: Pulmonary Disease

## 2011-12-05 ENCOUNTER — Encounter: Payer: Self-pay | Admitting: Pulmonary Disease

## 2011-12-05 ENCOUNTER — Telehealth: Payer: Self-pay

## 2011-12-05 VITALS — BP 160/66 | HR 88 | Temp 97.9°F | Ht 67.5 in | Wt 118.8 lb

## 2011-12-05 DIAGNOSIS — J949 Pleural condition, unspecified: Secondary | ICD-10-CM

## 2011-12-05 DIAGNOSIS — R918 Other nonspecific abnormal finding of lung field: Secondary | ICD-10-CM

## 2011-12-05 DIAGNOSIS — R091 Pleurisy: Secondary | ICD-10-CM

## 2011-12-05 DIAGNOSIS — R222 Localized swelling, mass and lump, trunk: Secondary | ICD-10-CM

## 2011-12-05 DIAGNOSIS — I712 Thoracic aortic aneurysm, without rupture: Secondary | ICD-10-CM

## 2011-12-05 DIAGNOSIS — J449 Chronic obstructive pulmonary disease, unspecified: Secondary | ICD-10-CM

## 2011-12-05 NOTE — Telephone Encounter (Signed)
Pt left v/m;pt has question about new BP med recently started by Dr Milinda Antis; pt's cardiologist recently suggested pt should take BP med with beta blocker; does new med have beta blocker in it.Please advise.

## 2011-12-05 NOTE — Telephone Encounter (Signed)
No - she does not take a beta blocker

## 2011-12-05 NOTE — Patient Instructions (Addendum)
We will follow up on the biopsy results.   Please stop Spiriva.   We will call you with the next appointment date and time.

## 2011-12-06 ENCOUNTER — Encounter: Payer: Self-pay | Admitting: Pulmonary Disease

## 2011-12-06 ENCOUNTER — Other Ambulatory Visit: Payer: Self-pay | Admitting: Radiology

## 2011-12-06 MED ORDER — METOPROLOL SUCCINATE ER 50 MG PO TB24: 50.0000 mg | ORAL_TABLET | Freq: Every day | ORAL | Status: DC

## 2011-12-06 NOTE — Telephone Encounter (Signed)
Ok - have her tell the pharmacy to d/c the benicar Start toprol xl (metoprolol) once daily  If any wheezing stop it and let me know because this can worsen wheezing or shortness of breath  This medicine will lower bp and also lower heart rate- if any dizziness let me know Follow up as planned- we may need to increase dose if bp does not adequately lower  Px written for call in

## 2011-12-06 NOTE — Progress Notes (Signed)
Subjective:    Patient ID: Pamela Levine, female    DOB: [birth year], 66 y.o.   MRN: [id number]  HPI Pamela Levine is a 66 yo woman with PMH significant for 25 pack year smoking, quit in 2010; She presents today to clinic for pulmonary evaluation of an abnormal finding on a recent CT chest 10/23/11. She presented to her PCP office for the annual visit September 2013 and was noted a significant weight los. Workup included a CXR, which showed pleural thickening in the R apex; follow up CT chest showed RUL mass vs R apex pleural thickening.  It was also noted that she had a thoracic aortic aneurysm 4.1 cm.   Since the previous visit she underwent PET 11/14/2011 scan evaluation which raised concerns of malignancy; EBUS bronchoscopy 11/27/11 which so far was negative for malignancy.   She denies dyspnea or cough, denies hemoptysis. However she reports severe colds once a year that sometimes require a visit to the doctor for antibiotic treatment. Last cold in August of this year. The symptoms usually persist for up to several weeks. Has not required hospitalization or ED visits.   She has been on a prn inhaler years back. At previous visit she was started on Spiriva; however she reports vision changes and dry eyes since starting this medication. She reports no significant change in her respiratory status with Spiriva.   She denies chest pain or LE edema. Denies paroxysmal nocturnal dyspnea or wheezing.      Past Medical History  Diagnosis Date  . Hyperlipidemia   . Hypertension   . History  of basal cell carcinoma   . Light cigarette smoker   . Kidney stone 01/2000    stent. Renal U/S normal in 12/01.  Marland Kitchen Heart murmur   . Arthritis   . Osteoarthritis   . Cancer     skin  . Lung mass October 2013    RUL  . COPD (chronic obstructive pulmonary disease)   . Ascending aortic aneurysm    Past Surgical History  Procedure Date  . Partial hysterectomy 1978    bleeding  . Kidney stone surgery  01/2000    stent  . Colonoscopy   . Video bronchoscopy with endobronchial ultrasound 11/27/2011    Procedure: VIDEO BRONCHOSCOPY WITH ENDOBRONCHIAL ULTRASOUND;  Surgeon: Leslye Peer, MD;  Location: [facility] OR;  Service: Pulmonary;  Laterality: N/A;   No Known Allergies  History   Social History  . Marital Status: Married    Spouse Name: N/A    Number of Children: 3  . Years of Education: N/A   Occupational History  .  [employer]  . [employer]    Social History Main Topics  . Smoking status: Former Smoker -- 25 years    Types: Cigarettes    Quit date: 01/29/2007  . Smokeless tobacco: None  . Alcohol Use: Yes     Comment: occasional  . Drug Use: No  . Sexually Active: None   Other Topics Concern  . None   Social History Narrative  . None   Social history: worked in [employer]; has lived all her life in [place]; has no pets; reports that  Her husband had TB in childhood.  Hobbies: the patient likes gardening;    Current Outpatient Prescriptions  Medication Sig Dispense Refill  . albuterol (PROVENTIL HFA;VENTOLIN HFA) 108 (90 BASE) MCG/ACT inhaler Inhale 2 puffs into the lungs every 6 (six) hours as needed for wheezing.  1 Inhaler  6  . amLODipine (NORVASC) 10 MG tablet Take 1 tablet (10 mg total) by mouth daily.  90 tablet  3  . aspirin 81 MG chewable tablet Chew 81 mg by mouth daily.        . Calcium Carbonate-Vitamin D (CALTRATE 600+D) 600-400 MG-UNIT per tablet Take 2 tablets by mouth daily.       . ferrous sulfate 325 (65 FE) MG tablet Take 325 mg by mouth daily.      . Multiple Vitamins-Minerals (CENTRUM SILVER PO) Take 1 tablet by mouth daily.       Marland Kitchen olmesartan-hydrochlorothiazide (BENICAR HCT) 40-25 MG per tablet Take 1 tablet by mouth daily.  30 tablet  11  . simvastatin (ZOCOR) 20 MG tablet Take 1 tablet (20 mg total) by mouth daily.  90 tablet  3  . tiotropium (SPIRIVA) 18 MCG inhalation capsule Place 1 capsule (18 mcg total) into inhaler and inhale daily.  30  capsule  6     Review of Systems  Constitutional: Positive for unexpected weight change. Negative for fever and appetite change.  HENT: Negative for ear pain, congestion, sore throat, sneezing, trouble swallowing and dental problem.   Respiratory: Negative for cough and shortness of breath.   Cardiovascular: Negative for chest pain, palpitations and leg swelling.  Gastrointestinal: Negative for abdominal pain.  Musculoskeletal: Negative for joint swelling.  Skin: Negative for rash.  Neurological: Negative for headaches.  Psychiatric/Behavioral: Negative for dysphoric mood. The patient is not nervous/anxious.        Objective:   Physical Exam BP 160/66  Pulse 88  Temp 97.9 F (36.6 C) (Oral)  Ht 5' 7.5" (1.715 m)  Wt 118 lb 12.8 oz (53.887 kg)  BMI 18.33 kg/m2  SpO2 98%  LMP 01/29/1976 General: Comfortable, thin in no acute distress HEENT ; pupils round and reactive to light; nasal mucosa without edema and erythema; retropharynx with no erythema, edema or exudate; LAD no cervical, supraclavicular or axillar  LAD  Cardiovascular: s1s2, no murmurs. Regular rate and rhythm.  Respiratory: Decreased breath sounds bilaterally without wheezing rhonchi or crackles No increased work of breathing.  Abdomen: soft NT ND BS+.   Extremities: no pedal edema. Homans negative  Musculoskeletal osteoarthritis changes  Central nervous system: Alert and oriented No focal neurological deficits.  Results for orders placed during the hospital encounter of 11/26/11  APTT      Component Value Range   aPTT 35  24 - 37 seconds  CBC      Component Value Range   WBC 7.5  4.0 - 10.5 K/uL   RBC 3.72 (*) 3.87 - 5.11 MIL/uL   Hemoglobin 10.8 (*) 12.0 - 15.0 g/dL   HCT 16.1 (*) 09.6 - 04.5 %   MCV 85.2  78.0 - 100.0 fL   MCH 29.0  26.0 - 34.0 pg   MCHC 34.1  30.0 - 36.0 g/dL   RDW 40.9  81.1 - 91.4 %   Platelets 293  150 - 400 K/uL  COMPREHENSIVE METABOLIC PANEL      Component Value Range   Sodium  141  135 - 145 mEq/L   Potassium 3.4 (*) 3.5 - 5.1 mEq/L   Chloride 101  96 - 112 mEq/L   CO2 30  19 - 32 mEq/L   Glucose, Bld 143 (*) 70 - 99 mg/dL   BUN 29 (*) 6 - 23 mg/dL   Creatinine, Ser 7.82 (*) 0.50 - 1.10 mg/dL   Calcium 95.6  8.4 - 21.3  mg/dL   Total Protein 7.1  6.0 - 8.3 g/dL   Albumin 3.2 (*) 3.5 - 5.2 g/dL   AST 25  0 - 37 U/L   ALT 17  0 - 35 U/L   Alkaline Phosphatase 100  39 - 117 U/L   Total Bilirubin 0.1 (*) 0.3 - 1.2 mg/dL   GFR calc non Af Amer 44 (*) >90 mL/min   GFR calc Af Amer 51 (*) >90 mL/min  PROTIME-INR      Component Value Range   Prothrombin Time 13.3  11.6 - 15.2 seconds   INR 1.02  0.00 - 1.49   CT chest 10/23/11 personally reviewed  IMPRESSION:  1. Asymmetric right apical pleural parenchymal opacification with  a possible area of nodular soft tissue medially. Borderline  enlarged right hilar lymph node. Further evaluation with PET is  recommended. Right hilar lymph nodes measure up to 11 mm short axis.  2. Scattered areas of amorphous peribronchovascular airspace  disease which may represent resolving infection.  3. Ascending aortic aneurysm 4.1 cm. Extensive coronary artery  calcification.   CXR 11/05/11 personally reviewed  COPD.  Apical pleural thickening bilaterally right greater than left.  Differential diagnosis includes scarring versus a right apical  Neoplasm.  PET 11/14/2011 1. 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 No obvious involvement of the apical ribs but involvement  of the chest wall is possible. MRI may be helpful for further  evaluation if clinically necessary.  2. FDG positive right hilar and mediastinal lymph nodes.  3. No neck adenopathy and no findings for abdominal/pelvic  metastatic disease.   11/18/11 Study Conclusions  - Left ventricle: The cavity size was normal. Systolic function was normal. The estimated ejection fraction was in the range of 55% to 60%. Wall motion was  normal; there were no regional wall motion abnormalities. Doppler parameters are consistent with abnormal left ventricular relaxation (grade 1 diastolic dysfunction). - Aortic valve: Mild to moderate regurgitation, though not well visualized. - Aortic root: The aortic root and proximal ascending aorta was mildly dilated, estimated at 3.2 cm. - Mitral valve: Mild to moderate regurgitation. - Left atrium: The atrium was mildly dilated. - Pulmonary arteries: Systolic pressure was within the normal range.    Spirometry 11/05/2011  FEV1 1.69L 64% FVC 2.73L 79%  FEV1/FVC 62    Assessment & Plan:   1. RUL mass vs R apical pleural thickening with R hilar lymphadenopathy. The patient with long time history of smoking, family history of lung cancer and recent weight los. DDx include cancer vs infection (especially NTM or fungal disease; she has no risk factors for TB) vs other (autoimmune process). EBUS negative, CT guided biopsy to be performed next week. I would like to obtain microbiology for fungus and AFB from the sample.   2. Moderate COPD; asymptomatic; once a year exacerbation with no hospitalization. Not really finding a benefit from Spiriva but having decreased vision and dryness; I advised her to stop Spiriva.    3. Weight los further investigation per primary care physician.    4. General health; received the flu vaccine, received the pneumococcus vaccine; clonoscopy negative last year. Mammogram pending.   5. Aortic Aneurysm observation; f/u with CT surgery.   We will call her with the biopsy results.

## 2011-12-06 NOTE — Telephone Encounter (Signed)
Pt notified, Rx sent in, Benicar d/c

## 2011-12-06 NOTE — Telephone Encounter (Signed)
Pt saw Dr. Laneta Simmers who said she should be on a Beta Blocker for BP, pt said Dr. Laneta Simmers was going to send you a message advising that she should be on a Beta Blocker, pt hasn't picked up new Rx [facility]) because it's $70 and she doesn't want to pay for it if she needs to be on a Beta Blocker med, please advise

## 2011-12-10 ENCOUNTER — Ambulatory Visit (HOSPITAL_COMMUNITY)
Admission: RE | Admit: 2011-12-10 | Discharge: 2011-12-10 | Disposition: A | Discharge status: Home / Self Care | Payer: BC Managed Care – PPO | Source: Ambulatory Visit | Attending: Emergency Medicine | Admitting: Emergency Medicine

## 2011-12-10 ENCOUNTER — Encounter (HOSPITAL_COMMUNITY): Payer: Self-pay

## 2011-12-10 ENCOUNTER — Ambulatory Visit (HOSPITAL_COMMUNITY)
Admission: RE | Admit: 2011-12-10 | Discharge: 2011-12-10 | Disposition: A | Discharge status: Home / Self Care | Payer: BC Managed Care – PPO | Source: Ambulatory Visit | Attending: Radiology | Admitting: Radiology

## 2011-12-10 VITALS — BP 160/68 | HR 66 | Temp 98.5°F | Resp 14 | Ht 67.5 in | Wt 118.0 lb

## 2011-12-10 DIAGNOSIS — R918 Other nonspecific abnormal finding of lung field: Secondary | ICD-10-CM

## 2011-12-10 DIAGNOSIS — R222 Localized swelling, mass and lump, trunk: Secondary | ICD-10-CM | POA: Insufficient documentation

## 2011-12-10 LAB — CBC
MCH: 29.5 pg (ref 26.0–34.0)
MCHC: 35.3 g/dL (ref 30.0–36.0)
MCV: 83.6 fL (ref 78.0–100.0)
Platelets: 286 10*3/uL (ref 150–400)

## 2011-12-10 LAB — PROTIME-INR: Prothrombin Time: 13.5 seconds (ref 11.6–15.2)

## 2011-12-10 MED ORDER — FENTANYL CITRATE 0.05 MG/ML IJ SOLN
INTRAMUSCULAR | Status: AC | PRN
  Administered 2011-12-10: 50 ug via INTRAVENOUS
  Administered 2011-12-10: 25 ug via INTRAVENOUS

## 2011-12-10 MED ORDER — FENTANYL CITRATE 0.05 MG/ML IJ SOLN
INTRAMUSCULAR | Status: AC
  Filled 2011-12-10: qty 4

## 2011-12-10 MED ORDER — SODIUM CHLORIDE 0.9 % IV SOLN
Freq: Once | INTRAVENOUS | Status: AC
  Administered 2011-12-10: 09:00:00 via INTRAVENOUS

## 2011-12-10 MED ORDER — MIDAZOLAM HCL 2 MG/2ML IJ SOLN
INTRAMUSCULAR | Status: AC | PRN
  Administered 2011-12-10 (×2): 1 mg via INTRAVENOUS

## 2011-12-10 MED ORDER — MIDAZOLAM HCL 2 MG/2ML IJ SOLN
INTRAMUSCULAR | Status: AC
  Filled 2011-12-10: qty 4

## 2011-12-10 NOTE — H&P (Signed)
Chief Complaint: "I'm here for lung biopsy" Referring Physician:Byrum HPI: Pamela Levine is an 66 y.o. female who is found to have a lung mass. Underwent bronchoscopy biopsy with benign findings and is now referred for perc biopsy of the lung mass She otherwise feels well. PMHx and meds reviewed.  Past Medical History:  Past Medical History  Diagnosis Date  . Hyperlipidemia   . Hypertension   . History  of basal cell carcinoma   . Light cigarette smoker   . Kidney stone 01/2000    stent. Renal U/S normal in 12/01.  Marland Kitchen Heart murmur   . Arthritis   . Osteoarthritis   . Cancer     skin  . Lung mass October 2013    RUL  . COPD (chronic obstructive pulmonary disease)   . Ascending aortic aneurysm     Past Surgical History:  Past Surgical History  Procedure Date  . Partial hysterectomy 1978    bleeding  . Kidney stone surgery 01/2000    stent  . Colonoscopy   . Video bronchoscopy with endobronchial ultrasound 11/27/2011    Procedure: VIDEO BRONCHOSCOPY WITH ENDOBRONCHIAL ULTRASOUND;  Surgeon: Leslye Peer, MD;  Location: [facility] OR;  Service: Pulmonary;  Laterality: N/A;    Family History:  Family History  Problem Relation Age of Onset  . Lung cancer Mother     lung- non smoker  . Hypertension Father   . Stroke Father     cerebral hemorrhage  . Lung cancer Sister     lung  . Diabetes Son     Social History:  reports that she quit smoking about 4 years ago. Her smoking use included Cigarettes. She quit after 25 years of use. She does not have any smokeless tobacco history on file. She reports that she drinks alcohol. She reports that she does not use illicit drugs.  Allergies: No Known Allergies  Medications: albuterol (PROVENTIL HFA;VENTOLIN HFA) 108 (90 BASE) MCG/ACT inhaler 1 Inhaler 6 11/05/2011 Sig - Route: Inhale 2 puffs into the lungs every 6 (six) hours as needed for wheezing. - Inhalation Class: Print Number of times this order has been changed since signing: 1  Order [address] amLODipine (NORVASC) 10 MG tablet 90 tablet 3 10/18/2011 10/17/2012 Sig - Route: Take 1 tablet (10 mg total) by mouth daily. - Oral Class: Print Number of times this order has been changed since signing: 1 Order [address] aspirin 81 MG chewable tablet Sig - Route: Chew 81 mg by mouth daily. - Oral Class: Historical Med Number of times this order has been changed since signing: 1 Order [address]it Trail Calcium Carbonate-Vitamin D (CALTRATE 600+D) 600-400 MG-UNIT per tablet Sig - Route: Take 2 tablets by mouth daily. - Oral Class: Historical Med Number of times this order has been changed since signing: 1 Order [address] Trail ferrous sulfate 325 (65 FE) MG tablet Sig - Route: Take 325 mg by mouth daily. - Oral Class: Historical Med Number of times this order has been changed since signing: 1 Order [address]it Trail Multiple Vitamins-Minerals (CENTRUM SILVER PO) Sig - Route: Take 1 tablet by mouth daily. - Oral Class: Historical Med Number of times this order has been changed since signing: 2 Order [address]it Trail simvastatin (ZOCOR) 20 MG tablet 90 tablet 3 10/18/2011 Sig - Route: Take 1 tablet (20 mg total) by mouth daily. - Oral   Please HPI for pertinent positives, otherwise complete 10 system ROS negative.  Physical Exam: Blood pressure 174/67, pulse 71, temperature  97 F (36.1 C), temperature source Oral, resp. rate 16, height 5' 7.5" (1.715 m), weight 118 lb (53.524 kg), last menstrual period 01/29/1976, SpO2 99.00%. Body mass index is 18.21 kg/(m^2).   General Appearance:  Alert, cooperative, no distress, appears stated age  Head:  Normocephalic, without obvious abnormality, atraumatic  ENT: Unremarkable  Neck: Supple, symmetrical, trachea midline, no adenopathy, thyroid: not enlarged, symmetric, no tenderness/mass/nodules  Lungs:   Clear to auscultation bilaterally, no w/r/r, respirations unlabored without use of accessory muscles.  Chest Wall:  No tenderness or deformity  Heart:  Regular rate  and rhythm, S1, S2 normal, no murmur, rub or gallop. Carotids 2+ without bruit.  Neurologic: Normal affect, no gross deficits.   Results for orders placed during the hospital encounter of 12/10/11 (from the past 48 hour(s))  CBC     Status: Abnormal   Collection Time   12/10/11  8:11 AM      Component Value Range Comment   WBC 8.0  4.0 - 10.5 K/uL    RBC 3.73 (*) 3.87 - 5.11 MIL/uL    Hemoglobin 11.0 (*) 12.0 - 15.0 g/dL    HCT 09.8 (*) 11.9 - 46.0 %    MCV 83.6  78.0 - 100.0 fL    MCH 29.5  26.0 - 34.0 pg    MCHC 35.3  30.0 - 36.0 g/dL    RDW 14.7  82.9 - 56.2 %    Platelets 286  150 - 400 K/uL    No results found.  Assessment/Plan RUL lung mass For CT guided biopsy today Discussed procedure, risks, complications. Labs ok. Consent signed in chart  Brayton El PA-C 12/10/2011, 8:52 AM

## 2011-12-10 NOTE — Procedures (Signed)
RUL lung mass Bx Core times 4 (one for Cx) No comp

## 2011-12-10 NOTE — Progress Notes (Signed)
PER KEVIN,PA POST CXR OK AND OK TO D/C HOME

## 2011-12-13 ENCOUNTER — Telehealth: Payer: Self-pay | Admitting: Emergency Medicine

## 2011-12-13 DIAGNOSIS — R222 Localized swelling, mass and lump, trunk: Secondary | ICD-10-CM

## 2011-12-13 LAB — TISSUE CULTURE: Gram Stain: NONE SEEN

## 2011-12-13 NOTE — Telephone Encounter (Signed)
Referral to [facility] clinic T[facility] Sos

## 2011-12-13 NOTE — Telephone Encounter (Signed)
Spoke with the patient regarding her CT needle biopsy results. These show squamous cell carcinoma. I explained the diagnosis, answered her questions. We agreed to refer to [facility] to discuss options for treatment. I will make this referral today.

## 2011-12-13 NOTE — Telephone Encounter (Signed)
Thank you, I was planing to call her today.

## 2011-12-16 ENCOUNTER — Telehealth: Payer: Self-pay | Admitting: Emergency Medicine

## 2011-12-16 ENCOUNTER — Telehealth: Payer: Self-pay | Admitting: *Deleted

## 2011-12-16 NOTE — Telephone Encounter (Signed)
Will forward to RB as an FYI 

## 2011-12-16 NOTE — Telephone Encounter (Signed)
Thank you :)

## 2011-12-16 NOTE — Telephone Encounter (Signed)
Left message for pt to call with appt time

## 2011-12-18 ENCOUNTER — Telehealth: Payer: Self-pay | Admitting: *Deleted

## 2011-12-18 NOTE — Telephone Encounter (Signed)
Spoke with pt regarding appt for mtoc 12/19/11 at 4:00.  Pt is to arrive at 3:45.  Pt verbalized understanding of time and place of appt

## 2011-12-19 ENCOUNTER — Encounter: Payer: Self-pay | Admitting: Internal Medicine

## 2011-12-19 ENCOUNTER — Encounter: Payer: Self-pay | Admitting: Radiation Oncology

## 2011-12-19 ENCOUNTER — Ambulatory Visit
Admission: RE | Admit: 2011-12-19 | Discharge: 2011-12-19 | Disposition: A | Discharge status: Home / Self Care | Payer: BC Managed Care – PPO | Source: Ambulatory Visit | Attending: Radiation Oncology | Admitting: Radiation Oncology

## 2011-12-19 ENCOUNTER — Ambulatory Visit (HOSPITAL_BASED_OUTPATIENT_CLINIC_OR_DEPARTMENT_OTHER): Payer: BC Managed Care – PPO | Admitting: Internal Medicine

## 2011-12-19 ENCOUNTER — Encounter: Payer: Self-pay | Admitting: *Deleted

## 2011-12-19 VITALS — BP 206/81 | HR 72 | Temp 98.1°F | Resp 18 | Ht 67.5 in | Wt 118.0 lb

## 2011-12-19 VITALS — BP 201/80 | HR 72 | Temp 98.1°F | Resp 18 | Ht 67.5 in | Wt 118.0 lb

## 2011-12-19 DIAGNOSIS — M899 Disorder of bone, unspecified: Secondary | ICD-10-CM

## 2011-12-19 DIAGNOSIS — C3490 Malignant neoplasm of unspecified part of unspecified bronchus or lung: Secondary | ICD-10-CM

## 2011-12-19 DIAGNOSIS — C3410 Malignant neoplasm of upper lobe, unspecified bronchus or lung: Secondary | ICD-10-CM

## 2011-12-19 DIAGNOSIS — I10 Essential (primary) hypertension: Secondary | ICD-10-CM

## 2011-12-19 HISTORY — DX: Malignant neoplasm of unspecified part of unspecified bronchus or lung: C34.90

## 2011-12-19 NOTE — Progress Notes (Signed)
[facility]  Clinical Social Work  Clinical Social Work met with patient and patient's son in [place] today with Dr. Arbutus Ped. Dr. Arbutus Ped discussed patient's diagnosis and treatment plan. Mrs. Donalson reports no questions at this time and views her spouse and son, Kathlene November, as her biggest support. CSW encouraged patient to contact CSW with any questions or concerns.  Kathrin Penner, MSW, LCSW Clinical Social Worker [facility] [phone]

## 2011-12-19 NOTE — Progress Notes (Signed)
[facility] Telephone:([phone]) [phone]   Fax:([phone]) [phone]  CONSULT NOTE  REASON FOR CONSULTATION:  66 years old white female diagnosed with lung cancer.  HPI Pamela Levine is a 66 y.o. female with past medical history significant for hypertension, dyslipidemia, osteoarthritis, COPD, and ascending aortic aneurysm, history of kidney stone and history of basal cell carcinoma of the skin. The patient mentions that in September 2013 she was seen by her primary care physician for routine physical examination and complains of weight loss. Chest x-ray was performed on 10/18/2011 and showed right upper lobe lung mass. This was followed by CT scan of the chest with contrast on 10/23/2011 and it showed Biapical pleural parenchymal opacification, right greater than left. There is associated calcification bilaterally. That seen on the right may have a slightly nodular soft tissue component medially, best seen on coronal image 53, measuring 1.7 cm. there was also borderline enlarged right hilar lymph node. A PET scan was performed on 11/15/2011 and it showed right apical lung mass/Pancoast tumor with malignant range FDG uptake. There was no obvious involvement of the apical ribs but involvement of the chest wall is possible. There was also FDG positive right hilar and mediastinal lymph nodes. No neck adenopathy and no findings for abdominal/pelvic metastatic disease. The patient was referred to Dr. Delton Coombes and on 10/31/2011 she underwent bronchoscopy with endobronchial ultrasound but this was not conclusive for malignancy. On 12/10/2011 the patient underwent successful CT guided core biopsy of the right upper lobe mass. The final pathology was consistent with squamous cell carcinoma. By immunohistochemistry, the malignant cells are positive for cytokeratin 5/6 and p63. They are negative for cytokeratin 20, cytokeratin 7, Napsin, and TTF-1. Overall, the histologic features are consistent with squamous  cell carcinoma. Dr. Marchelle Gearing kindly referred the patient to me today for evaluation and recommendation regarding treatment of her condition. The patient was seen at the multidisciplinary thoracic oncology clinic [facility] She is feeling fine today with no specific complaints except for the weight loss of around 10 pounds in the last 12 months. She denied having any significant chest pain, shortness breath, cough or hemoptysis. She has no night sweats. She has no nausea or vomiting. Her family history significant for mother and sister with lung cancer. Her father had a stroke and hypertension. The patient is married and has 3 children. She was accompanied today by her son Pamela Levine. She works at [employer]. She had a history of smoking one pack per day for around 40 years but quit 5 years ago. She has no history of alcohol or drug abuse.   @SFHPI @  Past Medical History  Diagnosis Date  . Hyperlipidemia   . Hypertension   . History  of basal cell carcinoma   . Light cigarette smoker   . Kidney stone 01/2000    stent. Renal U/S normal in 12/01.  Marland Kitchen Heart murmur   . Arthritis   . Osteoarthritis   . Cancer     skin  . Lung mass October 2013    RUL  . COPD (chronic obstructive pulmonary disease)   . Ascending aortic aneurysm   . Lung cancer 12/19/2011    Past Surgical History  Procedure Date  . Partial hysterectomy 1978    bleeding  . Kidney stone surgery 01/2000    stent  . Colonoscopy   . Video bronchoscopy with endobronchial ultrasound 11/27/2011    Procedure: VIDEO BRONCHOSCOPY WITH ENDOBRONCHIAL ULTRASOUND;  Surgeon: Leslye Peer, MD;  Location: [facility] OR;  Service: Pulmonary;  Laterality: N/A;    Family History  Problem Relation Age of Onset  . Lung cancer Mother     lung- non smoker  . Hypertension Father   . Stroke Father     cerebral hemorrhage  . Lung cancer Sister     lung  . Diabetes Son     Social History History  Substance Use Topics  . Smoking  status: Former Smoker -- 25 years    Types: Cigarettes    Quit date: 01/29/2007  . Smokeless tobacco: Not on file  . Alcohol Use: Yes     Comment: occasional    No Known Allergies  Current Outpatient Prescriptions  Medication Sig Dispense Refill  . albuterol (PROVENTIL HFA;VENTOLIN HFA) 108 (90 BASE) MCG/ACT inhaler Inhale 2 puffs into the lungs every 6 (six) hours as needed for wheezing.  1 Inhaler  6  . amLODipine (NORVASC) 10 MG tablet Take 1 tablet (10 mg total) by mouth daily.  90 tablet  3  . Artificial Tear Ointment (DRY EYES OP) Place 1 drop into both eyes 3 (three) times daily as needed. For dry eyes      . aspirin 81 MG chewable tablet Chew 81 mg by mouth daily.        . Calcium Carbonate-Vitamin D (CALTRATE 600+D) 600-400 MG-UNIT per tablet Take 2 tablets by mouth daily.       . ferrous sulfate 325 (65 FE) MG tablet Take 325 mg by mouth daily.      . metoprolol succinate (TOPROL-XL) 50 MG 24 hr tablet Take 1 tablet (50 mg total) by mouth daily. Take with or immediately following a meal.  30 tablet  3  . Multiple Vitamins-Minerals (CENTRUM SILVER PO) Take 1 tablet by mouth daily.       . simvastatin (ZOCOR) 20 MG tablet Take 1 tablet (20 mg total) by mouth daily.  90 tablet  3    Review of Systems  A comprehensive review of systems was negative except for: Constitutional: positive for weight loss  Physical Exam  WUJ:WJXBJ, healthy, no distress, well nourished and well developed SKIN: skin color, texture, turgor are normal HEAD: Normocephalic, No masses, lesions, tenderness or abnormalities EYES: normal, PERRLA EARS: External ears normal OROPHARYNX:no exudate and no erythema  NECK: supple, no adenopathy LYMPH:  no palpable lymphadenopathy, no hepatosplenomegaly BREAST:not examined LUNGS: clear to auscultation  HEART: regular rate & rhythm, no murmurs and no gallops ABDOMEN:abdomen soft and non-tender BACK: Back symmetric, no curvature. EXTREMITIES:no joint  deformities, effusion, or inflammation, no edema, no skin discoloration, no clubbing  NEURO: alert & oriented x 3 with fluent speech, no focal motor/sensory deficits  PERFORMANCE STATUS: ECOG 1  LABORATORY DATA: Lab Results  Component Value Date   WBC 8.0 12/10/2011   HGB 11.0* 12/10/2011   HCT 31.2* 12/10/2011   MCV 83.6 12/10/2011   PLT 286 12/10/2011      Chemistry      Component Value Date/Time   NA 141 11/26/2011 1430   K 3.4* 11/26/2011 1430   CL 101 11/26/2011 1430   CO2 30 11/26/2011 1430   BUN 29* 11/26/2011 1430   CREATININE 1.27* 11/26/2011 1430      Component Value Date/Time   CALCIUM 10.0 11/26/2011 1430   ALKPHOS 100 11/26/2011 1430   AST 25 11/26/2011 1430   ALT 17 11/26/2011 1430   BILITOT 0.1* 11/26/2011 1430       RADIOGRAPHIC STUDIES: Dg Chest 1 View  12/10/2011  *RADIOLOGY REPORT*  Clinical Data: Post biopsy  CHEST - 1 VIEW  Comparison: 11/27/2011; 11/05/2011; chest CT - 10/23/2011; PET CT - 11/14/2011; CT guided right upper lobe lung mass biopsy - earlier same day  Findings:  Grossly unchanged enlarged cardiac silhouette and mediastinal contours with atherosclerotic calcifications within the aortic arch.  There is grossly unchanged asymmetric right apical pleuroparenchymal thickening.  No definite pneumothorax post right upper lobe mass biopsy.  Grossly unchanged pleural parenchymal thickening of the left lung apex.  The lungs again appear hyperinflated with mild diffuse thickening of the pulmonary interstitium.  Linear heterogeneous opacities in the right mid lung are grossly unchanged favored to represent atelectasis or scar.  No new focal airspace opacities.  No definite pleural effusion, though note, the right costophrenic angle is excluded from view.  IMPRESSION:  1. No definite pneumothorax post right upper lobe biopsy. 2. Hyperexpanded lungs and bronchitic change without definite acute cardiopulmonary disease.   Original Report Authenticated By: Tacey Ruiz, MD    Ct Biopsy  12/10/2011  *RADIOLOGY REPORT*  Clinical Data/Indication: RIGHT UPPER LOBE MASS  CT-GUIDED BIOPSY of a right upper lobe mass.  Core.  Sedation: Versed 2.0 mg, Fentanyl 100 mcg.  Total Moderate Sedation Time: 19 minutes.  Procedure: The procedure, risks, benefits, and alternatives were explained to the patient. Questions regarding the procedure were encouraged and answered. The patient understands and consents to the procedure.  The right posterior upper thorax was prepped with betadine in a sterile fashion, and a sterile drape was applied covering the operative field. A mask and sterile gloves were used for the procedure.  Under CT guidance, the 17 gauge needle was inserted into the right upper lobe mass.  Three 18 gauge cores were obtained and placed in formalin for surgical pathology.  A fourth core was obtained and placed in saline for tissue culture.  The guide needle was removed. Final imaging was performed.  Patient tolerated the procedure well without complication.  Vital sign monitoring by nursing staff during the procedure will continue as patient is in the special procedures unit for post procedure observation.  Findings: The images document guide needle placement within the right upper lobe mass.  Post biopsy images demonstrate no pneumothorax.  IMPRESSION: Successful CT-guided core biopsy of a right upper lobe mass or surgical pathology and tissue culture.   Original Report Authenticated By: Jolaine Click, M.D.    Dg Chest Port 1 View  11/27/2011  *RADIOLOGY REPORT*  Clinical Data: Post bronchoscopy.  PORTABLE CHEST - 1 VIEW  Comparison: 11/05/2011.  Findings: Apical parenchymal changes greater on the right with findings of Pancoast tumor as seen on recent PET CT.  Curvilinear structure overlies the right lung apex which has an appearance more suggestive of skin fold rather than pneumothorax. As this patient has had a recent bronchoscopy, follow-up imaging to confirm this  impression recommended.  Remainder findings without change.  IMPRESSION: Curvilinear structure overlies the right lung apex which has an appearance more suggestive of skin fold rather than pneumothorax. As this patient has had a recent bronchoscopy, follow-up imaging to confirm this impression recommended.  This is a call report.   Original Report Authenticated By: Fuller Canada, M.D.    Dg C-arm Bronchoscopy  11/27/2011  CLINICAL DATA: video bronchoscopy   C-ARM BRONCHOSCOPY  Fluoroscopy was utilized by the requesting physician.  No radiographic  interpretation.      ASSESSMENT: This is a very pleasant 66 years old white female recently  diagnosed with a stage IIIa (T1aA., and 2, M0) non-small cell lung cancer, squamous cell carcinoma.  PLAN: I have a lengthy discussion with the patient and her son today about her disease stage, prognosis and treatment options. I recommended for the patient a course of concurrent chemoradiation with weekly carboplatin for AUC of 2 and paclitaxel 45 mg/M2. I discussed with the patient adverse effect of this treatment including but not limited to alopecia, myelosuppression, nausea vomiting, peripheral neuropathy, liver or renal dysfunction. The patient would be seen by Dr. Roselind Messier for evaluation of the radiotherapy option. After completion of the course of concurrent chemoradiation the patient would have restaging scan and evaluated for surgical resection if feasible at that time, otherwise she would be treated with consolidation chemotherapy at that time. I expect the patient to start the first cycle of her concurrent chemoradiation on 12/30/2011. She would have a chemotherapy education class before starting the first cycle of her chemotherapy. I will complete the staging workup I ordered an MRI of the brain to rule out any brain metastasis. The patient would come back for followup visit in 3 weeks for evaluation and management any adverse effect of her  chemotherapy. I will call her pharmacy with Compazine 10 mg by mouth every 6 hours as needed for nausea. I gave the patient and her son the time to ask questions and I answered them completely to their satisfaction. The patient was advised to call me immediately if she has any concerning symptoms in the interval.  All questions were answered. The patient knows to call the clinic with any problems, questions or concerns. We can certainly see the patient much sooner if necessary.  Thank you so much for allowing me to participate in the care of Pamela Levine. I will continue to follow up the patient with you and assist in her care.  I spent 30 minutes counseling the patient face to face. The total time spent in the appointment was 60 minutes.   Malak Orantes K. 12/19/2011, 5:15 PM

## 2011-12-19 NOTE — Progress Notes (Signed)
Radiation Oncology         [phone]) [phone] ________________________________  Initial outpatient Consultation  Name: Pamela Levine MRN: [id number]  Date: 12/19/2011  DOB: [birth year]  VH:QIONG Tower, MD  Leslye Peer, MD   REFERRING PHYSICIAN: Leslye Peer, MD  DIAGNOSIS: The encounter diagnosis was Lung cancer. clinical stage IIIa (T2a., N2, M0) non-small cell lung cancer  HISTORY OF PRESENT ILLNESS::Pamela Levine is a 66 y.o. female who is seen out of the courtesy of Dr. Delton Coombes for an opinion concerning radiation therapy as part of management of patient's recently diagnosed non-small cell lung cancer presenting in the right upper lung area.   earlier this year the patient presented to her primary care physician with the unexplained 10 pound weight loss. As part of the workup a chest x-ray was performed. This shows some changes in the right upper chest region a subsequent CT scan showed asymmetric right apical pleural parenchymal opacification.   a biopsy of the right upper lung was performed 11/27/2011 which was nondiagnostic. A PET scan prior to this biopsy did show malignant range FDG uptake in the right apical area with right hilar and ipsilateral mediastinal involvement. 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  Based on the clinical stage and location the patient was not felt to be a candidate for surgical intervention. She is now seen as part of the multidisciplinary thoracic oncology clinic along with Dr. Arbutus Ped.  PREVIOUS RADIATION THERAPY: No  PAST MEDICAL HISTORY:  has a past medical history of Hyperlipidemia; Hypertension; History  of basal cell carcinoma; Light cigarette smoker; Kidney stone (01/2000); Heart murmur; Arthritis; Osteoarthritis; Cancer; Lung mass (October 2013); COPD (chronic obstructive pulmonary disease); Ascending aortic aneurysm; and Lung cancer (12/19/2011).    PAST  SURGICAL HISTORY: Past Surgical History  Procedure Date  . Partial hysterectomy 1978    bleeding  . Kidney stone surgery 01/2000    stent  . Colonoscopy   . Video bronchoscopy with endobronchial ultrasound 11/27/2011    Procedure: VIDEO BRONCHOSCOPY WITH ENDOBRONCHIAL ULTRASOUND;  Surgeon: Leslye Peer, MD;  Location: [facility] OR;  Service: Pulmonary;  Laterality: N/A;    FAMILY HISTORY: family history includes Diabetes in her son; Hypertension in her father; Lung cancer in her mother and sister; and Stroke in her father.  SOCIAL HISTORY:  reports that she quit smoking about 4 years ago. Her smoking use included Cigarettes. She quit after 25 years of use. She does not have any smokeless tobacco history on file. She reports that she drinks alcohol. She reports that she does not use illicit drugs.  ALLERGIES: Review of patient's allergies indicates no known allergies.  MEDICATIONS:  Current Outpatient Prescriptions  Medication Sig Dispense Refill  . albuterol (PROVENTIL HFA;VENTOLIN HFA) 108 (90 BASE) MCG/ACT inhaler Inhale 2 puffs into the lungs every 6 (six) hours as needed for wheezing.  1 Inhaler  6  . amLODipine (NORVASC) 10 MG tablet Take 1 tablet (10 mg total) by mouth daily.  90 tablet  3  . Artificial Tear Ointment (DRY EYES OP) Place 1 drop into both eyes 3 (three) times daily as needed. For dry eyes      . aspirin 81 MG chewable tablet Chew 81 mg by mouth daily.        . Calcium Carbonate-Vitamin D (CALTRATE 600+D) 600-400 MG-UNIT per tablet Take 2 tablets by mouth daily.       Marland Kitchen  ferrous sulfate 325 (65 FE) MG tablet Take 325 mg by mouth daily.      . metoprolol succinate (TOPROL-XL) 50 MG 24 hr tablet Take 1 tablet (50 mg total) by mouth daily. Take with or immediately following a meal.  30 tablet  3  . Multiple Vitamins-Minerals (CENTRUM SILVER PO) Take 1 tablet by mouth daily.       . simvastatin (ZOCOR) 20 MG tablet Take 1 tablet (20 mg total) by mouth daily.  90 tablet  3     REVIEW OF SYSTEMS:  A 15 point review of systems is documented in the electronic medical record. This was obtained by the nursing staff. However, I reviewed this with the patient to discuss relevant findings and make appropriate changes. She denies any headaches dizziness or blurred vision. Her energy level is good. she denies any appetite problems. Patient works full-time at in [employer] area at [employer] in [employer] division.  she denies any new bony pain. She denies any pain within the chest area or right shoulder region. She denies any weakness in her right arm or hand or numbness she denies any cough or hemoptysis. Accompanied by her son on evaluation today   PHYSICAL EXAM:  height is 5' 7.5" (1.715 m) and weight is 118 lb (53.524 kg). Her oral temperature is 98.1 F (36.7 C). Her blood pressure is 201/80 and her pulse is 72. Her respiration is 18 and oxygen saturation is 97%.   BP 201/80  Pulse 72  Temp 98.1 F (36.7 C) (Oral)  Resp 18  Ht 5' 7.5" (1.715 m)  Wt 118 lb (53.524 kg)  BMI 18.21 kg/m2  SpO2 97%  LMP 01/29/1976  General Appearance:    Alert, cooperative, no distress, appears stated age  Head:    Normocephalic, without obvious abnormality, atraumatic  Eyes:    PERRL, conjunctiva/corneas clear, EOM's intact,       Nose:   Nares normal, septum midline, mucosa normal, no drainage    or sinus tenderness  Throat:   Lips, mucosa, and tongue normal  Neck:   Supple, symmetrical, trachea midline, no adenopathy;    thyroid:  no enlargement/tenderness/nodules; no carotid   bruit or JVD  Back:     Symmetric, no curvature, ROM normal, no CVA tenderness  Lungs:     Clear to auscultation bilaterally, respirations unlabored  Chest Wall:    No tenderness or deformity   Heart:    Regular rate and rhythm, , no murmur, rub   or gallop     Abdomen:     Soft, non-tender, bowel sounds active all four quadrants,    no masses, no organomegaly        Extremities:   Extremities  normal, atraumatic, no cyanosis or edema  Pulses:   2+ and symmetric all extremities  Skin:   Skin color, texture, turgor normal, no rashes or lesions  Lymph nodes:   Cervical, supraclavicular, and axillary nodes normal  Neurologic:   normal strength, sensation and reflexes    throughout    LABORATORY DATA:  Lab Results  Component Value Date   WBC 8.0 12/10/2011   HGB 11.0* 12/10/2011   HCT 31.2* 12/10/2011   MCV 83.6 12/10/2011   PLT 286 12/10/2011   Lab Results  Component Value Date   NA 141 11/26/2011   K 3.4* 11/26/2011   CL 101 11/26/2011   CO2 30 11/26/2011   Lab Results  Component Value Date   ALT 17 11/26/2011  AST 25 11/26/2011   ALKPHOS 100 11/26/2011   BILITOT 0.1* 11/26/2011     RADIOGRAPHY: Dg Chest 1 View  Ct Biopsy  12/10/2011  *RADIOLOGY REPORT*  Clinical Data/Indication: RIGHT UPPER LOBE MASS  CT-GUIDED BIOPSY of a right upper lobe mass.  Core.  Sedation: Versed 2.0 mg, Fentanyl 100 mcg.  Total Moderate Sedation Time: 19 minutes.  Procedure: The procedure, risks, benefits, and alternatives were explained to the patient. Questions regarding the procedure were encouraged and answered. The patient understands and consents to the procedure.  The right posterior upper thorax was prepped with betadine in a sterile fashion, and a sterile drape was applied covering the operative field. A mask and sterile gloves were used for the procedure.  Under CT guidance, the 17 gauge needle was inserted into the right upper lobe mass.  Three 18 gauge cores were obtained and placed in formalin for surgical pathology.  A fourth core was obtained and placed in saline for tissue culture.  The guide needle was removed. Final imaging was performed.  Patient tolerated the procedure well without complication.  Vital sign monitoring by nursing staff during the procedure will continue as patient is in the special procedures unit for post procedure observation.  Findings: The images document  guide needle placement within the right upper lobe mass.  Post biopsy images demonstrate no pneumothorax.  IMPRESSION: Successful CT-guided core biopsy of a right upper lobe mass or surgical pathology and tissue culture.   Original Report Authenticated By: Jolaine Click, M.D.    Dg Chest Port 1 View  11/27/2011  *RADIOLOGY REPORT*  Clinical Data: Post bronchoscopy.  PORTABLE CHEST - 1 VIEW  Comparison: 11/05/2011.  Findings: Apical parenchymal changes greater on the right with findings of Pancoast tumor as seen on recent PET CT.  Curvilinear structure overlies the right lung apex which has an appearance more suggestive of skin fold rather than pneumothorax. As this patient has had a recent bronchoscopy, follow-up imaging to confirm this impression recommended.  Remainder findings without change.  IMPRESSION: Curvilinear structure overlies the right lung apex which has an appearance more suggestive of skin fold rather than pneumothorax. As this patient has had a recent bronchoscopy, follow-up imaging to confirm this impression recommended.  This is a call report.   Original Report Authenticated By: Fuller Canada, M.D.      IMPRESSION: Clinical stage III non-small cell lung cancer. The patient would be an excellent candidate for combined modality therapy including chest radiation and radiosensitizing chemotherapy. I discussed the overall treatment course side effects and potential toxicities of radiation therapy in this situation with the patient and her son. The patient appears to understand and wishes to proceed with planned course of treatment.  PLAN: Simulation and planning on November 26 at 8 AM.  the patient will also be set up for MRI of the brain to complete her staging workup. She will tentatively start her radiation therapy the first week in December.  ------------------------------------------------   [name], PhD, MD

## 2011-12-20 ENCOUNTER — Telehealth: Payer: Self-pay | Admitting: *Deleted

## 2011-12-20 ENCOUNTER — Telehealth: Payer: Self-pay | Admitting: Internal Medicine

## 2011-12-20 ENCOUNTER — Encounter: Payer: Self-pay | Admitting: *Deleted

## 2011-12-20 NOTE — Telephone Encounter (Signed)
Per staff phone call and POF I have scheduled appts. JMW  

## 2011-12-20 NOTE — Telephone Encounter (Signed)
Spoke with pt at [facility] yesterday.  Gave and explained educational/resource information.  Nutrition and distress screening completed.  Questions and concerns addressed 

## 2011-12-20 NOTE — Telephone Encounter (Signed)
appts made  And l/m for pt to call to go over appts    aom

## 2011-12-20 NOTE — Telephone Encounter (Signed)
Spoke with pt today regarding appt times and place.  She verbalized understanding

## 2011-12-22 ENCOUNTER — Ambulatory Visit (HOSPITAL_COMMUNITY): Admission: RE | Admit: 2011-12-22 | Payer: BC Managed Care – PPO | Source: Ambulatory Visit

## 2011-12-22 MED ORDER — PROCHLORPERAZINE MALEATE 10 MG PO TABS: 10.0000 mg | ORAL_TABLET | Freq: Four times a day (QID) | ORAL | Status: DC | PRN

## 2011-12-22 NOTE — Patient Instructions (Signed)
Your recently diagnosed with stage IIIa non-small cell lung cancer. We discussed treatment options including course of concurrent chemoradiation. Followup in 3 weeks.

## 2011-12-23 ENCOUNTER — Telehealth: Payer: Self-pay | Admitting: Internal Medicine

## 2011-12-23 NOTE — Telephone Encounter (Signed)
called pt and she will see me 11/26 after chemo class to get a sch    anne

## 2011-12-24 ENCOUNTER — Telehealth: Payer: Self-pay | Admitting: Internal Medicine

## 2011-12-24 ENCOUNTER — Encounter: Payer: Self-pay | Admitting: *Deleted

## 2011-12-24 ENCOUNTER — Other Ambulatory Visit: Payer: Self-pay | Admitting: Radiation Oncology

## 2011-12-24 ENCOUNTER — Other Ambulatory Visit: Payer: BC Managed Care – PPO

## 2011-12-24 ENCOUNTER — Other Ambulatory Visit (HOSPITAL_BASED_OUTPATIENT_CLINIC_OR_DEPARTMENT_OTHER): Payer: BC Managed Care – PPO

## 2011-12-24 ENCOUNTER — Telehealth: Payer: Self-pay | Admitting: Radiation Oncology

## 2011-12-24 ENCOUNTER — Ambulatory Visit
Admission: RE | Admit: 2011-12-24 | Discharge: 2011-12-24 | Disposition: A | Discharge status: Home / Self Care | Payer: BC Managed Care – PPO | Source: Ambulatory Visit | Attending: Radiation Oncology | Admitting: Radiation Oncology

## 2011-12-24 DIAGNOSIS — R05 Cough: Secondary | ICD-10-CM | POA: Insufficient documentation

## 2011-12-24 DIAGNOSIS — C3490 Malignant neoplasm of unspecified part of unspecified bronchus or lung: Secondary | ICD-10-CM

## 2011-12-24 DIAGNOSIS — Z79899 Other long term (current) drug therapy: Secondary | ICD-10-CM | POA: Insufficient documentation

## 2011-12-24 DIAGNOSIS — R059 Cough, unspecified: Secondary | ICD-10-CM | POA: Insufficient documentation

## 2011-12-24 DIAGNOSIS — L259 Unspecified contact dermatitis, unspecified cause: Secondary | ICD-10-CM | POA: Insufficient documentation

## 2011-12-24 DIAGNOSIS — Z7982 Long term (current) use of aspirin: Secondary | ICD-10-CM | POA: Insufficient documentation

## 2011-12-24 DIAGNOSIS — Z510 Encounter for antineoplastic radiation therapy: Secondary | ICD-10-CM | POA: Insufficient documentation

## 2011-12-24 LAB — CBC WITH DIFFERENTIAL/PLATELET
Eosinophils Absolute: 0.3 10*3/uL (ref 0.0–0.5)
HCT: 31.5 % — ABNORMAL LOW (ref 34.8–46.6)
LYMPH%: 21.2 % (ref 14.0–49.7)
MCHC: 33.1 g/dL (ref 31.5–36.0)
MCV: 89.7 fL (ref 79.5–101.0)
MONO#: 0.5 10*3/uL (ref 0.1–0.9)
MONO%: 5.7 % (ref 0.0–14.0)
NEUT%: 67.8 % (ref 38.4–76.8)
Platelets: 255 10*3/uL (ref 145–400)
WBC: 8 10*3/uL (ref 3.9–10.3)

## 2011-12-24 LAB — COMPREHENSIVE METABOLIC PANEL (CC13)
Alkaline Phosphatase: 132 U/L (ref 40–150)
CO2: 27 mEq/L (ref 22–29)
Creatinine: 1.1 mg/dL (ref 0.6–1.1)
Glucose: 103 mg/dl — ABNORMAL HIGH (ref 70–99)
Total Bilirubin: 0.21 mg/dL (ref 0.20–1.20)

## 2011-12-24 NOTE — Telephone Encounter (Signed)
Per diane Dr. Asa Lente nurse pt does not need lab on Monday with chemo

## 2011-12-24 NOTE — Telephone Encounter (Signed)
Met with patient to discuss RO billing. Pt had no financial concerns at this time.  Dx: 162.9 Lung  Attending Rad:  JK  Rad Tx:  Daily

## 2011-12-24 NOTE — Telephone Encounter (Signed)
Pt came in today had labonly on 11.29.13 needed to change it to today since she was already here....done

## 2011-12-25 NOTE — Progress Notes (Signed)
  Radiation Oncology         [phone]) [phone] ________________________________  Name: Pamela Levine MRN: [id number]  Date: 12/24/2011  DOB: 12-07-1945  SIMULATION AND TREATMENT PLANNING NOTE  DIAGNOSIS:  Clinical stage IIIa (T2a., N2, M0) non-small cell lung cancer   NARRATIVE:  The patient was brought to the CT Simulation planning suite.  Identity was confirmed.  All relevant records and images related to the planned course of therapy were reviewed.  The patient freely provided informed written consent to proceed with treatment after reviewing the details related to the planned course of therapy. The consent form was witnessed and verified by the simulation staff.  Then, the patient was set-up in a stable reproducible  supine position for radiation therapy.  CT images were obtained.  Surface markings were placed.  The CT images were loaded into the planning software.  Then the target and avoidance structures were contoured.  Treatment planning then occurred.  The radiation prescription was entered and confirmed.  A total of 1 complex treatment devices were fabricated. I have requested : 3D Simulation  I have requested a DVH of the following structures: gtv,ptv,lungs, brachial plexus, spinal cord.  I have ordered:dose calc.  PLAN:  The patient will receive 63.0 Gy in 35 fractions.    Special treatment procedure note  The patient will be receiving radiosensitizing chemotherapy. Given the increased potential for toxicities as well as the necessity for close monitoring of the patient and blood work, this constitutes a special treatment procedure.  ________________________________    [name], PhD, MD

## 2011-12-27 ENCOUNTER — Other Ambulatory Visit: Payer: BC Managed Care – PPO

## 2011-12-28 ENCOUNTER — Ambulatory Visit (HOSPITAL_COMMUNITY)
Admission: RE | Admit: 2011-12-28 | Discharge: 2011-12-28 | Disposition: A | Discharge status: Home / Self Care | Payer: BC Managed Care – PPO | Source: Ambulatory Visit | Attending: Internal Medicine | Admitting: Internal Medicine

## 2011-12-28 DIAGNOSIS — C3490 Malignant neoplasm of unspecified part of unspecified bronchus or lung: Secondary | ICD-10-CM | POA: Insufficient documentation

## 2011-12-28 MED ORDER — GADOBENATE DIMEGLUMINE 529 MG/ML IV SOLN
10.0000 mL | Freq: Once | INTRAVENOUS | Status: AC | PRN
  Administered 2011-12-28: 10 mL via INTRAVENOUS

## 2011-12-30 ENCOUNTER — Telehealth: Payer: Self-pay

## 2011-12-30 ENCOUNTER — Other Ambulatory Visit: Payer: Self-pay | Admitting: Internal Medicine

## 2011-12-30 ENCOUNTER — Ambulatory Visit (HOSPITAL_BASED_OUTPATIENT_CLINIC_OR_DEPARTMENT_OTHER): Payer: BC Managed Care – PPO

## 2011-12-30 ENCOUNTER — Encounter: Payer: Self-pay | Admitting: *Deleted

## 2011-12-30 VITALS — BP 182/63 | HR 66 | Temp 98.2°F | Resp 18

## 2011-12-30 DIAGNOSIS — C3490 Malignant neoplasm of unspecified part of unspecified bronchus or lung: Secondary | ICD-10-CM

## 2011-12-30 DIAGNOSIS — Z5111 Encounter for antineoplastic chemotherapy: Secondary | ICD-10-CM

## 2011-12-30 DIAGNOSIS — I10 Essential (primary) hypertension: Secondary | ICD-10-CM

## 2011-12-30 DIAGNOSIS — C3410 Malignant neoplasm of upper lobe, unspecified bronchus or lung: Secondary | ICD-10-CM

## 2011-12-30 MED ORDER — DIPHENHYDRAMINE HCL 50 MG/ML IJ SOLN
50.0000 mg | Freq: Once | INTRAMUSCULAR | Status: AC
  Administered 2011-12-30: 50 mg via INTRAVENOUS

## 2011-12-30 MED ORDER — SODIUM CHLORIDE 0.9 % IV SOLN
Freq: Once | INTRAVENOUS | Status: AC
  Administered 2011-12-30: 09:00:00 via INTRAVENOUS

## 2011-12-30 MED ORDER — ONDANSETRON 16 MG/50ML IVPB (CHCC)
16.0000 mg | Freq: Once | INTRAVENOUS | Status: AC
  Administered 2011-12-30: 16 mg via INTRAVENOUS

## 2011-12-30 MED ORDER — PACLITAXEL CHEMO INJECTION 300 MG/50ML
45.0000 mg/m2 | Freq: Once | INTRAVENOUS | Status: AC
  Administered 2011-12-30: 72 mg via INTRAVENOUS
  Filled 2011-12-30: qty 12

## 2011-12-30 MED ORDER — SODIUM CHLORIDE 0.9 % IV SOLN
137.2000 mg | Freq: Once | INTRAVENOUS | Status: AC
  Administered 2011-12-30: 140 mg via INTRAVENOUS
  Filled 2011-12-30: qty 14

## 2011-12-30 MED ORDER — CLONIDINE HCL 0.1 MG PO TABS
0.2000 mg | ORAL_TABLET | Freq: Once | ORAL | Status: AC
  Administered 2011-12-30: 0.2 mg via ORAL

## 2011-12-30 MED ORDER — FAMOTIDINE IN NACL 20-0.9 MG/50ML-% IV SOLN
20.0000 mg | Freq: Once | INTRAVENOUS | Status: AC
  Administered 2011-12-30: 20 mg via INTRAVENOUS

## 2011-12-30 MED ORDER — DEXAMETHASONE SODIUM PHOSPHATE 4 MG/ML IJ SOLN
20.0000 mg | Freq: Once | INTRAMUSCULAR | Status: AC
  Administered 2011-12-30: 20 mg via INTRAVENOUS

## 2011-12-30 NOTE — Telephone Encounter (Signed)
Pt's BP remaining elevated today BP was 203/80 something after Dr Gwenyth Bouillon gave 2 nerve pills (pt does not know name of med) BP was 168/70 something. Dr Gwenyth Bouillon advised pt to call PCP. H/A in top of head and aches across shoulders for 2 days. No chest pain, SOB or dizziness. Pt scheduled appt to see Dr Milinda Antis 12/31/11 at 12:30 am. Pt advised if condition changes or worsens to go to UC. Pt will monitor BP also and if goes high again will call back or go to UC. Pt presently taking metoprolol ER 50 mg taking one daily. Pt is not taking Amlodipine now.

## 2011-12-30 NOTE — Telephone Encounter (Signed)
Pt notified if sxs get worse overnight go to UC or ER

## 2011-12-30 NOTE — Patient Instructions (Signed)
Cancer Center Discharge Instructions for Patients Receiving Chemotherapy  Today you received the following chemotherapy agents Taxol, Carboplatin To help prevent nausea and vomiting after your treatment, we encourage you to take your nausea medication as directed.  If you develop nausea and vomiting that is not controlled by your nausea medication, call the clinic. If it is after clinic hours your family physician or the after hours number for the clinic or go to the Emergency Department.   BELOW ARE SYMPTOMS THAT SHOULD BE REPORTED IMMEDIATELY:  *FEVER GREATER THAN 100.5 F  *CHILLS WITH OR WITHOUT FEVER  NAUSEA AND VOMITING THAT IS NOT CONTROLLED WITH YOUR NAUSEA MEDICATION  *UNUSUAL SHORTNESS OF BREATH  *UNUSUAL BRUISING OR BLEEDING  TENDERNESS IN MOUTH AND THROAT WITH OR WITHOUT PRESENCE OF ULCERS  *URINARY PROBLEMS  *BOWEL PROBLEMS  UNUSUAL RASH Items with * indicate a potential emergency and should be followed up as soon as possible.  One of the nurses will contact you 24 hours after your treatment. Please let the nurse know about any problems that you may have experienced. Feel free to call the clinic you have any questions or concerns. The clinic phone number is [phone].   I have been informed and understand all the instructions given to me. I know to contact the clinic, my physician, or go to the Emergency Department if any problems should occur. I do not have any questions at this time, but understand that I may call the clinic during office hours   should I have any questions or need assistance in obtaining follow up care.    __________________________________________  _____________  __________ Signature of Patient or Authorized Representative            Date                   Time    __________________________________________ Nurse's Signature     Paclitaxel injection What is this medicine? PACLITAXEL (PAK li TAX el) is a chemotherapy  drug. It targets fast dividing cells, like cancer cells, and causes these cells to die. This medicine is used to treat ovarian cancer, breast cancer, and other cancers. This medicine may be used for other purposes; ask your health care provider or pharmacist if you have questions. What should I tell my health care provider before I take this medicine? They need to know if you have any of these conditions: -blood disorders -irregular heartbeat -infection (especially a virus infection such as chickenpox, cold sores, or herpes) -liver disease -previous or ongoing radiation therapy -an unusual or allergic reaction to paclitaxel, alcohol, polyoxyethylated castor oil, other chemotherapy agents, other medicines, foods, dyes, or preservatives -pregnant or trying to get pregnant -breast-feeding How should I use this medicine? This drug is given as an infusion into a vein. It is administered in a hospital or clinic by a specially trained health care professional. Talk to your pediatrician regarding the use of this medicine in children. Special care may be needed. Overdosage: If you think you have taken too much of this medicine contact a poison control center or emergency room at once. NOTE: This medicine is only for you. Do not share this medicine with others. What if I miss a dose? It is important not to miss your dose. Call your doctor or health care professional if you are unable to keep an appointment. What may interact with this medicine? Do not take this medicine with any of the following medications: -disulfiram -metronidazole This medicine may  also interact with the following medications: -cyclosporine -dexamethasone -diazepam -ketoconazole -medicines to increase blood counts like filgrastim, pegfilgrastim, sargramostim -other chemotherapy drugs like cisplatin, doxorubicin, epirubicin, etoposide, teniposide, vincristine -quinidine -testosterone -vaccines -verapamil Talk to your  doctor or health care professional before taking any of these medicines: -acetaminophen -aspirin -ibuprofen -ketoprofen -naproxen This list may not describe all possible interactions. Give your health care provider a list of all the medicines, herbs, non-prescription drugs, or dietary supplements you use. Also tell them if you smoke, drink alcohol, or use illegal drugs. Some items may interact with your medicine. What should I watch for while using this medicine? Your condition will be monitored carefully while you are receiving this medicine. You will need important blood work done while you are taking this medicine. This drug may make you feel generally unwell. This is not uncommon, as chemotherapy can affect healthy cells as well as cancer cells. Report any side effects. Continue your course of treatment even though you feel ill unless your doctor tells you to stop. In some cases, you may be given additional medicines to help with side effects. Follow all directions for their use. Call your doctor or health care professional for advice if you get a fever, chills or sore throat, or other symptoms of a cold or flu. Do not treat yourself. This drug decreases your body's ability to fight infections. Try to avoid being around people who are sick. This medicine may increase your risk to bruise or bleed. Call your doctor or health care professional if you notice any unusual bleeding. Be careful brushing and flossing your teeth or using a toothpick because you may get an infection or bleed more easily. If you have any dental work done, tell your dentist you are receiving this medicine. Avoid taking products that contain aspirin, acetaminophen, ibuprofen, naproxen, or ketoprofen unless instructed by your doctor. These medicines may hide a fever. Do not become pregnant while taking this medicine. Women should inform their doctor if they wish to become pregnant or think they might be pregnant. There is a  potential for serious side effects to an unborn child. Talk to your health care professional or pharmacist for more information. Do not breast-feed an infant while taking this medicine. Men are advised not to father a child while receiving this medicine. What side effects may I notice from receiving this medicine? Side effects that you should report to your doctor or health care professional as soon as possible: -allergic reactions like skin rash, itching or hives, swelling of the face, lips, or tongue -low blood counts - This drug may decrease the number of white blood cells, red blood cells and platelets. You may be at increased risk for infections and bleeding. -signs of infection - fever or chills, cough, sore throat, pain or difficulty passing urine -signs of decreased platelets or bleeding - bruising, pinpoint red spots on the skin, black, tarry stools, nosebleeds -signs of decreased red blood cells - unusually weak or tired, fainting spells, lightheadedness -breathing problems -chest pain -high or low blood pressure -mouth sores -nausea and vomiting -pain, swelling, redness or irritation at the injection site -pain, tingling, numbness in the hands or feet -slow or irregular heartbeat -swelling of the ankle, feet, hands Side effects that usually do not require medical attention (report to your doctor or health care professional if they continue or are bothersome): -bone pain -complete hair loss including hair on your head, underarms, pubic hair, eyebrows, and eyelashes -changes in the color of fingernails -  diarrhea -loosening of the fingernails -loss of appetite -muscle or joint pain -red flush to skin -sweating This list may not describe all possible side effects. Call your doctor for medical advice about side effects. You may report side effects to FDA at 1-800-FDA-1088. Where should I keep my medicine? This drug is given in a hospital or clinic and will not be stored at  home. NOTE: This sheet is a summary. It may not cover all possible information. If you have questions about this medicine, talk to your doctor, pharmacist, or health care provider.  2012, [employer]/Gold Standard. (12/28/2007 11:54:26 AM)     Carboplatin injection What is this medicine? CARBOPLATIN (KAR boe pla tin) is a chemotherapy drug. It targets fast dividing cells, like cancer cells, and causes these cells to die. This medicine is used to treat ovarian cancer and many other cancers. This medicine may be used for other purposes; ask your health care provider or pharmacist if you have questions. What should I tell my health care provider before I take this medicine? They need to know if you have any of these conditions: -blood disorders -hearing problems -kidney disease -recent or ongoing radiation therapy -an unusual or allergic reaction to carboplatin, cisplatin, other chemotherapy, other medicines, foods, dyes, or preservatives -pregnant or trying to get pregnant -breast-feeding How should I use this medicine? This drug is usually given as an infusion into a vein. It is administered in a hospital or clinic by a specially trained health care professional. Talk to your pediatrician regarding the use of this medicine in children. Special care may be needed. Overdosage: If you think you have taken too much of this medicine contact a poison control center or emergency room at once. NOTE: This medicine is only for you. Do not share this medicine with others. What if I miss a dose? It is important not to miss a dose. Call your doctor or health care professional if you are unable to keep an appointment. What may interact with this medicine? -medicines for seizures -medicines to increase blood counts like filgrastim, pegfilgrastim, sargramostim -some antibiotics like amikacin, gentamicin, neomycin, streptomycin, tobramycin -vaccines Talk to your doctor or health care professional before  taking any of these medicines: -acetaminophen -aspirin -ibuprofen -ketoprofen -naproxen This list may not describe all possible interactions. Give your health care provider a list of all the medicines, herbs, non-prescription drugs, or dietary supplements you use. Also tell them if you smoke, drink alcohol, or use illegal drugs. Some items may interact with your medicine. What should I watch for while using this medicine? Your condition will be monitored carefully while you are receiving this medicine. You will need important blood work done while you are taking this medicine. This drug may make you feel generally unwell. This is not uncommon, as chemotherapy can affect healthy cells as well as cancer cells. Report any side effects. Continue your course of treatment even though you feel ill unless your doctor tells you to stop. In some cases, you may be given additional medicines to help with side effects. Follow all directions for their use. Call your doctor or health care professional for advice if you get a fever, chills or sore throat, or other symptoms of a cold or flu. Do not treat yourself. This drug decreases your body's ability to fight infections. Try to avoid being around people who are sick. This medicine may increase your risk to bruise or bleed. Call your doctor or health care professional if you notice any unusual  bleeding. Be careful brushing and flossing your teeth or using a toothpick because you may get an infection or bleed more easily. If you have any dental work done, tell your dentist you are receiving this medicine. Avoid taking products that contain aspirin, acetaminophen, ibuprofen, naproxen, or ketoprofen unless instructed by your doctor. These medicines may hide a fever. Do not become pregnant while taking this medicine. Women should inform their doctor if they wish to become pregnant or think they might be pregnant. There is a potential for serious side effects to an  unborn child. Talk to your health care professional or pharmacist for more information. Do not breast-feed an infant while taking this medicine. What side effects may I notice from receiving this medicine? Side effects that you should report to your doctor or health care professional as soon as possible: -allergic reactions like skin rash, itching or hives, swelling of the face, lips, or tongue -signs of infection - fever or chills, cough, sore throat, pain or difficulty passing urine -signs of decreased platelets or bleeding - bruising, pinpoint red spots on the skin, black, tarry stools, nosebleeds -signs of decreased red blood cells - unusually weak or tired, fainting spells, lightheadedness -breathing problems -changes in hearing -changes in vision -chest pain -high blood pressure -low blood counts - This drug may decrease the number of white blood cells, red blood cells and platelets. You may be at increased risk for infections and bleeding. -nausea and vomiting -pain, swelling, redness or irritation at the injection site -pain, tingling, numbness in the hands or feet -problems with balance, talking, walking -trouble passing urine or change in the amount of urine Side effects that usually do not require medical attention (report to your doctor or health care professional if they continue or are bothersome): -hair loss -loss of appetite -metallic taste in the mouth or changes in taste This list may not describe all possible side effects. Call your doctor for medical advice about side effects. You may report side effects to FDA at 1-800-FDA-1088. Where should I keep my medicine? This drug is given in a hospital or clinic and will not be stored at home. NOTE: This sheet is a summary. It may not cover all possible information. If you have questions about this medicine, talk to your doctor, pharmacist, or health care provider.  2012, [employer]/Gold Standard. (04/21/2007 2:38:05 PM)

## 2011-12-30 NOTE — Progress Notes (Signed)
Spoke with pt at [facility] today.  Questions and concerns addressed 

## 2011-12-30 NOTE — Telephone Encounter (Signed)
Will see her tomorrow - if worse-do go to UC or ER tonight- keep an eye on blood pressure

## 2011-12-31 ENCOUNTER — Ambulatory Visit (INDEPENDENT_AMBULATORY_CARE_PROVIDER_SITE_OTHER): Payer: BC Managed Care – PPO | Admitting: Family Medicine

## 2011-12-31 ENCOUNTER — Ambulatory Visit
Admission: RE | Admit: 2011-12-31 | Discharge: 2011-12-31 | Disposition: A | Discharge status: Home / Self Care | Payer: BC Managed Care – PPO | Source: Ambulatory Visit | Attending: Radiation Oncology | Admitting: Radiation Oncology

## 2011-12-31 ENCOUNTER — Encounter: Payer: Self-pay | Admitting: Family Medicine

## 2011-12-31 ENCOUNTER — Ambulatory Visit
Admission: RE | Admit: 2011-12-31 | Discharge: 2011-12-31 | Disposition: A | Discharge status: Home / Self Care | Payer: Self-pay | Source: Ambulatory Visit | Attending: Radiation Oncology | Admitting: Radiation Oncology

## 2011-12-31 ENCOUNTER — Telehealth: Payer: Self-pay | Admitting: *Deleted

## 2011-12-31 VITALS — BP 194/88 | HR 80 | Temp 97.9°F | Ht 67.5 in | Wt 119.8 lb

## 2011-12-31 DIAGNOSIS — I10 Essential (primary) hypertension: Secondary | ICD-10-CM

## 2011-12-31 DIAGNOSIS — C3490 Malignant neoplasm of unspecified part of unspecified bronchus or lung: Secondary | ICD-10-CM

## 2011-12-31 MED ORDER — METOPROLOL SUCCINATE ER 50 MG PO TB24: 50.0000 mg | ORAL_TABLET | Freq: Every day | ORAL | Status: DC

## 2011-12-31 MED ORDER — OLMESARTAN MEDOXOMIL-HCTZ 40-25 MG PO TABS: 1.0000 | ORAL_TABLET | Freq: Every day | ORAL | Status: DC

## 2011-12-31 NOTE — Telephone Encounter (Signed)
Message left requesting a return call from patient for chemo f/u.  Awaiting return call from patient.

## 2011-12-31 NOTE — Progress Notes (Signed)
  Radiation Oncology         [phone] ________________________________  Name: Pamela Levine MRN: [id number]  Date: 12/31/2011  DOB: 08/28/1945  Simulation Verification Note  Status: outpatient  NARRATIVE: The patient was brought to the treatment unit and placed in the planned treatment position. The clinical setup was verified. Then port films were obtained and uploaded to the radiation oncology medical record software.  The treatment beams were carefully compared against the planned radiation fields. The position location and shape of the radiation fields was reviewed. They targeted volume of tissue appears to be appropriately covered by the radiation beams. Organs at risk appear to be excluded as planned.  Based on my personal review, I approved the simulation verification. The patient's treatment will proceed as planned.  -----------------------------------  [name], PhD, MD

## 2011-12-31 NOTE — Telephone Encounter (Signed)
Message copied by Augusto Garbe on Tue Dec 31, 2011 10:06 AM ------      Message from: Donnelly Angelica      Created: Mon Dec 30, 2011 12:13 PM      Regarding: Chemo follow up       1st time Taxol, Carboplatin. Dr. Arbutus Ped

## 2011-12-31 NOTE — Progress Notes (Signed)
Subjective:    Patient ID: Pamela Levine, female    DOB: October 25, 1945, 66 y.o.   MRN: [id number]  HPI Here with inc bp   Had her first chemo for non small cell lung cancer  Did ok - was given med for nausea to use - was ok   bp was high  Oncology gave her some nerve pills  Will need to take a medical leave for work   Her attitude is good   Is on amlodipine now  Not on metoprolol - ? Know why bp is stable today  No cp or palpitations or headaches or edema  No side effects to medicines  BP Readings from Last 3 Encounters:  12/31/11 194/88  12/30/11 182/63  12/19/11 201/80     Last night - took 2 nerve pills from her oncol- and it came down to 160s systolic  Is out of simvastatin - no problems with that   Patient Active Problem List  Diagnosis  . Malignant neoplasm of skin of parts of face  . HYPOGLYCEMIA, UNSPECIFIED  . HYPERLIPIDEMIA  . UNSPECIFIED ANEMIA  . HYPERTENSION  . ALLERGIC RHINITIS, SEASONAL  . HYPERGLYCEMIA  . TOBACCO USE, QUIT  . CARCINOMA, BASAL CELL, FACE  . Heart murmur  . Routine general medical examination at a health care facility  . Weight loss, non-intentional  . Other screening mammogram  . Abnormal chest x-ray  . Colon cancer screening  . Ascending aortic aneurysm  . Pleural disorder  . COPD (chronic obstructive pulmonary disease)  . Mass of lung  . Lung cancer   Past Medical History  Diagnosis Date  . Hyperlipidemia   . Hypertension   . History  of basal cell carcinoma   . Light cigarette smoker   . Kidney stone 01/2000    stent. Renal U/S normal in 12/01.  Mar[facility] Kitchen Heart murmur   . Arthritis   . Osteoarthritis   . Cancer     skin  . Lung mass October 2013    RUL  . COPD (chronic obstructive pulmonary disease)   . Ascending aortic aneurysm   . Lung cancer 12/19/2011   Past Surgical History  Procedure Date  . Partial hysterectomy 1978    bleeding  . Kidney stone surgery 01/2000    stent  . Colonoscopy   . Video bronchoscopy  with endobronchial ultrasound 11/27/2011    Procedure: VIDEO BRONCHOSCOPY WITH ENDOBRONCHIAL ULTRASOUND;  Surgeon: Leslye Peer, MD;  Location: [facility] OR;  Service: Pulmonary;  Laterality: N/A;   History  Substance Use Topics  . Smoking status: Former Smoker -- 25 years    Types: Cigarettes    Quit date: 01/29/2007  . Smokeless tobacco: Not on file  . Alcohol Use: Yes     Comment: occasional   Family History  Problem Relation Age of Onset  . Lung cancer Mother     lung- non smoker  . Hypertension Father   . Stroke Father     cerebral hemorrhage  . Lung cancer Sister     lung  . Diabetes Son    No Known Allergies Current Outpatient Prescriptions on File Prior to Visit  Medication Sig Dispense Refill  . albuterol (PROVENTIL HFA;VENTOLIN HFA) 108 (90 BASE) MCG/ACT inhaler Inhale 2 puffs into the lungs every 6 (six) hours as needed for wheezing.  1 Inhaler  6  . amLODipine (NORVASC) 10 MG tablet Take 1 tablet (10 mg total) by mouth daily.  90 tablet  3  .  Artificial Tear Ointment (DRY EYES OP) Place 1 drop into both eyes 3 (three) times daily as needed. For dry eyes      . aspirin 81 MG chewable tablet Chew 81 mg by mouth daily.        . Calcium Carbonate-Vitamin D (CALTRATE 600+D) 600-400 MG-UNIT per tablet Take 2 tablets by mouth daily.       . ferrous sulfate 325 (65 FE) MG tablet Take 325 mg by mouth daily.      . metoprolol succinate (TOPROL-XL) 50 MG 24 hr tablet Take 1 tablet (50 mg total) by mouth daily. Take with or immediately following a meal.  30 tablet  3  . Multiple Vitamins-Minerals (CENTRUM SILVER PO) Take 1 tablet by mouth daily.       . simvastatin (ZOCOR) 20 MG tablet Take 1 tablet (20 mg total) by mouth daily.  90 tablet  3  . prochlorperazine (COMPAZINE) 10 MG tablet Take 1 tablet (10 mg total) by mouth every 6 (six) hours as needed.  60 tablet  0       Review of Systems Review of Systems  Constitutional: Negative for fever, appetite change, fatigue and  unexpected weight change.  Eyes: Negative for pain and visual disturbance.  Respiratory: Negative for cough and shortness of breath.   Cardiovascular: Negative for cp or palpitations    Gastrointestinal: Negative for nausea, diarrhea and constipation.  Genitourinary: Negative for urgency and frequency.  Skin: Negative for pallor or rash   Neurological: Negative for weakness, light-headedness, numbness and pos for headache Hematological: Negative for adenopathy. Does not bruise/bleed easily.  Psychiatric/Behavioral: Negative for dysphoric mood. The patient is not nervous/anxious.         Objective:   Physical Exam  Constitutional: She appears well-developed and well-nourished. No distress.  HENT:  Head: Normocephalic and atraumatic.  Mouth/Throat: Oropharynx is clear and moist.       No facial tenderness  Eyes: Conjunctivae normal and EOM are normal. Pupils are equal, round, and reactive to light. Right eye exhibits no discharge. Left eye exhibits no discharge.  Neck: Normal range of motion. Neck supple. No JVD present. Carotid bruit is not present. No thyromegaly present.  Cardiovascular: Normal rate, regular rhythm, normal heart sounds and intact distal pulses.   Pulmonary/Chest: Effort normal and breath sounds normal. No respiratory distress. She has no wheezes. She has no rales.  Abdominal: Soft. Bowel sounds are normal. She exhibits no distension, no abdominal bruit and no mass. There is no tenderness.  Musculoskeletal: She exhibits no edema.  Lymphadenopathy:    She has no cervical adenopathy.  Neurological: She is alert. She has normal reflexes. No cranial nerve deficit. She exhibits normal muscle tone. Coordination normal.  Skin: Skin is warm and dry. No rash noted. No erythema. No pallor.  Psychiatric: She has a normal mood and affect.          Assessment & Plan:

## 2011-12-31 NOTE — Patient Instructions (Addendum)
Continue the amlodipine as ordered Add back the metoprolol once daily  And start benicar hct 40-25 as well, one pill daily  If you feel dizzy or blood pressure is too low- let me know Follow up in 2-3 weeks

## 2012-01-01 ENCOUNTER — Ambulatory Visit
Admission: RE | Admit: 2012-01-01 | Discharge: 2012-01-01 | Disposition: A | Discharge status: Home / Self Care | Payer: BC Managed Care – PPO | Source: Ambulatory Visit | Attending: Radiation Oncology | Admitting: Radiation Oncology

## 2012-01-02 ENCOUNTER — Ambulatory Visit
Admission: RE | Admit: 2012-01-02 | Discharge: 2012-01-02 | Disposition: A | Discharge status: Home / Self Care | Payer: BC Managed Care – PPO | Source: Ambulatory Visit | Attending: Radiation Oncology | Admitting: Radiation Oncology

## 2012-01-03 ENCOUNTER — Ambulatory Visit
Admission: RE | Admit: 2012-01-03 | Discharge: 2012-01-03 | Disposition: A | Discharge status: Home / Self Care | Payer: BC Managed Care – PPO | Source: Ambulatory Visit | Attending: Radiation Oncology | Admitting: Radiation Oncology

## 2012-01-06 ENCOUNTER — Telehealth: Payer: Self-pay | Admitting: *Deleted

## 2012-01-06 ENCOUNTER — Other Ambulatory Visit (HOSPITAL_BASED_OUTPATIENT_CLINIC_OR_DEPARTMENT_OTHER): Payer: BC Managed Care – PPO | Admitting: Lab

## 2012-01-06 ENCOUNTER — Ambulatory Visit (HOSPITAL_BASED_OUTPATIENT_CLINIC_OR_DEPARTMENT_OTHER): Payer: BC Managed Care – PPO | Admitting: Internal Medicine

## 2012-01-06 ENCOUNTER — Ambulatory Visit (HOSPITAL_BASED_OUTPATIENT_CLINIC_OR_DEPARTMENT_OTHER): Payer: BC Managed Care – PPO

## 2012-01-06 ENCOUNTER — Telehealth: Payer: Self-pay | Admitting: Internal Medicine

## 2012-01-06 ENCOUNTER — Ambulatory Visit
Admission: RE | Admit: 2012-01-06 | Discharge: 2012-01-06 | Disposition: A | Discharge status: Home / Self Care | Payer: BC Managed Care – PPO | Source: Ambulatory Visit | Attending: Radiation Oncology | Admitting: Radiation Oncology

## 2012-01-06 VITALS — BP 167/71 | HR 81 | Temp 98.7°F | Resp 20 | Ht 67.5 in | Wt 114.7 lb

## 2012-01-06 DIAGNOSIS — C3410 Malignant neoplasm of upper lobe, unspecified bronchus or lung: Secondary | ICD-10-CM

## 2012-01-06 DIAGNOSIS — C3490 Malignant neoplasm of unspecified part of unspecified bronchus or lung: Secondary | ICD-10-CM

## 2012-01-06 DIAGNOSIS — Z5111 Encounter for antineoplastic chemotherapy: Secondary | ICD-10-CM

## 2012-01-06 LAB — CBC WITH DIFFERENTIAL/PLATELET
Basophils Absolute: 0.1 10*3/uL (ref 0.0–0.1)
Eosinophils Absolute: 0.3 10*3/uL (ref 0.0–0.5)
HCT: 32.4 % — ABNORMAL LOW (ref 34.8–46.6)
HGB: 11 g/dL — ABNORMAL LOW (ref 11.6–15.9)
LYMPH%: 16.2 % (ref 14.0–49.7)
MONO#: 0.5 10*3/uL (ref 0.1–0.9)
NEUT#: 5.3 10*3/uL (ref 1.5–6.5)
NEUT%: 72.5 % (ref 38.4–76.8)
Platelets: 310 10*3/uL (ref 145–400)
WBC: 7.3 10*3/uL (ref 3.9–10.3)
nRBC: 0 % (ref 0–0)

## 2012-01-06 LAB — COMPREHENSIVE METABOLIC PANEL (CC13)
ALT: 32 U/L (ref 0–55)
BUN: 30 mg/dL — ABNORMAL HIGH (ref 7.0–26.0)
CO2: 28 mEq/L (ref 22–29)
Calcium: 9.4 mg/dL (ref 8.4–10.4)
Chloride: 97 mEq/L — ABNORMAL LOW (ref 98–107)
Creatinine: 1.4 mg/dL — ABNORMAL HIGH (ref 0.6–1.1)
Glucose: 83 mg/dl (ref 70–99)

## 2012-01-06 MED ORDER — DIPHENHYDRAMINE HCL 50 MG/ML IJ SOLN
50.0000 mg | Freq: Once | INTRAMUSCULAR | Status: AC
  Administered 2012-01-06: 50 mg via INTRAVENOUS

## 2012-01-06 MED ORDER — DEXAMETHASONE SODIUM PHOSPHATE 4 MG/ML IJ SOLN
20.0000 mg | Freq: Once | INTRAMUSCULAR | Status: AC
  Administered 2012-01-06: 20 mg via INTRAVENOUS

## 2012-01-06 MED ORDER — FAMOTIDINE IN NACL 20-0.9 MG/50ML-% IV SOLN
20.0000 mg | Freq: Once | INTRAVENOUS | Status: AC
  Administered 2012-01-06: 20 mg via INTRAVENOUS

## 2012-01-06 MED ORDER — PACLITAXEL CHEMO INJECTION 300 MG/50ML
45.0000 mg/m2 | Freq: Once | INTRAVENOUS | Status: AC
  Administered 2012-01-06: 72 mg via INTRAVENOUS
  Filled 2012-01-06: qty 12

## 2012-01-06 MED ORDER — SODIUM CHLORIDE 0.9 % IV SOLN: Freq: Once | INTRAVENOUS | Status: DC

## 2012-01-06 MED ORDER — ONDANSETRON 16 MG/50ML IVPB (CHCC)
16.0000 mg | Freq: Once | INTRAVENOUS | Status: AC
  Administered 2012-01-06: 16 mg via INTRAVENOUS

## 2012-01-06 MED ORDER — SODIUM CHLORIDE 0.9 % IV SOLN
140.0000 mg | Freq: Once | INTRAVENOUS | Status: AC
  Administered 2012-01-06: 140 mg via INTRAVENOUS
  Filled 2012-01-06: qty 14

## 2012-01-06 NOTE — Telephone Encounter (Signed)
Form faxed

## 2012-01-06 NOTE — Telephone Encounter (Signed)
We received a form for pt's mail order pharmacy so pt can start getting her Rx mail order, Dr. Milinda Antis wanted me to call pt to see what Rx she needs to have sent to mail order pharm.  I left voicemail on pt's cell # requesting pt to call office, will try to call back later

## 2012-01-06 NOTE — Telephone Encounter (Signed)
Done and in IN box, thanks  

## 2012-01-06 NOTE — Progress Notes (Signed)
[facility] [phone]   [phone]  OFFICE PROGRESS NOTE  [name], MD [address] [place] [address] [place] [address]  DIAGNOSIS: Stage IIIA (T1a, N2, M0) non-small cell lung cancer, squamous cell carcinoma diagnosed in October of 2013.  PRIOR THERAPY: None  CURRENT THERAPY: Concurrent chemoradiation with weekly carboplatin for AUC of 2 and paclitaxel 45 mg/M2, status post 1 cycle.  INTERVAL HISTORY: Pamela Levine 66 y.o. female returns to the clinic today for routine followup visit accompanied by a friend. The patient is feeling fine today with no specific complaints except for mild pain in the left ear. The patient tolerated the first week of her systemic chemotherapy concurrent with radiation fairly well with no significant adverse effects. She denied having any significant fever or chills. She denied having any nausea or vomiting. She has no significant chest pain, shortness breath, cough or hemoptysis. She has no weight loss or night sweats.  MEDICAL HISTORY: Past Medical History  Diagnosis Date  . Hyperlipidemia   . Hypertension   . History  of basal cell carcinoma   . Light cigarette smoker   . Kidney stone 01/2000    stent. Renal U/S normal in 12/01.  Marland Kitchen Heart murmur   . Arthritis   . Osteoarthritis   . Cancer     skin  . Lung mass October 2013    RUL  . COPD (chronic obstructive pulmonary disease)   . Ascending aortic aneurysm   . Lung cancer 12/19/2011    ALLERGIES:   has no known allergies.  MEDICATIONS:  Current Outpatient Prescriptions  Medication Sig Dispense Refill  . albuterol (PROVENTIL HFA;VENTOLIN HFA) 108 (90 BASE) MCG/ACT inhaler Inhale 2 puffs into the lungs every 6 (six) hours as needed for wheezing.  1 Inhaler  6  . amLODipine (NORVASC) 10 MG tablet Take 1 tablet (10 mg total) by mouth daily.  90 tablet  3  . Artificial Tear Ointment (DRY EYES OP) Place 1 drop into both eyes 3  (three) times daily as needed. For dry eyes      . aspirin 81 MG chewable tablet Chew 81 mg by mouth daily.        . Calcium Carbonate-Vitamin D (CALTRATE 600+D) 600-400 MG-UNIT per tablet Take 2 tablets by mouth daily.       . ferrous sulfate 325 (65 FE) MG tablet Take 325 mg by mouth daily.      . metoprolol succinate (TOPROL-XL) 50 MG 24 hr tablet Take 1 tablet (50 mg total) by mouth daily. Take with or immediately following a meal.  30 tablet  11  . Multiple Vitamins-Minerals (CENTRUM SILVER PO) Take 1 tablet by mouth daily.       Marland Kitchen olmesartan-hydrochlorothiazide (BENICAR HCT) 40-25 MG per tablet Take 1 tablet by mouth daily.  30 tablet  11  . simvastatin (ZOCOR) 20 MG tablet Take 1 tablet (20 mg total) by mouth daily.  90 tablet  3  . prochlorperazine (COMPAZINE) 10 MG tablet Take 1 tablet (10 mg total) by mouth every 6 (six) hours as needed.  60 tablet  0    SURGICAL HISTORY:  Past Surgical History  Procedure Date  . Partial hysterectomy 1978    bleeding  . Kidney stone surgery 01/2000    stent  . Colonoscopy   . Video bronchoscopy with endobronchial ultrasound 11/27/2011    Procedure: VIDEO BRONCHOSCOPY WITH ENDOBRONCHIAL ULTRASOUND;  Surgeon: Leslye Peer,  MD;  Location: [facility] OR;  Service: Pulmonary;  Laterality: N/A;    REVIEW OF SYSTEMS:  A comprehensive review of systems was negative except for: Ears, nose, mouth, throat, and face: positive for earaches   PHYSICAL EXAMINATION: General appearance: alert, cooperative and no distress Head: Normocephalic, without obvious abnormality, atraumatic Neck: no adenopathy Lymph nodes: Cervical, supraclavicular, and axillary nodes normal. Resp: clear to auscultation bilaterally Cardio: regular rate and rhythm, S1, S2 normal, no murmur, click, rub or gallop GI: soft, non-tender; bowel sounds normal; no masses,  no organomegaly Extremities: extremities normal, atraumatic, no cyanosis or edema Neurologic: Grossly normal  ECOG PERFORMANCE  STATUS: 1 - Symptomatic but completely ambulatory  Blood pressure 167/71, pulse 81, temperature 98.7 F (37.1 C), temperature source Oral, resp. rate 20, height 5' 7.5" (1.715 m), weight 114 lb 11.2 oz (52.028 kg), last menstrual period 01/29/1976.  LABORATORY DATA: Lab Results  Component Value Date   WBC 7.3 01/06/2012   HGB 11.0* 01/06/2012   HCT 32.4* 01/06/2012   MCV 87.1 01/06/2012   PLT 310 01/06/2012      Chemistry      Component Value Date/Time   NA 143 12/24/2011 1131   NA 141 11/26/2011 1430   K 4.9 12/24/2011 1131   K 3.4* 11/26/2011 1430   CL 107 12/24/2011 1131   CL 101 11/26/2011 1430   CO2 27 12/24/2011 1131   CO2 30 11/26/2011 1430   BUN 22.0 12/24/2011 1131   BUN 29* 11/26/2011 1430   CREATININE 1.1 12/24/2011 1131   CREATININE 1.27* 11/26/2011 1430      Component Value Date/Time   CALCIUM 8.3* 12/24/2011 1131   CALCIUM 10.0 11/26/2011 1430   ALKPHOS 132 12/24/2011 1131   ALKPHOS 100 11/26/2011 1430   AST 54* 12/24/2011 1131   AST 25 11/26/2011 1430   ALT 47 12/24/2011 1131   ALT 17 11/26/2011 1430   BILITOT 0.21 12/24/2011 1131   BILITOT 0.1* 11/26/2011 1430       RADIOGRAPHIC STUDIES: Dg Chest 1 View  12/10/2011  *RADIOLOGY REPORT*  Clinical Data: Post biopsy  CHEST - 1 VIEW  Comparison: 11/27/2011; 11/05/2011; chest CT - 10/23/2011; PET CT - 11/14/2011; CT guided right upper lobe lung mass biopsy - earlier same day  Findings:  Grossly unchanged enlarged cardiac silhouette and mediastinal contours with atherosclerotic calcifications within the aortic arch.  There is grossly unchanged asymmetric right apical pleuroparenchymal thickening.  No definite pneumothorax post right upper lobe mass biopsy.  Grossly unchanged pleural parenchymal thickening of the left lung apex.  The lungs again appear hyperinflated with mild diffuse thickening of the pulmonary interstitium.  Linear heterogeneous opacities in the right mid lung are grossly unchanged favored to  represent atelectasis or scar.  No new focal airspace opacities.  No definite pleural effusion, though note, the right costophrenic angle is excluded from view.  IMPRESSION:  1. No definite pneumothorax post right upper lobe biopsy. 2. Hyperexpanded lungs and bronchitic change without definite acute cardiopulmonary disease.   Original Report Authenticated By: Tacey Ruiz, MD    Mr Brain W Wo Contrast  12/29/2011  *RADIOLOGY REPORT*  Clinical Data: New diagnosis lung cancer.  Staging.  MRI HEAD WITHOUT AND WITH CONTRAST  Technique:  Multiplanar, multiecho pulse sequences of the brain and surrounding structures were obtained according to standard protocol without and with intravenous contrast  Contrast: 10mL MULTIHANCE GADOBENATE DIMEGLUMINE 529 MG/ML IV SOLN  Comparison: None.  Findings: Ventricle size is normal.  Negative for acute  infarct. Minimal chronic ischemic changes are present.  No mass or edema is present.  Negative for intracranial hemorrhage or fluid collection. No shift of the midline structures.  Postcontrast imaging of the brain reveals normal enhancement.  No enhancing mass or abnormal leptomeningeal enhancement is identified.  IMPRESSION: Negative for metastatic disease.  No acute intracranial abnormality.   Original Report Authenticated By: Janeece Riggers, M.D.    Ct Biopsy  12/10/2011  *RADIOLOGY REPORT*  Clinical Data/Indication: RIGHT UPPER LOBE MASS  CT-GUIDED BIOPSY of a right upper lobe mass.  Core.  Sedation: Versed 2.0 mg, Fentanyl 100 mcg.  Total Moderate Sedation Time: 19 minutes.  Procedure: The procedure, risks, benefits, and alternatives were explained to the patient. Questions regarding the procedure were encouraged and answered. The patient understands and consents to the procedure.  The right posterior upper thorax was prepped with betadine in a sterile fashion, and a sterile drape was applied covering the operative field. A mask and sterile gloves were used for the procedure.   Under CT guidance, the 17 gauge needle was inserted into the right upper lobe mass.  Three 18 gauge cores were obtained and placed in formalin for surgical pathology.  A fourth core was obtained and placed in saline for tissue culture.  The guide needle was removed. Final imaging was performed.  Patient tolerated the procedure well without complication.  Vital sign monitoring by nursing staff during the procedure will continue as patient is in the special procedures unit for post procedure observation.  Findings: The images document guide needle placement within the right upper lobe mass.  Post biopsy images demonstrate no pneumothorax.  IMPRESSION: Successful CT-guided core biopsy of a right upper lobe mass or surgical pathology and tissue culture.   Original Report Authenticated By: Jolaine Click, M.D.     ASSESSMENT: This is a very pleasant 66 years old white female with history of stage IIIa non-small cell lung cancer, squamous cell carcinoma currently undergoing concurrent chemoradiation with weekly carboplatin and paclitaxel status post 1 cycle. The patient is doing fine and tolerating her chemotherapy fairly well.  PLAN: I recommended for her to continue on the same therapy with concurrent chemoradiation as scheduled. She will receive cycle #2 today. The patient would come back for followup visit in 2 weeks for evaluation and management any adverse effect of her chemotherapy. She was advised to call me immediately if she has any concerning symptoms in the interval.  All questions were answered. The patient knows to call the clinic with any problems, questions or concerns. We can certainly see the patient much sooner if necessary.  I spent 15 minutes counseling the patient face to face. The total time spent in the appointment was 25 minutes.

## 2012-01-06 NOTE — Patient Instructions (Signed)
Your labwork is good today We'll proceed with cycle #2 of chemotherapy as scheduled. Followup in 2 weeks

## 2012-01-06 NOTE — Telephone Encounter (Signed)
Pt said she only needs refill of simvastatin, form placed back in your Inbox

## 2012-01-06 NOTE — Telephone Encounter (Signed)
gv and printed appt schedule for pt for Dec  °

## 2012-01-07 ENCOUNTER — Ambulatory Visit
Admission: RE | Admit: 2012-01-07 | Discharge: 2012-01-07 | Disposition: A | Discharge status: Home / Self Care | Payer: BC Managed Care – PPO | Source: Ambulatory Visit | Attending: Radiation Oncology | Admitting: Radiation Oncology

## 2012-01-07 ENCOUNTER — Ambulatory Visit (INDEPENDENT_AMBULATORY_CARE_PROVIDER_SITE_OTHER): Payer: BC Managed Care – PPO | Admitting: Family Medicine

## 2012-01-07 ENCOUNTER — Encounter: Payer: Self-pay | Admitting: Family Medicine

## 2012-01-07 VITALS — BP 150/62 | HR 69 | Temp 98.0°F | Ht 67.5 in | Wt 116.0 lb

## 2012-01-07 DIAGNOSIS — C3490 Malignant neoplasm of unspecified part of unspecified bronchus or lung: Secondary | ICD-10-CM

## 2012-01-07 DIAGNOSIS — H6980 Other specified disorders of Eustachian tube, unspecified ear: Secondary | ICD-10-CM

## 2012-01-07 MED ORDER — RADIAPLEXRX EX GEL
Freq: Once | CUTANEOUS | Status: AC
  Administered 2012-01-07: 1 via TOPICAL

## 2012-01-07 MED ORDER — FLUTICASONE PROPIONATE 50 MCG/ACT NA SUSP: 2.0000 | Freq: Every day | NASAL | Status: DC

## 2012-01-07 NOTE — Patient Instructions (Addendum)
For ear fullness (eustacian tube dysfunction ) and nasal congestion-try flonase nasal spray daily  I sent it to your pharmacy If you develop ear pain or fever let me know

## 2012-01-07 NOTE — Progress Notes (Signed)
Subjective:    Patient ID: Pamela Levine, female    DOB: 1945/04/03, 66 y.o.   MRN: [id number]  HPI Ears have been roaring  L one popped yesterday- some relief Pressure but not pain  ? If a little wet drainage yesterday Can hear gurgling  Mild runny nose  No fever Feels ok   Hearing seems fine   bp is starting to come down- thankful for that and feeling a lot better   Patient Active Problem List  Diagnosis  . Malignant neoplasm of skin of parts of face  . HYPOGLYCEMIA, UNSPECIFIED  . HYPERLIPIDEMIA  . UNSPECIFIED ANEMIA  . HYPERTENSION  . ALLERGIC RHINITIS, SEASONAL  . HYPERGLYCEMIA  . TOBACCO USE, QUIT  . CARCINOMA, BASAL CELL, FACE  . Heart murmur  . Routine general medical examination at a health care facility  . Weight loss, non-intentional  . Other screening mammogram  . Abnormal chest x-ray  . Colon cancer screening  . Ascending aortic aneurysm  . Pleural disorder  . COPD (chronic obstructive pulmonary disease)  . Mass of lung  . Lung cancer   Past Medical History  Diagnosis Date  . Hyperlipidemia   . Hypertension   . History  of basal cell carcinoma   . Light cigarette smoker   . Kidney stone 01/2000    stent. Renal U/S normal in 12/01.  Marland Kitchen Heart murmur   . Arthritis   . Osteoarthritis   . Cancer     skin  . Lung mass October 2013    RUL  . COPD (chronic obstructive pulmonary disease)   . Ascending aortic aneurysm   . Lung cancer 12/19/2011   Past Surgical History  Procedure Date  . Partial hysterectomy 1978    bleeding  . Kidney stone surgery 01/2000    stent  . Colonoscopy   . Video bronchoscopy with endobronchial ultrasound 11/27/2011    Procedure: VIDEO BRONCHOSCOPY WITH ENDOBRONCHIAL ULTRASOUND;  Surgeon: Leslye Peer, MD;  Location: [facility] OR;  Service: Pulmonary;  Laterality: N/A;   History  Substance Use Topics  . Smoking status: Former Smoker -- 25 years    Types: Cigarettes    Quit date: 01/29/2007  . Smokeless tobacco: Not on  file  . Alcohol Use: Yes     Comment: occasional   Family History  Problem Relation Age of Onset  . Lung cancer Mother     lung- non smoker  . Hypertension Father   . Stroke Father     cerebral hemorrhage  . Lung cancer Sister     lung  . Diabetes Son    No Known Allergies Current Outpatient Prescriptions on File Prior to Visit  Medication Sig Dispense Refill  . albuterol (PROVENTIL HFA;VENTOLIN HFA) 108 (90 BASE) MCG/ACT inhaler Inhale 2 puffs into the lungs every 6 (six) hours as needed for wheezing.  1 Inhaler  6  . amLODipine (NORVASC) 10 MG tablet Take 1 tablet (10 mg total) by mouth daily.  90 tablet  3  . Artificial Tear Ointment (DRY EYES OP) Place 1 drop into both eyes 3 (three) times daily as needed. For dry eyes      . aspirin 81 MG chewable tablet Chew 81 mg by mouth daily.        . Calcium Carbonate-Vitamin D (CALTRATE 600+D) 600-400 MG-UNIT per tablet Take 2 tablets by mouth daily.       . ferrous sulfate 325 (65 FE) MG tablet Take 325 mg by mouth daily.      Marland Kitchen  metoprolol succinate (TOPROL-XL) 50 MG 24 hr tablet Take 1 tablet (50 mg total) by mouth daily. Take with or immediately following a meal.  30 tablet  11  . Multiple Vitamins-Minerals (CENTRUM SILVER PO) Take 1 tablet by mouth daily.       Marland Kitchen olmesartan-hydrochlorothiazide (BENICAR HCT) 40-25 MG per tablet Take 1 tablet by mouth daily.  30 tablet  11  . simvastatin (ZOCOR) 20 MG tablet Take 1 tablet (20 mg total) by mouth daily.  90 tablet  3  . prochlorperazine (COMPAZINE) 10 MG tablet Take 1 tablet (10 mg total) by mouth every 6 (six) hours as needed.  60 tablet  0   Current Facility-Administered Medications on File Prior to Visit  Medication Dose Route Frequency Provider Last Rate Last Dose  . [DISCONTINUED] 0.9 %  sodium chloride infusion   Intravenous Once [name], MD          Review of Systems Review of Systems  Constitutional: Negative for fever, appetite change, fatigue and unexpected weight  change.  ENT pos for ear fullness and discomfort/neg for sinus pain/ pos for chronic rhinorrhea  Eyes: Negative for pain and visual disturbance.  Respiratory: Negative for cough and shortness of breath.   Cardiovascular: Negative for cp or palpitations    Gastrointestinal: Negative for nausea, diarrhea and constipation.  Genitourinary: Negative for urgency and frequency.  Skin: Negative for pallor or rash   Neurological: Negative for weakness, light-headedness, numbness and headaches.  Hematological: Negative for adenopathy. Does not bruise/bleed easily.  Psychiatric/Behavioral: Negative for dysphoric mood. The patient is not nervous/anxious.         Objective:   Physical Exam  Constitutional: She appears well-developed and well-nourished. No distress.  HENT:  Head: Normocephalic and atraumatic.  Mouth/Throat: Oropharynx is clear and moist.       TMs dull with scarring bilat eff on L  No erythema or bulging No sinus tenderness Nares are boggy  Eyes: Conjunctivae normal and EOM are normal. Pupils are equal, round, and reactive to light. Right eye exhibits no discharge. Left eye exhibits no discharge.  Neck: Normal range of motion. Neck supple.  Cardiovascular: Normal rate and regular rhythm.   Pulmonary/Chest: Effort normal and breath sounds normal. No respiratory distress. She has no wheezes.       Diffusely distant bs   Lymphadenopathy:    She has no cervical adenopathy.  Neurological: She is alert. She has normal reflexes. No cranial nerve deficit.  Skin: Skin is warm and dry. No rash noted. No erythema. No pallor.  Psychiatric: She has a normal mood and affect.          Assessment & Plan:

## 2012-01-07 NOTE — Progress Notes (Signed)
Patient here for routine weekly assessment.Has completed 5 treatments to right lung.Denies pain, cough or shortness of breath.Routine of clinic reviewed with patient and mother-in-law.Given RadiationTherapy and You Booklet and Radiaplex To apply when skin changes appear.Patient also getting chemotherapy once weekly on Monday.

## 2012-01-07 NOTE — Assessment & Plan Note (Signed)
L sided - with scar tissue evident and small effusion in pt with past hx of OM Will tx with flonase and update (also has allergic rhinitis) If ear pain or fever she will call  Also enc breathing steam for nasal congestion

## 2012-01-07 NOTE — Progress Notes (Signed)
Weekly Management Note Current Dose:5.4 Gy  Projected Dose:59.4 Gy   Narrative:  The patient presents for routine under treatment assessment.  CBCT/MVCT images/Port film x-rays were reviewed.  The chart was checked. Doing well. No dysphagia or worsening shortness of breath.   Physical Findings:  Unchanged  Vitals: There were no vitals filed for this visit. Weight:  Wt Readings from Last 3 Encounters:  01/07/12 116 lb (52.617 kg)  01/06/12 114 lb 11.2 oz (52.028 kg)  12/31/11 119 lb 12 oz (54.318 kg)   Lab Results  Component Value Date   WBC 7.3 01/06/2012   HGB 11.0* 01/06/2012   HCT 32.4* 01/06/2012   MCV 87.1 01/06/2012   PLT 310 01/06/2012   Lab Results  Component Value Date   CREATININE 1.4* 01/06/2012   BUN 30.0* 01/06/2012   NA 135* 01/06/2012   K 4.1 01/06/2012   CL 97* 01/06/2012   CO2 28 01/06/2012     Impression:  The patient is tolerating radiation.  Plan:  Continue treatment as planned. RN education performed.

## 2012-01-08 ENCOUNTER — Encounter: Payer: Self-pay | Admitting: Emergency Medicine

## 2012-01-08 ENCOUNTER — Ambulatory Visit
Admission: RE | Admit: 2012-01-08 | Discharge: 2012-01-08 | Disposition: A | Discharge status: Home / Self Care | Payer: BC Managed Care – PPO | Source: Ambulatory Visit | Attending: Radiation Oncology | Admitting: Radiation Oncology

## 2012-01-08 LAB — FUNGUS CULTURE W SMEAR: Fungal Smear: NONE SEEN

## 2012-01-09 ENCOUNTER — Ambulatory Visit
Admission: RE | Admit: 2012-01-09 | Discharge: 2012-01-09 | Disposition: A | Discharge status: Home / Self Care | Payer: BC Managed Care – PPO | Source: Ambulatory Visit | Attending: Radiation Oncology | Admitting: Radiation Oncology

## 2012-01-10 ENCOUNTER — Ambulatory Visit: Payer: BC Managed Care – PPO | Admitting: Family Medicine

## 2012-01-10 ENCOUNTER — Ambulatory Visit
Admission: RE | Admit: 2012-01-10 | Discharge: 2012-01-10 | Disposition: A | Discharge status: Home / Self Care | Payer: BC Managed Care – PPO | Source: Ambulatory Visit | Attending: Radiation Oncology | Admitting: Radiation Oncology

## 2012-01-13 ENCOUNTER — Ambulatory Visit
Admission: RE | Admit: 2012-01-13 | Discharge: 2012-01-13 | Disposition: A | Discharge status: Home / Self Care | Payer: BC Managed Care – PPO | Source: Ambulatory Visit | Attending: Radiation Oncology | Admitting: Radiation Oncology

## 2012-01-13 ENCOUNTER — Other Ambulatory Visit (HOSPITAL_BASED_OUTPATIENT_CLINIC_OR_DEPARTMENT_OTHER): Payer: BC Managed Care – PPO | Admitting: Lab

## 2012-01-13 ENCOUNTER — Ambulatory Visit (HOSPITAL_BASED_OUTPATIENT_CLINIC_OR_DEPARTMENT_OTHER): Payer: BC Managed Care – PPO

## 2012-01-13 VITALS — BP 166/63 | HR 73 | Temp 98.5°F | Resp 17

## 2012-01-13 DIAGNOSIS — C3490 Malignant neoplasm of unspecified part of unspecified bronchus or lung: Secondary | ICD-10-CM

## 2012-01-13 DIAGNOSIS — C3410 Malignant neoplasm of upper lobe, unspecified bronchus or lung: Secondary | ICD-10-CM

## 2012-01-13 DIAGNOSIS — Z5111 Encounter for antineoplastic chemotherapy: Secondary | ICD-10-CM

## 2012-01-13 LAB — CBC WITH DIFFERENTIAL/PLATELET
BASO%: 0.9 % (ref 0.0–2.0)
Basophils Absolute: 0.1 10e3/uL (ref 0.0–0.1)
EOS%: 2.4 % (ref 0.0–7.0)
Eosinophils Absolute: 0.2 10e3/uL (ref 0.0–0.5)
HCT: 31.3 % — ABNORMAL LOW (ref 34.8–46.6)
HGB: 10.8 g/dL — ABNORMAL LOW (ref 11.6–15.9)
LYMPH%: 12.4 % — ABNORMAL LOW (ref 14.0–49.7)
MCH: 29.7 pg (ref 25.1–34.0)
MCHC: 34.5 g/dL (ref 31.5–36.0)
MCV: 86 fL (ref 79.5–101.0)
MONO#: 0.5 10e3/uL (ref 0.1–0.9)
MONO%: 6.6 % (ref 0.0–14.0)
NEUT#: 5.4 10e3/uL (ref 1.5–6.5)
NEUT%: 77.7 % — ABNORMAL HIGH (ref 38.4–76.8)
Platelets: 257 10e3/uL (ref 145–400)
RBC: 3.64 10e6/uL — ABNORMAL LOW (ref 3.70–5.45)
RDW: 14 % (ref 11.2–14.5)
WBC: 7 10e3/uL (ref 3.9–10.3)
lymph#: 0.9 10e3/uL (ref 0.9–3.3)
nRBC: 0 % (ref 0–0)

## 2012-01-13 LAB — COMPREHENSIVE METABOLIC PANEL (CC13)
ALT: 27 U/L (ref 0–55)
Alkaline Phosphatase: 110 U/L (ref 40–150)
CO2: 27 mEq/L (ref 22–29)
Creatinine: 1.3 mg/dL — ABNORMAL HIGH (ref 0.6–1.1)
Sodium: 133 mEq/L — ABNORMAL LOW (ref 136–145)
Total Bilirubin: <0.2 mg/dL (ref 0.20–1.20)
Total Protein: 6.2 g/dL — ABNORMAL LOW (ref 6.4–8.3)

## 2012-01-13 MED ORDER — SODIUM CHLORIDE 0.9 % IV SOLN
120.0000 mg | Freq: Once | INTRAVENOUS | Status: AC
  Administered 2012-01-13: 120 mg via INTRAVENOUS
  Filled 2012-01-13: qty 12

## 2012-01-13 MED ORDER — DEXAMETHASONE SODIUM PHOSPHATE 4 MG/ML IJ SOLN
20.0000 mg | Freq: Once | INTRAMUSCULAR | Status: AC
  Administered 2012-01-13: 20 mg via INTRAVENOUS

## 2012-01-13 MED ORDER — DIPHENHYDRAMINE HCL 50 MG/ML IJ SOLN
50.0000 mg | Freq: Once | INTRAMUSCULAR | Status: AC
  Administered 2012-01-13: 50 mg via INTRAVENOUS

## 2012-01-13 MED ORDER — PACLITAXEL CHEMO INJECTION 300 MG/50ML
45.0000 mg/m2 | Freq: Once | INTRAVENOUS | Status: AC
  Administered 2012-01-13: 72 mg via INTRAVENOUS
  Filled 2012-01-13: qty 12

## 2012-01-13 MED ORDER — SODIUM CHLORIDE 0.9 % IV SOLN
Freq: Once | INTRAVENOUS | Status: AC
  Administered 2012-01-13: 15:00:00 via INTRAVENOUS

## 2012-01-13 MED ORDER — ONDANSETRON 16 MG/50ML IVPB (CHCC)
16.0000 mg | Freq: Once | INTRAVENOUS | Status: AC
  Administered 2012-01-13: 16 mg via INTRAVENOUS

## 2012-01-13 MED ORDER — FAMOTIDINE IN NACL 20-0.9 MG/50ML-% IV SOLN
20.0000 mg | Freq: Once | INTRAVENOUS | Status: AC
  Administered 2012-01-13: 20 mg via INTRAVENOUS

## 2012-01-13 NOTE — Patient Instructions (Signed)
[facility] Discharge Instructions for Patients Receiving Chemotherapy  Today you received the following chemotherapy agents Taxol and Carboplatin.  To help prevent nausea and vomiting after your treatment, we encourage you to take your nausea medication. Begin taking your nausea medication as often as prescribed for by Dr. Mohamed.    If you develop nausea and vomiting that is not controlled by your nausea medication, call the clinic. If it is after clinic hours your family physician or the after hours number for the clinic or go to the Emergency Department.   BELOW ARE SYMPTOMS THAT SHOULD BE REPORTED IMMEDIATELY:  *FEVER GREATER THAN 100.5 F  *CHILLS WITH OR WITHOUT FEVER  NAUSEA AND VOMITING THAT IS NOT CONTROLLED WITH YOUR NAUSEA MEDICATION  *UNUSUAL SHORTNESS OF BREATH  *UNUSUAL BRUISING OR BLEEDING  TENDERNESS IN MOUTH AND THROAT WITH OR WITHOUT PRESENCE OF ULCERS  *URINARY PROBLEMS  *BOWEL PROBLEMS  UNUSUAL RASH Items with * indicate a potential emergency and should be followed up as soon as possible.  One of the nurses will contact you 24 hours after your treatment. Please let the nurse know about any problems that you may have experienced. Feel free to call the clinic you have any questions or concerns. The clinic phone number is [phone].   I have been informed and understand all the instructions given to me. I know to contact the clinic, my physician, or go to the Emergency Department if any problems should occur. I do not have any questions at this time, but understand that I may call the clinic during office hours   should I have any questions or need assistance in obtaining follow up care.    __________________________________________  _____________  __________ Signature of Patient or Authorized Representative            Date                   Time    __________________________________________ Nurse's Signature    

## 2012-01-14 ENCOUNTER — Ambulatory Visit
Admission: RE | Admit: 2012-01-14 | Discharge: 2012-01-14 | Disposition: A | Discharge status: Home / Self Care | Payer: BC Managed Care – PPO | Source: Ambulatory Visit | Attending: Radiation Oncology | Admitting: Radiation Oncology

## 2012-01-14 ENCOUNTER — Ambulatory Visit
Admission: RE | Admit: 2012-01-14 | Payer: BC Managed Care – PPO | Source: Ambulatory Visit | Admitting: Radiation Oncology

## 2012-01-14 ENCOUNTER — Encounter: Payer: Self-pay | Admitting: Family Medicine

## 2012-01-14 ENCOUNTER — Encounter: Payer: Self-pay | Admitting: Radiation Oncology

## 2012-01-14 ENCOUNTER — Ambulatory Visit (INDEPENDENT_AMBULATORY_CARE_PROVIDER_SITE_OTHER): Payer: BC Managed Care – PPO | Admitting: Family Medicine

## 2012-01-14 VITALS — BP 155/70 | HR 88 | Temp 98.2°F | Ht 67.5 in | Wt 115.0 lb

## 2012-01-14 DIAGNOSIS — C3490 Malignant neoplasm of unspecified part of unspecified bronchus or lung: Secondary | ICD-10-CM

## 2012-01-14 DIAGNOSIS — I10 Essential (primary) hypertension: Secondary | ICD-10-CM

## 2012-01-14 MED ORDER — OLMESARTAN MEDOXOMIL-HCTZ 40-25 MG PO TABS: 1.0000 | ORAL_TABLET | Freq: Every day | ORAL | Status: DC

## 2012-01-14 MED ORDER — METOPROLOL SUCCINATE ER 50 MG PO TB24: 50.0000 mg | ORAL_TABLET | Freq: Every day | ORAL | Status: DC

## 2012-01-14 NOTE — Patient Instructions (Addendum)
Blood pressure is much improved  I'm glad it is even better at home and you are doing well with treatments Labs are reassuring  No change in medicines  Follow up with me in about 3 months

## 2012-01-14 NOTE — Assessment & Plan Note (Signed)
Much improved with current med regimen- and better at home than in the office  Disc goals for bp numbers  Will continue to monitor-esp in setting of chemo and radiation for lung cancer  Consider inc metoprolol if necessary  F/u 3 mo  Labs rev as well

## 2012-01-14 NOTE — Progress Notes (Signed)
[facility]    Radiation Oncology [address] [place]     [name], M.D. [place], [place] [address]               [name], M.D., Ph.D. Phone: [phone]      [name], M.D. Fax: [phone]      [name], M.D., Ph.D.         [name], M.D.         [name], M.D Weekly Treatment Management Note  Name: Pamela Levine     MRN: [id number]        CSN: [id number] Date: 01/14/2012      DOB: 25-Jul-1945  CC: [name], MD         [facility]    Status: Outpatient  Diagnosis: Clinical stage IIIa (T2a., N2, M0) non-small cell lung cancer   Current Dose: 18.0 Gy  Current Fraction: 10  Planned Dose: 63.0 Gy  Narrative: [name] was seen today for weekly treatment management. The chart was checked and CBCT  were reviewed. She continues to tolerate her treatments quite well without any breathing problems swallowing difficulties or fatigue.  Review of patient's allergies indicates no known allergies.  Current Outpatient Prescriptions  Medication Sig Dispense Refill  . albuterol (PROVENTIL HFA;VENTOLIN HFA) 108 (90 BASE) MCG/ACT inhaler Inhale 2 puffs into the lungs every 6 (six) hours as needed for wheezing.  1 Inhaler  6  . amLODipine (NORVASC) 10 MG tablet Take 1 tablet (10 mg total) by mouth daily.  90 tablet  3  . Artificial Tear Ointment (DRY EYES OP) Place 1 drop into both eyes 3 (three) times daily as needed. For dry eyes      . aspirin 81 MG chewable tablet Chew 81 mg by mouth daily.        . Calcium Carbonate-Vitamin D (CALTRATE 600+D) 600-400 MG-UNIT per tablet Take 2 tablets by mouth daily.       . ferrous sulfate 325 (65 FE) MG tablet Take 325 mg by mouth daily.      . fluticasone (FLONASE) 50 MCG/ACT nasal spray Place 2 sprays into the nose daily.  16 g  6  . metoprolol succinate (TOPROL-XL) 50 MG 24 hr tablet Take 1 tablet (50 mg total) by mouth daily. Take with or immediately following a meal.  30 tablet  11  .  Multiple Vitamins-Minerals (CENTRUM SILVER PO) Take 1 tablet by mouth daily.       Marland Kitchen olmesartan-hydrochlorothiazide (BENICAR HCT) 40-25 MG per tablet Take 1 tablet by mouth daily.  30 tablet  11  . prochlorperazine (COMPAZINE) 10 MG tablet Take 1 tablet (10 mg total) by mouth every 6 (six) hours as needed.  60 tablet  0  . simvastatin (ZOCOR) 20 MG tablet Take 1 tablet (20 mg total) by mouth daily.  90 tablet  3   Labs:  Lab Results  Component Value Date   WBC 7.0 01/13/2012   HGB 10.8* 01/13/2012   HCT 31.3* 01/13/2012   MCV 86.0 01/13/2012   PLT 257 01/13/2012   Lab Results  Component Value Date   CREATININE 1.3* 01/13/2012   BUN 32.0* 01/13/2012   NA 133* 01/13/2012   K 3.9 01/13/2012   CL 98 01/13/2012   CO2 27 01/13/2012   Lab Results  Component Value Date   ALT 27 01/13/2012   AST 25 01/13/2012   PHOS 3.4 03/15/2010   BILITOT <0.20 Repeated and  Verified 01/13/2012    Physical Examination:  vitals were not taken for this visit.   Wt Readings from Last 3 Encounters:  01/14/12 112 lb 8 oz (51.03 kg)  01/07/12 116 lb (52.617 kg)  01/06/12 114 lb 11.2 oz (52.028 kg)    No palpable supraclavicular or axillary adenopathy. Lungs - Normal respiratory effort, chest expands symmetrically. Lungs are clear to auscultation, no crackles or wheezes.  Heart has regular rhythm and rate  Abdomen is soft and non tender with normal bowel sounds  Assessment:  Patient tolerating treatments well  Plan: Continue treatment per original radiation prescription

## 2012-01-14 NOTE — Progress Notes (Signed)
Subjective:    Patient ID: Pamela Levine, female    DOB: 1945/09/17, 66 y.o.   MRN: [id number]  HPI Here for f/u of HTN   Since visit 12/3 - adv to continue amlodipine 10 and add back toprol xl 50 and start benicar hct 40-25 once daily Is not having any side effects at all    bp is stable today  No cp or palpitations or headaches or edema  No side effects to medicines  BP Readings from Last 3 Encounters:  01/14/12 160/66  01/14/12 165/74  01/13/12 166/63    This is improved   Systolic was over 190 12/3    Chemistry      Component Value Date/Time   NA 133* 01/13/2012 1432   NA 141 11/26/2011 1430   K 3.9 01/13/2012 1432   K 3.4* 11/26/2011 1430   CL 98 01/13/2012 1432   CL 101 11/26/2011 1430   CO2 27 01/13/2012 1432   CO2 30 11/26/2011 1430   BUN 32.0* 01/13/2012 1432   BUN 29* 11/26/2011 1430   CREATININE 1.3* 01/13/2012 1432   CREATININE 1.27* 11/26/2011 1430      Component Value Date/Time   CALCIUM 8.5 01/13/2012 1432   CALCIUM 10.0 11/26/2011 1430   ALKPHOS 110 01/13/2012 1432   ALKPHOS 100 11/26/2011 1430   AST 25 01/13/2012 1432   AST 25 11/26/2011 1430   ALT 27 01/13/2012 1432   ALT 17 11/26/2011 1430   BILITOT <0.20 Repeated and Verified 01/13/2012 1432   BILITOT 0.1* 11/26/2011 1430     being watched by oncology  So far so good with her oncology/ chemo/radiation Eating and appetite are still ok     Patient Active Problem List  Diagnosis  . Malignant neoplasm of skin of parts of face  . HYPOGLYCEMIA, UNSPECIFIED  . HYPERLIPIDEMIA  . UNSPECIFIED ANEMIA  . HYPERTENSION  . ALLERGIC RHINITIS, SEASONAL  . HYPERGLYCEMIA  . TOBACCO USE, QUIT  . CARCINOMA, BASAL CELL, FACE  . Heart murmur  . Routine general medical examination at a health care facility  . Weight loss, non-intentional  . Other screening mammogram  . Abnormal chest x-ray  . Colon cancer screening  . Ascending aortic aneurysm  . Pleural disorder  . COPD (chronic obstructive  pulmonary disease)  . Mass of lung  . Lung cancer  . ETD (eustachian tube dysfunction)   Past Medical History  Diagnosis Date  . Hyperlipidemia   . Hypertension   . History  of basal cell carcinoma   . Light cigarette smoker   . Kidney stone 01/2000    stent. Renal U/S normal in 12/01.  Marland Kitchen Heart murmur   . Arthritis   . Osteoarthritis   . Cancer     skin  . Lung mass October 2013    RUL  . COPD (chronic obstructive pulmonary disease)   . Ascending aortic aneurysm   . Lung cancer 12/19/2011   Past Surgical History  Procedure Date  . Partial hysterectomy 1978    bleeding  . Kidney stone surgery 01/2000    stent  . Colonoscopy   . Video bronchoscopy with endobronchial ultrasound 11/27/2011    Procedure: VIDEO BRONCHOSCOPY WITH ENDOBRONCHIAL ULTRASOUND;  Surgeon: Leslye Peer, MD;  Location: [facility] OR;  Service: Pulmonary;  Laterality: N/A;   History  Substance Use Topics  . Smoking status: Former Smoker -- 25 years    Types: Cigarettes    Quit date: 01/29/2007  . Smokeless tobacco: Not  on file  . Alcohol Use: Yes     Comment: occasional   Family History  Problem Relation Age of Onset  . Lung cancer Mother     lung- non smoker  . Hypertension Father   . Stroke Father     cerebral hemorrhage  . Lung cancer Sister     lung  . Diabetes Son    No Known Allergies Current Outpatient Prescriptions on File Prior to Visit  Medication Sig Dispense Refill  . amLODipine (NORVASC) 10 MG tablet Take 1 tablet (10 mg total) by mouth daily.  90 tablet  3  . Artificial Tear Ointment (DRY EYES OP) Place 1 drop into both eyes 3 (three) times daily as needed. For dry eyes      . aspirin 81 MG chewable tablet Chew 81 mg by mouth daily.        . Calcium Carbonate-Vitamin D (CALTRATE 600+D) 600-400 MG-UNIT per tablet Take 2 tablets by mouth daily.       . ferrous sulfate 325 (65 FE) MG tablet Take 325 mg by mouth daily.      . fluticasone (FLONASE) 50 MCG/ACT nasal spray Place 2 sprays  into the nose daily.  16 g  6  . metoprolol succinate (TOPROL-XL) 50 MG 24 hr tablet Take 1 tablet (50 mg total) by mouth daily. Take with or immediately following a meal.  30 tablet  11  . Multiple Vitamins-Minerals (CENTRUM SILVER PO) Take 1 tablet by mouth daily.       Marland Kitchen olmesartan-hydrochlorothiazide (BENICAR HCT) 40-25 MG per tablet Take 1 tablet by mouth daily.  30 tablet  11  . simvastatin (ZOCOR) 20 MG tablet Take 1 tablet (20 mg total) by mouth daily.  90 tablet  3  . prochlorperazine (COMPAZINE) 10 MG tablet Take 1 tablet (10 mg total) by mouth every 6 (six) hours as needed.  60 tablet  0    Review of Systems Review of Systems  Constitutional: Negative for fever, appetite change,  and unexpected weight change. pos for fatigue from her cancer treatments  Eyes: Negative for pain and visual disturbance.  Respiratory: Negative for cough and shortness of breath.   Cardiovascular: Negative for cp or palpitations    Gastrointestinal: Negative for nausea, diarrhea and constipation.  Genitourinary: Negative for urgency and frequency.  Skin: Negative for pallor or rash   Neurological: Negative for weakness, light-headedness, numbness and headaches.  Hematological: Negative for adenopathy. Does not bruise/bleed easily.  Psychiatric/Behavioral: Negative for dysphoric mood. The patient is not nervous/anxious.         Objective:   Physical Exam  Constitutional: She appears well-developed and well-nourished. No distress.  HENT:  Head: Normocephalic and atraumatic.  Mouth/Throat: Oropharynx is clear and moist.  Eyes: Conjunctivae normal and EOM are normal. Pupils are equal, round, and reactive to light. Right eye exhibits no discharge. Left eye exhibits no discharge. No scleral icterus.  Neck: Normal range of motion. Neck supple. No JVD present. Carotid bruit is not present. No thyromegaly present.  Cardiovascular: Normal rate, regular rhythm, normal heart sounds and intact distal pulses.   Exam reveals no gallop.   Pulmonary/Chest: Effort normal and breath sounds normal. No respiratory distress. She has no wheezes.       Diffusely distant bs   Abdominal: Soft. Bowel sounds are normal. She exhibits no distension, no abdominal bruit and no mass. There is no tenderness.  Musculoskeletal: She exhibits no edema.  Lymphadenopathy:    She has no cervical  adenopathy.  Neurological: She is alert. She has normal reflexes. No cranial nerve deficit. She exhibits normal muscle tone. Coordination normal.  Skin: Skin is warm and dry. No rash noted. No erythema. No pallor.  Psychiatric: She has a normal mood and affect.          Assessment & Plan:

## 2012-01-14 NOTE — Progress Notes (Signed)
Patient here for routine weekly assessment of right lung cancer radiation treatment.Completed 8 of 33 treatments.Completed 3 rounds of chemotherapy.Tolerating therapy ok.3 lb weight loss in last week.Denies pain or nausea.Eating well just has continued weight loss.Nutritionist scheduled for 12/31, I  will call Barb to see if patient can be seen in infusion room next week.

## 2012-01-15 ENCOUNTER — Encounter: Payer: Self-pay | Admitting: *Deleted

## 2012-01-15 ENCOUNTER — Ambulatory Visit
Admission: RE | Admit: 2012-01-15 | Discharge: 2012-01-15 | Disposition: A | Discharge status: Home / Self Care | Payer: BC Managed Care – PPO | Source: Ambulatory Visit | Attending: Radiation Oncology | Admitting: Radiation Oncology

## 2012-01-16 ENCOUNTER — Ambulatory Visit
Admission: RE | Admit: 2012-01-16 | Discharge: 2012-01-16 | Disposition: A | Discharge status: Home / Self Care | Payer: BC Managed Care – PPO | Source: Ambulatory Visit | Attending: Radiation Oncology | Admitting: Radiation Oncology

## 2012-01-17 ENCOUNTER — Ambulatory Visit
Admission: RE | Admit: 2012-01-17 | Discharge: 2012-01-17 | Disposition: A | Discharge status: Home / Self Care | Payer: BC Managed Care – PPO | Source: Ambulatory Visit | Attending: Radiation Oncology | Admitting: Radiation Oncology

## 2012-01-20 ENCOUNTER — Ambulatory Visit (HOSPITAL_BASED_OUTPATIENT_CLINIC_OR_DEPARTMENT_OTHER): Payer: BC Managed Care – PPO | Admitting: Physician Assistant

## 2012-01-20 ENCOUNTER — Ambulatory Visit (HOSPITAL_BASED_OUTPATIENT_CLINIC_OR_DEPARTMENT_OTHER): Payer: BC Managed Care – PPO

## 2012-01-20 ENCOUNTER — Telehealth: Payer: Self-pay | Admitting: Internal Medicine

## 2012-01-20 ENCOUNTER — Other Ambulatory Visit: Payer: BC Managed Care – PPO | Admitting: Lab

## 2012-01-20 ENCOUNTER — Other Ambulatory Visit (HOSPITAL_BASED_OUTPATIENT_CLINIC_OR_DEPARTMENT_OTHER): Payer: BC Managed Care – PPO | Admitting: Lab

## 2012-01-20 ENCOUNTER — Encounter: Payer: Self-pay | Admitting: Physician Assistant

## 2012-01-20 ENCOUNTER — Ambulatory Visit
Admission: RE | Admit: 2012-01-20 | Discharge: 2012-01-20 | Disposition: A | Discharge status: Home / Self Care | Payer: BC Managed Care – PPO | Source: Ambulatory Visit | Attending: Radiation Oncology | Admitting: Radiation Oncology

## 2012-01-20 VITALS — BP 152/72 | HR 74 | Temp 97.0°F | Resp 18 | Ht 67.5 in | Wt 115.8 lb

## 2012-01-20 DIAGNOSIS — C3410 Malignant neoplasm of upper lobe, unspecified bronchus or lung: Secondary | ICD-10-CM

## 2012-01-20 DIAGNOSIS — C3490 Malignant neoplasm of unspecified part of unspecified bronchus or lung: Secondary | ICD-10-CM

## 2012-01-20 DIAGNOSIS — Z5111 Encounter for antineoplastic chemotherapy: Secondary | ICD-10-CM

## 2012-01-20 LAB — CBC WITH DIFFERENTIAL/PLATELET
Basophils Absolute: 0.1 10*3/uL (ref 0.0–0.1)
Eosinophils Absolute: 0.1 10*3/uL (ref 0.0–0.5)
HGB: 10.7 g/dL — ABNORMAL LOW (ref 11.6–15.9)
LYMPH%: 12.3 % — ABNORMAL LOW (ref 14.0–49.7)
MCV: 85.6 fL (ref 79.5–101.0)
MONO#: 0.4 10*3/uL (ref 0.1–0.9)
MONO%: 9.4 % (ref 0.0–14.0)
NEUT#: 3.3 10*3/uL (ref 1.5–6.5)
Platelets: 179 10*3/uL (ref 145–400)
RBC: 3.61 10*6/uL — ABNORMAL LOW (ref 3.70–5.45)
RDW: 13.8 % (ref 11.2–14.5)
WBC: 4.5 10*3/uL (ref 3.9–10.3)
nRBC: 0 % (ref 0–0)

## 2012-01-20 LAB — COMPREHENSIVE METABOLIC PANEL (CC13)
ALT: 41 U/L (ref 0–55)
Alkaline Phosphatase: 97 U/L (ref 40–150)
CO2: 31 mEq/L — ABNORMAL HIGH (ref 22–29)
Sodium: 137 mEq/L (ref 136–145)
Total Bilirubin: 0.28 mg/dL (ref 0.20–1.20)
Total Protein: 6.7 g/dL (ref 6.4–8.3)

## 2012-01-20 MED ORDER — FAMOTIDINE IN NACL 20-0.9 MG/50ML-% IV SOLN
20.0000 mg | Freq: Once | INTRAVENOUS | Status: AC
  Administered 2012-01-20: 20 mg via INTRAVENOUS

## 2012-01-20 MED ORDER — ONDANSETRON 16 MG/50ML IVPB (CHCC)
16.0000 mg | Freq: Once | INTRAVENOUS | Status: AC
  Administered 2012-01-20: 16 mg via INTRAVENOUS

## 2012-01-20 MED ORDER — SODIUM CHLORIDE 0.9 % IV SOLN
Freq: Once | INTRAVENOUS | Status: AC
  Administered 2012-01-20: 11:00:00 via INTRAVENOUS

## 2012-01-20 MED ORDER — SODIUM CHLORIDE 0.9 % IV SOLN
120.0000 mg | Freq: Once | INTRAVENOUS | Status: AC
  Administered 2012-01-20: 120 mg via INTRAVENOUS
  Filled 2012-01-20: qty 12

## 2012-01-20 MED ORDER — PACLITAXEL CHEMO INJECTION 300 MG/50ML
45.0000 mg/m2 | Freq: Once | INTRAVENOUS | Status: AC
  Administered 2012-01-20: 72 mg via INTRAVENOUS
  Filled 2012-01-20: qty 12

## 2012-01-20 MED ORDER — DEXAMETHASONE SODIUM PHOSPHATE 4 MG/ML IJ SOLN
20.0000 mg | Freq: Once | INTRAMUSCULAR | Status: AC
  Administered 2012-01-20: 20 mg via INTRAVENOUS

## 2012-01-20 MED ORDER — DIPHENHYDRAMINE HCL 50 MG/ML IJ SOLN
25.0000 mg | Freq: Once | INTRAMUSCULAR | Status: AC
  Administered 2012-01-20: 12:00:00 via INTRAVENOUS

## 2012-01-20 NOTE — Patient Instructions (Addendum)
Continue your course of concurrent chemoradiation as scheduled Follow up in 3 weeks 

## 2012-01-20 NOTE — Telephone Encounter (Signed)
gv and printed appt schedule for Dec and Jan

## 2012-01-20 NOTE — Progress Notes (Signed)
[facility] Telephone:([phone]) [phone]   [phone]  OFFICE PROGRESS NOTE  [name], MD [address] [place] [address], [place] [address]  DIAGNOSIS: Stage IIIA (T1a, N2, M0) non-small cell lung cancer, squamous cell carcinoma diagnosed in October of 2013.  PRIOR THERAPY: None  CURRENT THERAPY: Concurrent chemoradiation with weekly carboplatin for AUC of 2 and paclitaxel 45 mg/M2  INTERVAL HISTORY: Pamela Levine 66 y.o. female returns to the clinic today for routine followup visit accompanied by a friend. Overall she's tolerating her course of concurrent chemoradiation relatively well. She does however complain of right list legs and generalized twitching related to the Benadryl and her premedications. She is wondering if this dose can be modified.  She denied having any significant fever or chills. She denied having any nausea or vomiting. She has no significant chest pain, shortness breath, cough or hemoptysis. She has no weight loss or night sweats.  MEDICAL HISTORY: Past Medical History  Diagnosis Date  . Hyperlipidemia   . Hypertension   . History  of basal cell carcinoma   . Light cigarette smoker   . Kidney stone 01/2000    stent. Renal U/S normal in 12/01.  Marland Kitchen Heart murmur   . Arthritis   . Osteoarthritis   . Cancer     skin  . Lung mass October 2013    RUL  . COPD (chronic obstructive pulmonary disease)   . Ascending aortic aneurysm   . Lung cancer 12/19/2011    ALLERGIES:   has no known allergies.  MEDICATIONS:  Current Outpatient Prescriptions  Medication Sig Dispense Refill  . amLODipine (NORVASC) 10 MG tablet Take 1 tablet (10 mg total) by mouth daily.  90 tablet  3  . Artificial Tear Ointment (DRY EYES OP) Place 1 drop into both eyes 3 (three) times daily as needed. For dry eyes      . aspirin 81 MG chewable tablet Chew 81 mg by mouth daily.        . Calcium Carbonate-Vitamin D (CALTRATE 600+D) 600-400  MG-UNIT per tablet Take 2 tablets by mouth daily.       . ferrous sulfate 325 (65 FE) MG tablet Take 325 mg by mouth daily.      . fluticasone (FLONASE) 50 MCG/ACT nasal spray Place 2 sprays into the nose daily.  16 g  6  . metoprolol succinate (TOPROL-XL) 50 MG 24 hr tablet Take 1 tablet (50 mg total) by mouth daily. Take with or immediately following a meal.  90 tablet  3  . Multiple Vitamins-Minerals (CENTRUM SILVER PO) Take 1 tablet by mouth daily.       Marland Kitchen olmesartan-hydrochlorothiazide (BENICAR HCT) 40-25 MG per tablet Take 1 tablet by mouth daily.  90 tablet  3  . prochlorperazine (COMPAZINE) 10 MG tablet Take 1 tablet (10 mg total) by mouth every 6 (six) hours as needed.  60 tablet  0  . simvastatin (ZOCOR) 20 MG tablet Take 1 tablet (20 mg total) by mouth daily.  90 tablet  3   No current facility-administered medications for this visit.   Facility-Administered Medications Ordered in Other Visits  Medication Dose Route Frequency Provider Last Rate Last Dose  . CARBOplatin (PARAPLATIN) 120 mg in sodium chloride 0.9 % 100 mL chemo infusion  120 mg Intravenous Once [name], MD 224 mL/hr at 01/20/12 1323 120 mg at 01/20/12 1323    SURGICAL HISTORY:  Past Surgical History  Procedure  Date  . Partial hysterectomy 1978    bleeding  . Kidney stone surgery 01/2000    stent  . Colonoscopy   . Video bronchoscopy with endobronchial ultrasound 11/27/2011    Procedure: VIDEO BRONCHOSCOPY WITH ENDOBRONCHIAL ULTRASOUND;  Surgeon: Leslye Peer, MD;  Location: [facility] OR;  Service: Pulmonary;  Laterality: N/A;    REVIEW OF SYSTEMS:  Pertinent items are noted in HPI.   PHYSICAL EXAMINATION: General appearance: alert, cooperative and no distress Head: Normocephalic, without obvious abnormality, atraumatic Neck: no adenopathy Lymph nodes: Cervical, supraclavicular, and axillary nodes normal. Resp: clear to auscultation bilaterally Cardio: regular rate and rhythm, S1, S2 normal, no murmur,  click, rub or gallop GI: soft, non-tender; bowel sounds normal; no masses,  no organomegaly Extremities: extremities normal, atraumatic, no cyanosis or edema Neurologic: Grossly normal  ECOG PERFORMANCE STATUS: 1 - Symptomatic but completely ambulatory  Blood pressure 152/72, pulse 74, temperature 97 F (36.1 C), temperature source Oral, resp. rate 18, height 5' 7.5" (1.715 m), weight 115 lb 12.8 oz (52.527 kg), last menstrual period 01/29/1976.  LABORATORY DATA: Lab Results  Component Value Date   WBC 4.5 01/20/2012   HGB 10.7* 01/20/2012   HCT 30.9* 01/20/2012   MCV 85.6 01/20/2012   PLT 179 01/20/2012      Chemistry      Component Value Date/Time   NA 137 01/20/2012 0912   NA 141 11/26/2011 1430   K 4.3 01/20/2012 0912   K 3.4* 11/26/2011 1430   CL 95* 01/20/2012 0912   CL 101 11/26/2011 1430   CO2 31* 01/20/2012 0912   CO2 30 11/26/2011 1430   BUN 29.0* 01/20/2012 0912   BUN 29* 11/26/2011 1430   CREATININE 1.2* 01/20/2012 0912   CREATININE 1.27* 11/26/2011 1430      Component Value Date/Time   CALCIUM 10.1 01/20/2012 0912   CALCIUM 10.0 11/26/2011 1430   ALKPHOS 97 01/20/2012 0912   ALKPHOS 100 11/26/2011 1430   AST 31 01/20/2012 0912   AST 25 11/26/2011 1430   ALT 41 01/20/2012 0912   ALT 17 11/26/2011 1430   BILITOT 0.28 01/20/2012 0912   BILITOT 0.1* 11/26/2011 1430       RADIOGRAPHIC STUDIES: Dg Chest 1 View  12/10/2011  *RADIOLOGY REPORT*  Clinical Data: Post biopsy  CHEST - 1 VIEW  Comparison: 11/27/2011; 11/05/2011; chest CT - 10/23/2011; PET CT - 11/14/2011; CT guided right upper lobe lung mass biopsy - earlier same day  Findings:  Grossly unchanged enlarged cardiac silhouette and mediastinal contours with atherosclerotic calcifications within the aortic arch.  There is grossly unchanged asymmetric right apical pleuroparenchymal thickening.  No definite pneumothorax post right upper lobe mass biopsy.  Grossly unchanged pleural parenchymal thickening  of the left lung apex.  The lungs again appear hyperinflated with mild diffuse thickening of the pulmonary interstitium.  Linear heterogeneous opacities in the right mid lung are grossly unchanged favored to represent atelectasis or scar.  No new focal airspace opacities.  No definite pleural effusion, though note, the right costophrenic angle is excluded from view.  IMPRESSION:  1. No definite pneumothorax post right upper lobe biopsy. 2. Hyperexpanded lungs and bronchitic change without definite acute cardiopulmonary disease.   Original Report Authenticated By: Tacey Ruiz, MD    Mr Brain W Wo Contrast  12/29/2011  *RADIOLOGY REPORT*  Clinical Data: New diagnosis lung cancer.  Staging.  MRI HEAD WITHOUT AND WITH CONTRAST  Technique:  Multiplanar, multiecho pulse sequences of the brain and surrounding structures  were obtained according to standard protocol without and with intravenous contrast  Contrast: 10mL MULTIHANCE GADOBENATE DIMEGLUMINE 529 MG/ML IV SOLN  Comparison: None.  Findings: Ventricle size is normal.  Negative for acute infarct. Minimal chronic ischemic changes are present.  No mass or edema is present.  Negative for intracranial hemorrhage or fluid collection. No shift of the midline structures.  Postcontrast imaging of the brain reveals normal enhancement.  No enhancing mass or abnormal leptomeningeal enhancement is identified.  IMPRESSION: Negative for metastatic disease.  No acute intracranial abnormality.   Original Report Authenticated By: Janeece Riggers, M.D.    Ct Biopsy  12/10/2011  *RADIOLOGY REPORT*  Clinical Data/Indication: RIGHT UPPER LOBE MASS  CT-GUIDED BIOPSY of a right upper lobe mass.  Core.  Sedation: Versed 2.0 mg, Fentanyl 100 mcg.  Total Moderate Sedation Time: 19 minutes.  Procedure: The procedure, risks, benefits, and alternatives were explained to the patient. Questions regarding the procedure were encouraged and answered. The patient understands and consents to the  procedure.  The right posterior upper thorax was prepped with betadine in a sterile fashion, and a sterile drape was applied covering the operative field. A mask and sterile gloves were used for the procedure.  Under CT guidance, the 17 gauge needle was inserted into the right upper lobe mass.  Three 18 gauge cores were obtained and placed in formalin for surgical pathology.  A fourth core was obtained and placed in saline for tissue culture.  The guide needle was removed. Final imaging was performed.  Patient tolerated the procedure well without complication.  Vital sign monitoring by nursing staff during the procedure will continue as patient is in the special procedures unit for post procedure observation.  Findings: The images document guide needle placement within the right upper lobe mass.  Post biopsy images demonstrate no pneumothorax.  IMPRESSION: Successful CT-guided core biopsy of a right upper lobe mass or surgical pathology and tissue culture.   Original Report Authenticated By: Jolaine Click, M.D.     ASSESSMENT/PLAN: This is a very pleasant 66 years old white female with history of stage IIIa non-small cell lung cancer, squamous cell carcinoma currently undergoing concurrent chemoradiation with weekly carboplatin and paclitaxel.  The patient is doing fine and tolerating her chemotherapy fairly well. The patient was discussed Dr. Arbutus Ped. We will decrease the Benadryl 25 mg in her premedications. Hopefully this will decrease the restless leg and generalized "twitching". She has radiation therapy through 02/20/2012. We will add 2 weeks of weekly chemotherapy to correspond with her radiation therapy. She'll followup in 3 weeks for another symptom management visit.  Laural Benes, Zowie Lundahl E, PA-C   All questions were answered. The patient knows to call the clinic with any problems, questions or concerns. We can certainly see the patient much sooner if necessary.  I spent 20 minutes counseling the patient  face to face. The total time spent in the appointment was 30 minutes.

## 2012-01-20 NOTE — Patient Instructions (Signed)
[facility] Discharge Instructions for Patients Receiving Chemotherapy  Today you received the following chemotherapy agents Taxol/Carboplatin.  To help prevent nausea and vomiting after your treatment, we encourage you to take your nausea medication as directed.   If you develop nausea and vomiting that is not controlled by your nausea medication, call the clinic. If it is after clinic hours your family physician or the after hours number for the clinic or go to the Emergency Department.   BELOW ARE SYMPTOMS THAT SHOULD BE REPORTED IMMEDIATELY:  *FEVER GREATER THAN 100.5 F  *CHILLS WITH OR WITHOUT FEVER  NAUSEA AND VOMITING THAT IS NOT CONTROLLED WITH YOUR NAUSEA MEDICATION  *UNUSUAL SHORTNESS OF BREATH  *UNUSUAL BRUISING OR BLEEDING  TENDERNESS IN MOUTH AND THROAT WITH OR WITHOUT PRESENCE OF ULCERS  *URINARY PROBLEMS  *BOWEL PROBLEMS  UNUSUAL RASH Items with * indicate a potential emergency and should be followed up as soon as possible.  Feel free to call the clinic you have any questions or concerns. The clinic phone number is [phone].

## 2012-01-20 NOTE — Telephone Encounter (Signed)
Per staff message and POF I have schedueld appts.  Pamela Levine  

## 2012-01-21 ENCOUNTER — Encounter: Payer: Self-pay | Admitting: Radiation Oncology

## 2012-01-21 ENCOUNTER — Ambulatory Visit
Admission: RE | Admit: 2012-01-21 | Discharge: 2012-01-21 | Disposition: A | Discharge status: Home / Self Care | Payer: BC Managed Care – PPO | Source: Ambulatory Visit | Attending: Radiation Oncology | Admitting: Radiation Oncology

## 2012-01-21 ENCOUNTER — Telehealth: Payer: Self-pay | Admitting: *Deleted

## 2012-01-21 VITALS — BP 162/73 | HR 83 | Temp 97.6°F | Resp 20 | Wt 117.3 lb

## 2012-01-21 DIAGNOSIS — C3490 Malignant neoplasm of unspecified part of unspecified bronchus or lung: Secondary | ICD-10-CM

## 2012-01-21 MED ORDER — METHYLPREDNISOLONE 4 MG PO KIT: PACK | ORAL | Status: DC

## 2012-01-21 MED ORDER — BIAFINE EX EMUL
Freq: Every day | CUTANEOUS | Status: DC
  Administered 2012-01-21: 11:00:00 via TOPICAL

## 2012-01-21 MED ORDER — EMOLLIENT BASE EX CREA: TOPICAL_CREAM | CUTANEOUS | Status: DC | PRN

## 2012-01-21 NOTE — Progress Notes (Signed)
[facility]    Radiation Oncology [address] [place]     [name], M.D. [place], [place] [address]               [name], M.D., Ph.D. Phone: [phone]      [name], M.D. Fax: [phone]      [name], M.D., Ph.D.         [name], M.D.         [name], M.D Weekly Treatment Management Note  Name: KEISI ECKFORD     MRN: [id number]        CSN: [id number] Date: 01/21/2012      DOB: [birth year]  CC: [name], MD         [facility]    Status: Outpatient  Diagnosis: The encounter diagnosis was Lung cancer.  Current Dose: 27 Gy  Current Fraction: 15  Planned Dose: 63 Gy  Narrative: [name] was seen today for weekly treatment management. The chart was checked and CBCT  were reviewed. She is tolerating her treatments well except for itching. She denies any breathing problems or swallowing difficulties.  Review of patient's allergies indicates no known allergies.  Current Outpatient Prescriptions  Medication Sig Dispense Refill  . amLODipine (NORVASC) 10 MG tablet Take 1 tablet (10 mg total) by mouth daily.  90 tablet  3  . Artificial Tear Ointment (DRY EYES OP) Place 1 drop into both eyes 3 (three) times daily as needed. For dry eyes      . aspirin 81 MG chewable tablet Chew 81 mg by mouth daily.        . Calcium Carbonate-Vitamin D (CALTRATE 600+D) 600-400 MG-UNIT per tablet Take 2 tablets by mouth daily.       . ferrous sulfate 325 (65 FE) MG tablet Take 325 mg by mouth daily.      . fluticasone (FLONASE) 50 MCG/ACT nasal spray Place 2 sprays into the nose daily.  16 g  6  . metoprolol succinate (TOPROL-XL) 50 MG 24 hr tablet Take 1 tablet (50 mg total) by mouth daily. Take with or immediately following a meal.  90 tablet  3  . Multiple Vitamins-Minerals (CENTRUM SILVER PO) Take 1 tablet by mouth daily.       Marland Kitchen olmesartan-hydrochlorothiazide (BENICAR HCT) 40-25 MG per tablet Take 1 tablet by mouth daily.  90  tablet  3  . prochlorperazine (COMPAZINE) 10 MG tablet Take 10 mg by mouth every 6 (six) hours as needed.      . simvastatin (ZOCOR) 20 MG tablet Take 1 tablet (20 mg total) by mouth daily.  90 tablet  3  . methylPREDNISolone (MEDROL, PAK,) 4 MG tablet follow package directions  21 tablet  0   Labs:  Lab Results  Component Value Date   WBC 4.5 01/20/2012   HGB 10.7* 01/20/2012   HCT 30.9* 01/20/2012   MCV 85.6 01/20/2012   PLT 179 01/20/2012   Lab Results  Component Value Date   CREATININE 1.2* 01/20/2012   BUN 29.0* 01/20/2012   NA 137 01/20/2012   K 4.3 01/20/2012   CL 95* 01/20/2012   CO2 31* 01/20/2012   Lab Results  Component Value Date   ALT 41 01/20/2012   AST 31 01/20/2012   PHOS 3.4 03/15/2010   BILITOT 0.28 01/20/2012    Physical Examination:  weight is 117 lb 4.8 oz (53.207 kg). Her oral temperature is 97.6 F (36.4 C). Her  blood pressure is 162/73 and her pulse is 83. Her respiration is 20 and oxygen saturation is 99%.    Wt Readings from Last 3 Encounters:  01/21/12 117 lb 4.8 oz (53.207 kg)  01/20/12 115 lb 12.8 oz (52.527 kg)  01/14/12 115 lb (52.164 kg)    The skin of the trunk area shows a maculopapular rash. In the upper back region the patient has some radiation dermatitis but no skin breakdown. Lungs - Normal respiratory effort, chest expands symmetrically. Lungs are clear to auscultation, no crackles or wheezes.  Heart has regular rhythm and rate  Abdomen is soft and non tender with normal bowel sounds  Assessment:  Patient tolerating treatments well except for issues as above  Plan: Continue treatment per original radiation prescription.  Patient may have possibly developed a rash related to her chemotherapy. I did discuss these issues with Dr. Arbutus Ped today. Patient is somewhat intolerant of Benadryl with twitching and restless legs. In light of this the patient will be placed on a Medrol Dosepak for her rash.  She is advised to present to the  emergency room in [facility] if her rash becomes more significant or if she develops breathing or swallowing difficulties.

## 2012-01-21 NOTE — Telephone Encounter (Signed)
error 

## 2012-01-21 NOTE — Addendum Note (Signed)
Encounter addended by: Glennie Hawk, RN on: 01/21/2012 10:55 AM<BR>     Documentation filed: Inpatient MAR, Orders

## 2012-01-21 NOTE — Progress Notes (Signed)
Pt denies pain, fatigue, loss of appetite. Applying lotion to chest/back.

## 2012-01-23 ENCOUNTER — Ambulatory Visit
Admission: RE | Admit: 2012-01-23 | Discharge: 2012-01-23 | Disposition: A | Discharge status: Home / Self Care | Payer: BC Managed Care – PPO | Source: Ambulatory Visit | Attending: Radiation Oncology | Admitting: Radiation Oncology

## 2012-01-24 ENCOUNTER — Ambulatory Visit: Admission: RE | Admit: 2012-01-24 | Payer: BC Managed Care – PPO | Source: Ambulatory Visit

## 2012-01-24 ENCOUNTER — Other Ambulatory Visit: Payer: Self-pay | Admitting: *Deleted

## 2012-01-24 LAB — AFB CULTURE WITH SMEAR (NOT AT ARMC): Acid Fast Smear: NONE SEEN

## 2012-01-27 ENCOUNTER — Telehealth: Payer: Self-pay | Admitting: *Deleted

## 2012-01-27 ENCOUNTER — Other Ambulatory Visit (HOSPITAL_BASED_OUTPATIENT_CLINIC_OR_DEPARTMENT_OTHER): Payer: BC Managed Care – PPO | Admitting: Lab

## 2012-01-27 ENCOUNTER — Ambulatory Visit
Admission: RE | Admit: 2012-01-27 | Discharge: 2012-01-27 | Disposition: A | Discharge status: Home / Self Care | Payer: BC Managed Care – PPO | Source: Ambulatory Visit | Attending: Radiation Oncology | Admitting: Radiation Oncology

## 2012-01-27 ENCOUNTER — Ambulatory Visit (HOSPITAL_BASED_OUTPATIENT_CLINIC_OR_DEPARTMENT_OTHER): Payer: BC Managed Care – PPO

## 2012-01-27 ENCOUNTER — Other Ambulatory Visit: Payer: Self-pay | Admitting: Internal Medicine

## 2012-01-27 DIAGNOSIS — C3410 Malignant neoplasm of upper lobe, unspecified bronchus or lung: Secondary | ICD-10-CM

## 2012-01-27 DIAGNOSIS — C3490 Malignant neoplasm of unspecified part of unspecified bronchus or lung: Secondary | ICD-10-CM

## 2012-01-27 DIAGNOSIS — Z5111 Encounter for antineoplastic chemotherapy: Secondary | ICD-10-CM

## 2012-01-27 LAB — COMPREHENSIVE METABOLIC PANEL (CC13)
ALT: 64 U/L — ABNORMAL HIGH (ref 0–55)
Alkaline Phosphatase: 111 U/L (ref 40–150)
Sodium: 137 mEq/L (ref 136–145)
Total Bilirubin: 0.34 mg/dL (ref 0.20–1.20)
Total Protein: 6.8 g/dL (ref 6.4–8.3)

## 2012-01-27 LAB — CBC WITH DIFFERENTIAL/PLATELET
EOS%: 1.6 % (ref 0.0–7.0)
MCH: 29.4 pg (ref 25.1–34.0)
MCHC: 34.8 g/dL (ref 31.5–36.0)
MCV: 84.5 fL (ref 79.5–101.0)
MONO%: 6.9 % (ref 0.0–14.0)
RBC: 3.61 10*6/uL — ABNORMAL LOW (ref 3.70–5.45)
RDW: 13.6 % (ref 11.2–14.5)
nRBC: 0 % (ref 0–0)

## 2012-01-27 MED ORDER — ONDANSETRON 16 MG/50ML IVPB (CHCC)
16.0000 mg | Freq: Once | INTRAVENOUS | Status: AC
  Administered 2012-01-27: 16 mg via INTRAVENOUS

## 2012-01-27 MED ORDER — FAMOTIDINE IN NACL 20-0.9 MG/50ML-% IV SOLN
20.0000 mg | Freq: Once | INTRAVENOUS | Status: AC
  Administered 2012-01-27: 20 mg via INTRAVENOUS

## 2012-01-27 MED ORDER — DIPHENHYDRAMINE HCL 50 MG/ML IJ SOLN
25.0000 mg | Freq: Once | INTRAMUSCULAR | Status: AC
  Administered 2012-01-27: 25 mg via INTRAVENOUS

## 2012-01-27 MED ORDER — PACLITAXEL CHEMO INJECTION 300 MG/50ML
45.0000 mg/m2 | Freq: Once | INTRAVENOUS | Status: AC
  Administered 2012-01-27: 72 mg via INTRAVENOUS
  Filled 2012-01-27: qty 12

## 2012-01-27 MED ORDER — DEXAMETHASONE SODIUM PHOSPHATE 4 MG/ML IJ SOLN
20.0000 mg | Freq: Once | INTRAMUSCULAR | Status: AC
  Administered 2012-01-27: 20 mg via INTRAVENOUS

## 2012-01-27 MED ORDER — SODIUM CHLORIDE 0.9 % IV SOLN: Freq: Once | INTRAVENOUS | Status: DC

## 2012-01-27 MED ORDER — SODIUM CHLORIDE 0.9 % IV SOLN
120.0000 mg | Freq: Once | INTRAVENOUS | Status: AC
  Administered 2012-01-27: 120 mg via INTRAVENOUS
  Filled 2012-01-27: qty 12

## 2012-01-27 NOTE — Progress Notes (Signed)
Per Dr. Arbutus Ped- opk to treat despite platelet count.

## 2012-01-27 NOTE — Patient Instructions (Addendum)
[facility] Discharge Instructions for Patients Receiving Chemotherapy  Today you received the following chemotherapy agents: Carboplatin, Taxol.  To help prevent nausea and vomiting after your treatment, we encourage you to take your nausea medication.    If you develop nausea and vomiting that is not controlled by your nausea medication, call the clinic.   BELOW ARE SYMPTOMS THAT SHOULD BE REPORTED IMMEDIATELY:  *FEVER GREATER THAN 100.5 F  *CHILLS WITH OR WITHOUT FEVER  NAUSEA AND VOMITING THAT IS NOT CONTROLLED WITH YOUR NAUSEA MEDICATION  *UNUSUAL SHORTNESS OF BREATH  *UNUSUAL BRUISING OR BLEEDING  TENDERNESS IN MOUTH AND THROAT WITH OR WITHOUT PRESENCE OF ULCERS  *URINARY PROBLEMS  *BOWEL PROBLEMS  UNUSUAL RASH Items with * indicate a potential emergency and should be followed up as soon as possible.  Feel free to call the clinic you have any questions or concerns. The clinic phone number is [phone].    

## 2012-01-27 NOTE — Telephone Encounter (Signed)
Disability paperwork signed by Dr Donnald Garre and returned to Lilyan Punt in medical mgmt.  SLJ

## 2012-01-28 ENCOUNTER — Ambulatory Visit
Admission: RE | Admit: 2012-01-28 | Discharge: 2012-01-28 | Disposition: A | Discharge status: Home / Self Care | Payer: BC Managed Care – PPO | Source: Ambulatory Visit | Attending: Radiation Oncology | Admitting: Radiation Oncology

## 2012-01-28 ENCOUNTER — Ambulatory Visit: Payer: BC Managed Care – PPO | Admitting: Nutrition

## 2012-01-28 VITALS — BP 160/52 | HR 71 | Temp 98.2°F | Wt 116.5 lb

## 2012-01-28 DIAGNOSIS — C3490 Malignant neoplasm of unspecified part of unspecified bronchus or lung: Secondary | ICD-10-CM

## 2012-01-28 NOTE — Progress Notes (Signed)
Weekly Management Note Current Dose: 28.8  Gy  Projected Dose: 50.4 Gy   Narrative:  The patient presents for routine under treatment assessment.  CBCT/MVCT images/Port film x-rays were reviewed.  The chart was checked. Doing well. No complaints except rash on her back. Helped with steroids Dr. Roselind Messier gave her last week.   Physical Findings: Weight: 116 lb 8 oz (52.844 kg). Dermatitis on back.   Impression:  The patient is tolerating radiation.  Plan:  Continue treatment as planned. Continue biafene.

## 2012-01-28 NOTE — Progress Notes (Signed)
This is a 66 year old female patient of Dr. Shirline Frees diagnosed with non-small cell lung cancer receiving concurrent chemoradiation therapy.  Past medical history includes hyperlipidemia, hypertension, tobacco usage, arthritis, and COPD.  Medications include calcium with vitamin D, ferrous sulfate, Medrol Dosepak, multivitamin, Compazine, and Zocor.  Labs include a BUN of 33, creatinine 1.2, and albumin 3.0 on 12-30.  Height: 67-1/2 inches. Weight: 116.5 pounds. Usual body weight: 128 pounds. BMI: 17.97.  Patient reports having no nutritional side effects throughout treatment. She denies nausea vomiting, difficulty swallowing, or problems with her bowels. She has had unintentional weight loss prior to diagnosis. Dietary recall reveals patient does eat 3 meals daily plus one Carnation breakfast essentials. She enjoys and tolerates most foods.  Nutrition diagnosis: Unintended weight loss related to new diagnosis of lung cancer as evidenced by 9% weight loss from usual body weight.  Intervention: I educated patient to increase meals and snacks to a minimum of 5-6 times daily. I've educated her on ways to add calories and include additional protein to her diet. I've encouraged her to continue Carnation breakfast essentials at least once a day. I provided fact sheets and contact information as well as coupons for patient to take with her today. Patient able to teach back strategies to add calories and protein.  Monitoring, evaluation, and goals: Patient will tolerate increased calories and protein to promote weight gain.  Next visit: Patient will contact me if followup needed.

## 2012-01-28 NOTE — Progress Notes (Signed)
Patient here for routine weekly assessment of radiation to lungs.Completed 18 total treatments.Has redness with follicular rash posteriorly and mild redness anterior ly.Applying biafine to affected areas.Denies pain, shortness of breath and cough.

## 2012-01-30 ENCOUNTER — Ambulatory Visit
Admission: RE | Admit: 2012-01-30 | Discharge: 2012-01-30 | Disposition: A | Discharge status: Home / Self Care | Payer: BC Managed Care – PPO | Source: Ambulatory Visit | Attending: Radiation Oncology | Admitting: Radiation Oncology

## 2012-01-31 ENCOUNTER — Ambulatory Visit
Admission: RE | Admit: 2012-01-31 | Discharge: 2012-01-31 | Disposition: A | Discharge status: Home / Self Care | Payer: BC Managed Care – PPO | Source: Ambulatory Visit | Attending: Radiation Oncology | Admitting: Radiation Oncology

## 2012-02-03 ENCOUNTER — Encounter: Payer: Self-pay | Admitting: Internal Medicine

## 2012-02-03 ENCOUNTER — Other Ambulatory Visit (HOSPITAL_BASED_OUTPATIENT_CLINIC_OR_DEPARTMENT_OTHER): Payer: BC Managed Care – PPO | Admitting: Lab

## 2012-02-03 ENCOUNTER — Ambulatory Visit (HOSPITAL_BASED_OUTPATIENT_CLINIC_OR_DEPARTMENT_OTHER): Payer: BC Managed Care – PPO

## 2012-02-03 ENCOUNTER — Ambulatory Visit
Admission: RE | Admit: 2012-02-03 | Discharge: 2012-02-03 | Disposition: A | Discharge status: Home / Self Care | Payer: BC Managed Care – PPO | Source: Ambulatory Visit | Attending: Radiation Oncology | Admitting: Radiation Oncology

## 2012-02-03 VITALS — BP 137/68 | HR 75 | Temp 98.2°F | Resp 20

## 2012-02-03 DIAGNOSIS — C3490 Malignant neoplasm of unspecified part of unspecified bronchus or lung: Secondary | ICD-10-CM

## 2012-02-03 DIAGNOSIS — C3410 Malignant neoplasm of upper lobe, unspecified bronchus or lung: Secondary | ICD-10-CM

## 2012-02-03 DIAGNOSIS — Z5111 Encounter for antineoplastic chemotherapy: Secondary | ICD-10-CM

## 2012-02-03 LAB — CBC WITH DIFFERENTIAL/PLATELET
Eosinophils Absolute: 0 10*3/uL (ref 0.0–0.5)
HCT: 29.4 % — ABNORMAL LOW (ref 34.8–46.6)
LYMPH%: 28.3 % (ref 14.0–49.7)
MCHC: 34.4 g/dL (ref 31.5–36.0)
MCV: 85.2 fL (ref 79.5–101.0)
MONO%: 6.3 % (ref 0.0–14.0)
NEUT#: 1.2 10*3/uL — ABNORMAL LOW (ref 1.5–6.5)
NEUT%: 62.3 % (ref 38.4–76.8)
Platelets: 106 10*3/uL — ABNORMAL LOW (ref 145–400)
RBC: 3.45 10*6/uL — ABNORMAL LOW (ref 3.70–5.45)
nRBC: 0 % (ref 0–0)

## 2012-02-03 LAB — COMPREHENSIVE METABOLIC PANEL (CC13)
ALT: 42 U/L (ref 0–55)
BUN: 39 mg/dL — ABNORMAL HIGH (ref 7.0–26.0)
CO2: 27 mEq/L (ref 22–29)
Creatinine: 1.3 mg/dL — ABNORMAL HIGH (ref 0.6–1.1)
Total Bilirubin: 0.34 mg/dL (ref 0.20–1.20)

## 2012-02-03 MED ORDER — DEXAMETHASONE SODIUM PHOSPHATE 10 MG/ML IJ SOLN
20.0000 mg | Freq: Once | INTRAMUSCULAR | Status: AC
  Administered 2012-02-03: 20 mg via INTRAVENOUS

## 2012-02-03 MED ORDER — DIPHENHYDRAMINE HCL 50 MG/ML IJ SOLN
25.0000 mg | Freq: Once | INTRAMUSCULAR | Status: AC
  Administered 2012-02-03: 25 mg via INTRAVENOUS

## 2012-02-03 MED ORDER — FAMOTIDINE IN NACL 20-0.9 MG/50ML-% IV SOLN
20.0000 mg | Freq: Once | INTRAVENOUS | Status: AC
  Administered 2012-02-03: 20 mg via INTRAVENOUS

## 2012-02-03 MED ORDER — SODIUM CHLORIDE 0.9 % IV SOLN
120.0000 mg | Freq: Once | INTRAVENOUS | Status: AC
  Administered 2012-02-03: 120 mg via INTRAVENOUS
  Filled 2012-02-03: qty 12

## 2012-02-03 MED ORDER — SODIUM CHLORIDE 0.9 % IV SOLN
Freq: Once | INTRAVENOUS | Status: AC
  Administered 2012-02-03 (×2): via INTRAVENOUS

## 2012-02-03 MED ORDER — ONDANSETRON 16 MG/50ML IVPB (CHCC)
16.0000 mg | Freq: Once | INTRAVENOUS | Status: AC
  Administered 2012-02-03: 16 mg via INTRAVENOUS

## 2012-02-03 MED ORDER — PACLITAXEL CHEMO INJECTION 300 MG/50ML
45.0000 mg/m2 | Freq: Once | INTRAVENOUS | Status: AC
  Administered 2012-02-03: 72 mg via INTRAVENOUS
  Filled 2012-02-03: qty 12

## 2012-02-03 NOTE — Progress Notes (Signed)
Ok to treat with ANC 1.2 per Dr. Arbutus Ped.  Pharmacy mixed new bag of Taxol at 36 mg. Verified by Melodie Bouillon, RN. New bag of Taxol ran for 30 minutes.

## 2012-02-03 NOTE — Progress Notes (Signed)
Disability form left with Dr. Asa Lente nurse for signature.

## 2012-02-03 NOTE — Patient Instructions (Signed)
[facility] Discharge Instructions for Patients Receiving Chemotherapy  Today you received the following chemotherapy agents Taxol/Carboplatin To help prevent nausea and vomiting after your treatment, we encourage you to take your nausea medication as prescribed.  If you develop nausea and vomiting that is not controlled by your nausea medication, call the clinic. If it is after clinic hours your family physician or the after hours number for the clinic or go to the Emergency Department.   BELOW ARE SYMPTOMS THAT SHOULD BE REPORTED IMMEDIATELY:  *FEVER GREATER THAN 100.5 F  *CHILLS WITH OR WITHOUT FEVER  NAUSEA AND VOMITING THAT IS NOT CONTROLLED WITH YOUR NAUSEA MEDICATION  *UNUSUAL SHORTNESS OF BREATH  *UNUSUAL BRUISING OR BLEEDING  TENDERNESS IN MOUTH AND THROAT WITH OR WITHOUT PRESENCE OF ULCERS  *URINARY PROBLEMS  *BOWEL PROBLEMS  UNUSUAL RASH Items with * indicate a potential emergency and should be followed up as soon as possible.  One of the nurses will contact you 24 hours after your treatment. Please let the nurse know about any problems that you may have experienced. Feel free to call the clinic you have any questions or concerns. The clinic phone number is [phone].   I have been informed and understand all the instructions given to me. I know to contact the clinic, my physician, or go to the Emergency Department if any problems should occur. I do not have any questions at this time, but understand that I may call the clinic during office hours   should I have any questions or need assistance in obtaining follow up care.    __________________________________________  _____________  __________ Signature of Patient or Authorized Representative            Date                   Time    __________________________________________ Nurse's Signature    

## 2012-02-03 NOTE — Progress Notes (Signed)
1039 Patient complains of "something leaking". Taxol filter was faulty. Chemo disconnected and disposed of adequately. Pharmacy to mix a new bag of chemo.

## 2012-02-04 ENCOUNTER — Ambulatory Visit
Admission: RE | Admit: 2012-02-04 | Discharge: 2012-02-04 | Disposition: A | Discharge status: Home / Self Care | Payer: BC Managed Care – PPO | Source: Ambulatory Visit | Attending: Radiation Oncology | Admitting: Radiation Oncology

## 2012-02-05 ENCOUNTER — Ambulatory Visit
Admission: RE | Admit: 2012-02-05 | Discharge: 2012-02-05 | Disposition: A | Discharge status: Home / Self Care | Payer: BC Managed Care – PPO | Source: Ambulatory Visit | Attending: Radiation Oncology | Admitting: Radiation Oncology

## 2012-02-05 ENCOUNTER — Encounter: Payer: Self-pay | Admitting: Internal Medicine

## 2012-02-05 ENCOUNTER — Encounter: Payer: Self-pay | Admitting: Radiation Oncology

## 2012-02-05 VITALS — BP 148/59 | HR 71 | Temp 97.7°F | Resp 20 | Wt 116.6 lb

## 2012-02-05 DIAGNOSIS — C3490 Malignant neoplasm of unspecified part of unspecified bronchus or lung: Secondary | ICD-10-CM

## 2012-02-05 MED ORDER — METHYLPREDNISOLONE 4 MG PO KIT: PACK | ORAL | Status: DC

## 2012-02-05 NOTE — Progress Notes (Signed)
Faxed disability papers to [phone]. Forward to Medical Records.

## 2012-02-05 NOTE — Progress Notes (Signed)
Patient here weekly rad txs lung:23/33 completed, no c/o sob, n, no diff swallowing, no c/o pain  patient getting Taxol every Monday 02/03/12 wbc=1.9, states eating well,  8:28 AM

## 2012-02-05 NOTE — Progress Notes (Signed)
[facility]    Radiation Oncology [address] [place]     [name], M.D. [place], [place] [address]               [name], M.D., Ph.D. Phone: [phone]      [name], M.D. Fax: [phone]      [name], M.D., Ph.D.         [name], M.D.         [name], M.D Weekly Treatment Management Note  Name: Pamela Levine     MRN: [id number]        CSN: [id number] Date: 02/05/2012      DOB: [birth year]  CC: Pamela Manns, MD         [facility]    Status: Outpatient  Diagnosis: The encounter diagnosis was Lung cancer.  Current Dose: 41.4 Gy  Current Fraction: 22  Planned Dose: 59.4 Gy  Narrative: Pamela Levine was seen today for weekly treatment management. The chart was checked and CBCT  were reviewed. She is tolerating the treatments well at this time. She denies any swallowing difficulties or pain with swallowing. She denies any breathing problems. She has minimal fatigue at this time. She denies any further problems with itching but would like to have a refill on her Medrol Dosepak as this helped significantly for her.  Review of patient's allergies indicates no known allergies.  Current Outpatient Prescriptions  Medication Sig Dispense Refill  . amLODipine (NORVASC) 10 MG tablet Take 1 tablet (10 mg total) by mouth daily.  90 tablet  3  . Artificial Tear Ointment (DRY EYES OP) Place 1 drop into both eyes 3 (three) times daily as needed. For dry eyes      . aspirin 81 MG chewable tablet Chew 81 mg by mouth daily.        . Calcium Carbonate-Vitamin D (CALTRATE 600+D) 600-400 MG-UNIT per tablet Take 2 tablets by mouth daily.       . ferrous sulfate 325 (65 FE) MG tablet Take 325 mg by mouth daily.      . fluticasone (FLONASE) 50 MCG/ACT nasal spray Place 2 sprays into the nose daily.  16 g  6  . methylPREDNISolone (MEDROL, PAK,) 4 MG tablet follow package directions  21 tablet  0  . metoprolol succinate (TOPROL-XL) 50 MG 24 hr  tablet Take 1 tablet (50 mg total) by mouth daily. Take with or immediately following a meal.  90 tablet  3  . Multiple Vitamins-Minerals (CENTRUM SILVER PO) Take 1 tablet by mouth daily.       Marland Kitchen olmesartan-hydrochlorothiazide (BENICAR HCT) 40-25 MG per tablet Take 1 tablet by mouth daily.  90 tablet  3  . prochlorperazine (COMPAZINE) 10 MG tablet Take 10 mg by mouth every 6 (six) hours as needed.      . simvastatin (ZOCOR) 20 MG tablet Take 1 tablet (20 mg total) by mouth daily.  90 tablet  3   Labs:  Lab Results  Component Value Date   WBC 1.9* 02/03/2012   HGB 10.1* 02/03/2012   HCT 29.4* 02/03/2012   MCV 85.2 02/03/2012   PLT 106* 02/03/2012   Lab Results  Component Value Date   CREATININE 1.3* 02/03/2012   BUN 39.0* 02/03/2012   NA 137 02/03/2012   K 4.1 02/03/2012   CL 98 02/03/2012   CO2 27 02/03/2012   Lab Results  Component Value Date   ALT 42 02/03/2012  AST 27 02/03/2012   PHOS 3.4 03/15/2010   BILITOT 0.34 02/03/2012    Physical Examination:  weight is 116 lb 9.6 oz (52.889 kg). Her oral temperature is 97.7 F (36.5 C). Her blood pressure is 148/59 and her pulse is 71. Her respiration is 20 and oxygen saturation is 100%.    Wt Readings from Last 3 Encounters:  02/05/12 116 lb 9.6 oz (52.889 kg)  01/28/12 116 lb 8 oz (52.844 kg)  01/21/12 117 lb 4.8 oz (53.207 kg)    The oral cavity is moist without secondary infection Lungs - Normal respiratory effort, chest expands symmetrically. Lungs are clear to auscultation, no crackles or wheezes.  Heart has regular rhythm and rate  Abdomen is soft and non tender with normal bowel sounds  the skin in the upper chest and right supraclavicular region shows erythema and hyperpigmentation changes but no moist desquamation Assessment:  Patient tolerating treatments well  Plan: Continue treatment per original radiation prescription

## 2012-02-06 ENCOUNTER — Ambulatory Visit
Admission: RE | Admit: 2012-02-06 | Discharge: 2012-02-06 | Disposition: A | Discharge status: Home / Self Care | Payer: BC Managed Care – PPO | Source: Ambulatory Visit | Attending: Radiation Oncology | Admitting: Radiation Oncology

## 2012-02-07 ENCOUNTER — Ambulatory Visit
Admission: RE | Admit: 2012-02-07 | Discharge: 2012-02-07 | Disposition: A | Discharge status: Home / Self Care | Payer: BC Managed Care – PPO | Source: Ambulatory Visit | Attending: Radiation Oncology | Admitting: Radiation Oncology

## 2012-02-10 ENCOUNTER — Other Ambulatory Visit (HOSPITAL_BASED_OUTPATIENT_CLINIC_OR_DEPARTMENT_OTHER): Payer: BC Managed Care – PPO

## 2012-02-10 ENCOUNTER — Ambulatory Visit
Admission: RE | Admit: 2012-02-10 | Discharge: 2012-02-10 | Disposition: A | Discharge status: Home / Self Care | Payer: BC Managed Care – PPO | Source: Ambulatory Visit | Attending: Radiation Oncology | Admitting: Radiation Oncology

## 2012-02-10 ENCOUNTER — Telehealth: Payer: Self-pay | Admitting: Internal Medicine

## 2012-02-10 ENCOUNTER — Ambulatory Visit (HOSPITAL_BASED_OUTPATIENT_CLINIC_OR_DEPARTMENT_OTHER): Payer: BC Managed Care – PPO | Admitting: Physician Assistant

## 2012-02-10 ENCOUNTER — Ambulatory Visit: Payer: BC Managed Care – PPO

## 2012-02-10 ENCOUNTER — Encounter: Payer: Self-pay | Admitting: Physician Assistant

## 2012-02-10 VITALS — BP 146/70 | HR 92 | Temp 98.4°F | Resp 20 | Ht 67.5 in | Wt 114.9 lb

## 2012-02-10 DIAGNOSIS — C3410 Malignant neoplasm of upper lobe, unspecified bronchus or lung: Secondary | ICD-10-CM

## 2012-02-10 DIAGNOSIS — C3490 Malignant neoplasm of unspecified part of unspecified bronchus or lung: Secondary | ICD-10-CM

## 2012-02-10 DIAGNOSIS — D702 Other drug-induced agranulocytosis: Secondary | ICD-10-CM

## 2012-02-10 LAB — CBC WITH DIFFERENTIAL/PLATELET
Basophils Absolute: 0 10*3/uL (ref 0.0–0.1)
Eosinophils Absolute: 0 10*3/uL (ref 0.0–0.5)
HCT: 28.1 % — ABNORMAL LOW (ref 34.8–46.6)
HGB: 9.8 g/dL — ABNORMAL LOW (ref 11.6–15.9)
LYMPH%: 31.1 % (ref 14.0–49.7)
MONO#: 0.3 10*3/uL (ref 0.1–0.9)
NEUT#: 0.4 10*3/uL — CL (ref 1.5–6.5)
NEUT%: 41.6 % (ref 38.4–76.8)
Platelets: 273 10*3/uL (ref 145–400)
WBC: 1.1 10*3/uL — ABNORMAL LOW (ref 3.9–10.3)
lymph#: 0.3 10*3/uL — ABNORMAL LOW (ref 0.9–3.3)

## 2012-02-10 NOTE — Patient Instructions (Addendum)
Follow the neutropenic precautions as you white blood cell count is low right now Complete your course of radiation therapy as scheduled Follow up with Dr. Arbutus Ped in approximately 1 month after completing your radiation therapy with a restaging CT scan of your chest to re-evaluate your disease

## 2012-02-10 NOTE — Telephone Encounter (Signed)
appts made and printed for pt aom °

## 2012-02-11 ENCOUNTER — Ambulatory Visit
Admission: RE | Admit: 2012-02-11 | Discharge: 2012-02-11 | Disposition: A | Discharge status: Home / Self Care | Payer: BC Managed Care – PPO | Source: Ambulatory Visit | Attending: Radiation Oncology | Admitting: Radiation Oncology

## 2012-02-12 ENCOUNTER — Ambulatory Visit
Admission: RE | Admit: 2012-02-12 | Discharge: 2012-02-12 | Disposition: A | Discharge status: Home / Self Care | Payer: BC Managed Care – PPO | Source: Ambulatory Visit | Attending: Radiation Oncology | Admitting: Radiation Oncology

## 2012-02-12 NOTE — Progress Notes (Signed)
[facility] [phone]   [phone]  OFFICE PROGRESS NOTE  [name], MD [address] [place] [address], [place] [address]  DIAGNOSIS: Stage IIIA (T1a, N2, M0) non-small cell lung cancer, squamous cell carcinoma diagnosed in October of 2013.  PRIOR THERAPY: None  CURRENT THERAPY: Concurrent chemoradiation with weekly carboplatin for AUC of 2 and paclitaxel 45 mg/M2  INTERVAL HISTORY: Pamela Levine 67 y.o. female returns to the clinic today for routine followup visit accompanied by a friend. Overall she continues to tolerate her course of concurrent chemoradiation without significant difficulty. She has had some mild fatigue.  She denied having any significant fever or chills. She denied having any nausea or vomiting. She has no significant chest pain, shortness breath, cough or hemoptysis. She has no weight loss or night sweats.  MEDICAL HISTORY: Past Medical History  Diagnosis Date  . Hyperlipidemia   . Hypertension   . History  of basal cell carcinoma   . Light cigarette smoker   . Kidney stone 01/2000    stent. Renal U/S normal in 12/01.  Marland Kitchen Heart murmur   . Arthritis   . Osteoarthritis   . Cancer     skin  . Lung mass October 2013    RUL  . COPD (chronic obstructive pulmonary disease)   . Ascending aortic aneurysm   . Lung cancer 12/19/2011    ALLERGIES:   has no known allergies.  MEDICATIONS:  Current Outpatient Prescriptions  Medication Sig Dispense Refill  . amLODipine (NORVASC) 10 MG tablet Take 1 tablet (10 mg total) by mouth daily.  90 tablet  3  . Artificial Tear Ointment (DRY EYES OP) Place 1 drop into both eyes 3 (three) times daily as needed. For dry eyes      . aspirin 81 MG chewable tablet Chew 81 mg by mouth daily.        . Calcium Carbonate-Vitamin D (CALTRATE 600+D) 600-400 MG-UNIT per tablet Take 2 tablets by mouth daily.       . ferrous sulfate 325 (65 FE) MG tablet Take 325 mg by mouth  daily.      . fluticasone (FLONASE) 50 MCG/ACT nasal spray Place 2 sprays into the nose daily.  16 g  6  . methylPREDNISolone (MEDROL, PAK,) 4 MG tablet follow package directions  21 tablet  0  . metoprolol succinate (TOPROL-XL) 50 MG 24 hr tablet Take 1 tablet (50 mg total) by mouth daily. Take with or immediately following a meal.  90 tablet  3  . Multiple Vitamins-Minerals (CENTRUM SILVER PO) Take 1 tablet by mouth daily.       Marland Kitchen olmesartan-hydrochlorothiazide (BENICAR HCT) 40-25 MG per tablet Take 1 tablet by mouth daily.  90 tablet  3  . prochlorperazine (COMPAZINE) 10 MG tablet Take 10 mg by mouth every 6 (six) hours as needed.      . simvastatin (ZOCOR) 20 MG tablet Take 1 tablet (20 mg total) by mouth daily.  90 tablet  3    SURGICAL HISTORY:  Past Surgical History  Procedure Date  . Partial hysterectomy 1978    bleeding  . Kidney stone surgery 01/2000    stent  . Colonoscopy   . Video bronchoscopy with endobronchial ultrasound 11/27/2011    Procedure: VIDEO BRONCHOSCOPY WITH ENDOBRONCHIAL ULTRASOUND;  Surgeon: Leslye Peer, MD;  Location: [facility] OR;  Service: Pulmonary;  Laterality: N/A;    REVIEW OF SYSTEMS:  A comprehensive  review of systems was negative except for: Constitutional: positive for fatigue   PHYSICAL EXAMINATION: General appearance: alert, cooperative and no distress Head: Normocephalic, without obvious abnormality, atraumatic Neck: no adenopathy Lymph nodes: Cervical, supraclavicular, and axillary nodes normal. Resp: clear to auscultation bilaterally Cardio: regular rate and rhythm, S1, S2 normal, no murmur, click, rub or gallop GI: soft, non-tender; bowel sounds normal; no masses,  no organomegaly Extremities: extremities normal, atraumatic, no cyanosis or edema Neurologic: Grossly normal  ECOG PERFORMANCE STATUS: 1 - Symptomatic but completely ambulatory  Blood pressure 146/70, pulse 92, temperature 98.4 F (36.9 C), temperature source Oral, resp. rate 20,  height 5' 7.5" (1.715 m), weight 114 lb 14.4 oz (52.118 kg), last menstrual period 01/29/1976.  LABORATORY DATA: Lab Results  Component Value Date   WBC 1.1* 02/10/2012   HGB 9.8* 02/10/2012   HCT 28.1* 02/10/2012   MCV 85.7 02/10/2012   PLT 273 02/10/2012      Chemistry      Component Value Date/Time   NA 137 02/03/2012 0852   NA 141 11/26/2011 1430   K 4.1 02/03/2012 0852   K 3.4* 11/26/2011 1430   CL 98 02/03/2012 0852   CL 101 11/26/2011 1430   CO2 27 02/03/2012 0852   CO2 30 11/26/2011 1430   BUN 39.0* 02/03/2012 0852   BUN 29* 11/26/2011 1430   CREATININE 1.3* 02/03/2012 0852   CREATININE 1.27* 11/26/2011 1430      Component Value Date/Time   CALCIUM 10.5* 02/03/2012 0852   CALCIUM 10.0 11/26/2011 1430   ALKPHOS 90 02/03/2012 0852   ALKPHOS 100 11/26/2011 1430   AST 27 02/03/2012 0852   AST 25 11/26/2011 1430   ALT 42 02/03/2012 0852   ALT 17 11/26/2011 1430   BILITOT 0.34 02/03/2012 0852   BILITOT 0.1* 11/26/2011 1430       RADIOGRAPHIC STUDIES: Dg Chest 1 View  12/10/2011  *RADIOLOGY REPORT*  Clinical Data: Post biopsy  CHEST - 1 VIEW  Comparison: 11/27/2011; 11/05/2011; chest CT - 10/23/2011; PET CT - 11/14/2011; CT guided right upper lobe lung mass biopsy - earlier same day  Findings:  Grossly unchanged enlarged cardiac silhouette and mediastinal contours with atherosclerotic calcifications within the aortic arch.  There is grossly unchanged asymmetric right apical pleuroparenchymal thickening.  No definite pneumothorax post right upper lobe mass biopsy.  Grossly unchanged pleural parenchymal thickening of the left lung apex.  The lungs again appear hyperinflated with mild diffuse thickening of the pulmonary interstitium.  Linear heterogeneous opacities in the right mid lung are grossly unchanged favored to represent atelectasis or scar.  No new focal airspace opacities.  No definite pleural effusion, though note, the right costophrenic angle is excluded from view.  IMPRESSION:  1. No  definite pneumothorax post right upper lobe biopsy. 2. Hyperexpanded lungs and bronchitic change without definite acute cardiopulmonary disease.   Original Report Authenticated By: Tacey Ruiz, MD    Mr Brain W Wo Contrast  12/29/2011  *RADIOLOGY REPORT*  Clinical Data: New diagnosis lung cancer.  Staging.  MRI HEAD WITHOUT AND WITH CONTRAST  Technique:  Multiplanar, multiecho pulse sequences of the brain and surrounding structures were obtained according to standard protocol without and with intravenous contrast  Contrast: 10mL MULTIHANCE GADOBENATE DIMEGLUMINE 529 MG/ML IV SOLN  Comparison: None.  Findings: Ventricle size is normal.  Negative for acute infarct. Minimal chronic ischemic changes are present.  No mass or edema is present.  Negative for intracranial hemorrhage or fluid collection. No shift of  the midline structures.  Postcontrast imaging of the brain reveals normal enhancement.  No enhancing mass or abnormal leptomeningeal enhancement is identified.  IMPRESSION: Negative for metastatic disease.  No acute intracranial abnormality.   Original Report Authenticated By: Janeece Riggers, M.D.    Ct Biopsy  12/10/2011  *RADIOLOGY REPORT*  Clinical Data/Indication: RIGHT UPPER LOBE MASS  CT-GUIDED BIOPSY of a right upper lobe mass.  Core.  Sedation: Versed 2.0 mg, Fentanyl 100 mcg.  Total Moderate Sedation Time: 19 minutes.  Procedure: The procedure, risks, benefits, and alternatives were explained to the patient. Questions regarding the procedure were encouraged and answered. The patient understands and consents to the procedure.  The right posterior upper thorax was prepped with betadine in a sterile fashion, and a sterile drape was applied covering the operative field. A mask and sterile gloves were used for the procedure.  Under CT guidance, the 17 gauge needle was inserted into the right upper lobe mass.  Three 18 gauge cores were obtained and placed in formalin for surgical pathology.  A fourth core  was obtained and placed in saline for tissue culture.  The guide needle was removed. Final imaging was performed.  Patient tolerated the procedure well without complication.  Vital sign monitoring by nursing staff during the procedure will continue as patient is in the special procedures unit for post procedure observation.  Findings: The images document guide needle placement within the right upper lobe mass.  Post biopsy images demonstrate no pneumothorax.  IMPRESSION: Successful CT-guided core biopsy of a right upper lobe mass or surgical pathology and tissue culture.   Original Report Authenticated By: Jolaine Click, M.D.     ASSESSMENT/PLAN: This is a very pleasant 67 years old white female with history of stage IIIa non-small cell lung cancer, squamous cell carcinoma currently undergoing concurrent chemoradiation with weekly carboplatin and paclitaxel.  The patient is doing fine and tolerating her chemotherapy fairly well. The patient was discussed Dr. Arbutus Ped. Her ANC is sub-optimal at 0.4 today and will not receive chemotherapy today as a result. She will complete the remainder of her radiation therapy without further concurrent chemotherapy. She will follow up with Dr. Arbutus Ped in approximately one month after completing her radiation therapy with a restaging CT scan of her chest to re-evaluate her disease. She states he leave from work may need to be extended and we will help her with the necessary paperwork.   Laural Benes, Athalene Kolle E, PA-C   All questions were answered. The patient knows to call the clinic with any problems, questions or concerns. We can certainly see the patient much sooner if necessary.  I spent 20 minutes counseling the patient face to face. The total time spent in the appointment was 30 minutes.

## 2012-02-13 ENCOUNTER — Encounter: Payer: Self-pay | Admitting: Radiation Oncology

## 2012-02-13 ENCOUNTER — Ambulatory Visit
Admission: RE | Admit: 2012-02-13 | Discharge: 2012-02-13 | Disposition: A | Discharge status: Home / Self Care | Payer: BC Managed Care – PPO | Source: Ambulatory Visit | Attending: Radiation Oncology | Admitting: Radiation Oncology

## 2012-02-13 VITALS — BP 149/49 | HR 69 | Temp 98.0°F | Resp 16 | Wt 115.9 lb

## 2012-02-13 DIAGNOSIS — C3490 Malignant neoplasm of unspecified part of unspecified bronchus or lung: Secondary | ICD-10-CM

## 2012-02-13 NOTE — Progress Notes (Signed)
Patient presents to the clinic today accompanied by her mother for a PUT with Dr. Roselind Messier. Patient alert and oriented to person, place, and time. No distress noted. Steady gait noted. Pleasant affect noted. Patient denies pain at this time. Patient reports an occasional productive cough has been noted with small amounts of clear sputum. Patient denies SOB. Patient denies painful or difficulty with swallowing. Patient reports a good appetite and sleeping without difficulty. Patient reports hyperpigmentation of right upper chest and back for which she applies biafine bid as directed. Patient reports itching of the treatment field has resolved. Reported all findings to Dr. Roselind Messier.

## 2012-02-13 NOTE — Progress Notes (Signed)
[facility]    Radiation Oncology [address] [place]     [name], M.D. [place], [place] [address]               [name], M.D., Ph.D. Phone: [phone]      [name], M.D. Fax: [phone]      [name], M.D., Ph.D.         [name], M.D.         [name], M.D Weekly Treatment Management Note  Name: Pamela Levine     MRN: [id number]        CSN: [id number] Date: 02/13/2012      DOB: 1946-01-24  CC: [name], MD         [facility]    Status: Outpatient  Diagnosis: The encounter diagnosis was Lung cancer.  Current Dose: 52.2 Gy  Current Fraction: 29  Planned Dose: 59.4 Gy  Narrative: Pamela Levine was seen today for weekly treatment management. The chart was checked and CBCT  were reviewed. She continues to tolerate the treatments well at this time. She denies any significant  swallowing problems. She denies any breathing problems.  Her chemotherapy was held this week.  She has had a mild cough and I've asked that she let us know if this becomes more of an issue.  Review of patient's allergies indicates no known allergies.  Current Outpatient Prescriptions  Medication Sig Dispense Refill  . amLODipine (NORVASC) 10 MG tablet Take 1 tablet (10 mg total) by mouth daily.  90 tablet  3  . Artificial Tear Ointment (DRY EYES OP) Place 1 drop into both eyes 3 (three) times daily as needed. For dry eyes      . aspirin 81 MG chewable tablet Chew 81 mg by mouth daily.        . Calcium Carbonate-Vitamin D (CALTRATE 600+D) 600-400 MG-UNIT per tablet Take 2 tablets by mouth daily.       . ferrous sulfate 325 (65 FE) MG tablet Take 325 mg by mouth daily.      . fluticasone (FLONASE) 50 MCG/ACT nasal spray Place 2 sprays into the nose daily.  16 g  6  . methylPREDNISolone (MEDROL, PAK,) 4 MG tablet follow package directions  21 tablet  0  . metoprolol succinate (TOPROL-XL) 50 MG 24 hr tablet Take 1 tablet (50 mg total) by mouth  daily. Take with or immediately following a meal.  90 tablet  3  . Multiple Vitamins-Minerals (CENTRUM SILVER PO) Take 1 tablet by mouth daily.       Marland Kitchen olmesartan-hydrochlorothiazide (BENICAR HCT) 40-25 MG per tablet Take 1 tablet by mouth daily.  90 tablet  3  . prochlorperazine (COMPAZINE) 10 MG tablet Take 10 mg by mouth every 6 (six) hours as needed.      . simvastatin (ZOCOR) 20 MG tablet Take 1 tablet (20 mg total) by mouth daily.  90 tablet  3   Labs:  Lab Results  Component Value Date   WBC 1.1* 02/10/2012   HGB 9.8* 02/10/2012   HCT 28.1* 02/10/2012   MCV 85.7 02/10/2012   PLT 273 02/10/2012   Lab Results  Component Value Date   CREATININE 1.3* 02/03/2012   BUN 39.0* 02/03/2012   NA 137 02/03/2012   K 4.1 02/03/2012   CL 98 02/03/2012   CO2 27 02/03/2012   Lab Results  Component Value Date   ALT 42 02/03/2012   AST 27  02/03/2012   PHOS 3.4 03/15/2010   BILITOT 0.34 02/03/2012    Physical Examination:  weight is 115 lb 14.4 oz (52.572 kg). Her oral temperature is 98 F (36.7 C). Her blood pressure is 149/49 and her pulse is 69. Her respiration is 16 and oxygen saturation is 100%.    Wt Readings from Last 3 Encounters:  02/13/12 115 lb 14.4 oz (52.572 kg)  02/10/12 114 lb 14.4 oz (52.118 kg)  02/05/12 116 lb 9.6 oz (52.889 kg)    The oral cavity is moist without secondary infection. Lungs - Normal respiratory effort, chest expands symmetrically. Lungs are clear to auscultation, no crackles or wheezes.  Heart has regular rhythm and rate  Abdomen is soft and non tender with normal bowel sounds  Assessment:  Patient tolerating treatments well  Plan: Continue treatment per original radiation prescription

## 2012-02-14 ENCOUNTER — Telehealth: Payer: Self-pay | Admitting: Family Medicine

## 2012-02-14 ENCOUNTER — Ambulatory Visit: Payer: BC Managed Care – PPO

## 2012-02-14 ENCOUNTER — Ambulatory Visit
Admission: RE | Admit: 2012-02-14 | Discharge: 2012-02-14 | Disposition: A | Discharge status: Home / Self Care | Payer: BC Managed Care – PPO | Source: Ambulatory Visit | Attending: Radiation Oncology | Admitting: Radiation Oncology

## 2012-02-14 MED ORDER — METOPROLOL SUCCINATE ER 50 MG PO TB24: 50.0000 mg | ORAL_TABLET | Freq: Every day | ORAL | Status: DC

## 2012-02-14 MED ORDER — OLMESARTAN MEDOXOMIL-HCTZ 40-25 MG PO TABS: 1.0000 | ORAL_TABLET | Freq: Every day | ORAL | Status: DC

## 2012-02-14 MED ORDER — SIMVASTATIN 20 MG PO TABS: 20.0000 mg | ORAL_TABLET | Freq: Every day | ORAL | Status: DC

## 2012-02-14 MED ORDER — AMLODIPINE BESYLATE 10 MG PO TABS: 10.0000 mg | ORAL_TABLET | Freq: Every day | ORAL | Status: DC

## 2012-02-14 NOTE — Telephone Encounter (Signed)
I selected the meds to be ordered  Please find out what mail order and send elect 90 d with 3 ref for each thanks

## 2012-02-14 NOTE — Telephone Encounter (Signed)
Pt said she uses [facility] mail order pharm., Rx sent electronically

## 2012-02-14 NOTE — Telephone Encounter (Signed)
Patient needs a RX faxed to her mail order pharmacy for her blood pressure and cholesterol medications.  Fax [phone]

## 2012-02-14 NOTE — Telephone Encounter (Signed)
Patient needs a refill rx faxed to her pharmacy for her blood pressure and cholesterol medications.     Fax #     Phone #

## 2012-02-15 ENCOUNTER — Ambulatory Visit: Payer: BC Managed Care – PPO

## 2012-02-17 ENCOUNTER — Ambulatory Visit: Payer: BC Managed Care – PPO

## 2012-02-17 ENCOUNTER — Ambulatory Visit
Admission: RE | Admit: 2012-02-17 | Discharge: 2012-02-17 | Disposition: A | Discharge status: Home / Self Care | Payer: BC Managed Care – PPO | Source: Ambulatory Visit | Attending: Radiation Oncology | Admitting: Radiation Oncology

## 2012-02-17 ENCOUNTER — Other Ambulatory Visit (HOSPITAL_BASED_OUTPATIENT_CLINIC_OR_DEPARTMENT_OTHER): Payer: BC Managed Care – PPO | Admitting: Lab

## 2012-02-17 DIAGNOSIS — C3490 Malignant neoplasm of unspecified part of unspecified bronchus or lung: Secondary | ICD-10-CM

## 2012-02-17 DIAGNOSIS — C3410 Malignant neoplasm of upper lobe, unspecified bronchus or lung: Secondary | ICD-10-CM

## 2012-02-17 LAB — CBC WITH DIFFERENTIAL/PLATELET
Basophils Absolute: 0 10*3/uL (ref 0.0–0.1)
EOS%: 0.2 % (ref 0.0–7.0)
Eosinophils Absolute: 0 10*3/uL (ref 0.0–0.5)
HCT: 27.8 % — ABNORMAL LOW (ref 34.8–46.6)
HGB: 9.7 g/dL — ABNORMAL LOW (ref 11.6–15.9)
MCH: 29.9 pg (ref 25.1–34.0)
MCV: 85.8 fL (ref 79.5–101.0)
MONO%: 16.4 % — ABNORMAL HIGH (ref 0.0–14.0)
NEUT#: 3.4 10*3/uL (ref 1.5–6.5)
NEUT%: 76.7 % (ref 38.4–76.8)

## 2012-02-17 LAB — COMPREHENSIVE METABOLIC PANEL (CC13)
Albumin: 3.1 g/dL — ABNORMAL LOW (ref 3.5–5.0)
Alkaline Phosphatase: 103 U/L (ref 40–150)
BUN: 28 mg/dL — ABNORMAL HIGH (ref 7.0–26.0)
CO2: 29 mEq/L (ref 22–29)
Glucose: 103 mg/dl — ABNORMAL HIGH (ref 70–99)
Potassium: 4.9 mEq/L (ref 3.5–5.1)
Sodium: 132 mEq/L — ABNORMAL LOW (ref 136–145)
Total Bilirubin: 0.24 mg/dL (ref 0.20–1.20)
Total Protein: 7.7 g/dL (ref 6.4–8.3)

## 2012-02-17 LAB — TECHNOLOGIST REVIEW

## 2012-02-18 ENCOUNTER — Ambulatory Visit: Payer: BC Managed Care – PPO

## 2012-02-18 ENCOUNTER — Encounter: Payer: Self-pay | Admitting: Radiation Oncology

## 2012-02-18 ENCOUNTER — Ambulatory Visit
Admission: RE | Admit: 2012-02-18 | Discharge: 2012-02-18 | Disposition: A | Discharge status: Home / Self Care | Payer: BC Managed Care – PPO | Source: Ambulatory Visit | Attending: Radiation Oncology | Admitting: Radiation Oncology

## 2012-02-18 VITALS — BP 139/61 | HR 61 | Temp 98.2°F | Resp 20 | Wt 114.3 lb

## 2012-02-18 DIAGNOSIS — C3490 Malignant neoplasm of unspecified part of unspecified bronchus or lung: Secondary | ICD-10-CM

## 2012-02-18 NOTE — Progress Notes (Signed)
Patient here weekly rad txs  Right lung, 30/33 completed, patient alert,oriented x3, no c/o pain, no difficulty swallowing, eating okay and drinking fluids stated, coughs up very little clear sputum, taking cough syrup helping also, no c/o pain, no c/o spb, 10% room air 8:35 AM

## 2012-02-18 NOTE — Progress Notes (Signed)
[facility]    Radiation Oncology [address] [place]     Pamela Levine, M.D. [place], [place] [address]               Pamela Levine, M.D., Ph.D. Phone: [phone]      Pamela Levine, M.D. Fax: [phone]      Pamela Levine, M.D., Ph.D.         Pamela Levine, M.D.         Pamela Levine, M.D Weekly Treatment Management Note  Name: Pamela Levine     MRN: [id number]        CSN: [id number] Date: 02/18/2012      DOB: 1945-04-14  CC: Pamela Manns, MD         [facility]    Status: Outpatient  Diagnosis: The encounter diagnosis was Lung cancer.  Current Dose: 57.6 Gy  Current Fraction: 32/35  Planned Dose: 63 Gy  Narrative: Pamela Levine was seen today for weekly treatment management. The chart was checked and CBCT  were reviewed. She is having some fatigue at this time but is able to carry on  most of her usual activities. She denies any swallowing difficulties or pain with swallowing.  she does have some itching in the radiation portals.  Review of patient's allergies indicates no known allergies.  Current Outpatient Prescriptions  Medication Sig Dispense Refill  . amLODipine (NORVASC) 10 MG tablet Take 1 tablet (10 mg total) by mouth daily.  90 tablet  3  . Artificial Tear Ointment (DRY EYES OP) Place 1 drop into both eyes 3 (three) times daily as needed. For dry eyes      . aspirin 81 MG chewable tablet Chew 81 mg by mouth daily.        . Calcium Carbonate-Vitamin D (CALTRATE 600+D) 600-400 MG-UNIT per tablet Take 2 tablets by mouth daily.       . ferrous sulfate 325 (65 FE) MG tablet Take 325 mg by mouth daily.      . fluticasone (FLONASE) 50 MCG/ACT nasal spray Place 2 sprays into the nose daily.  16 g  6  . methylPREDNISolone (MEDROL, PAK,) 4 MG tablet follow package directions  21 tablet  0  . metoprolol succinate (TOPROL-XL) 50 MG 24 hr tablet Take 1 tablet (50 mg total) by mouth daily. Take with or immediately following a meal.  90 tablet  3  .  Multiple Vitamins-Minerals (CENTRUM SILVER PO) Take 1 tablet by mouth daily.       Marland Kitchen olmesartan-hydrochlorothiazide (BENICAR HCT) 40-25 MG per tablet Take 1 tablet by mouth daily.  90 tablet  3  . prochlorperazine (COMPAZINE) 10 MG tablet Take 10 mg by mouth every 6 (six) hours as needed.      . simvastatin (ZOCOR) 20 MG tablet Take 1 tablet (20 mg total) by mouth daily.  90 tablet  3   Labs:  Lab Results  Component Value Date   WBC 4.4 02/17/2012   HGB 9.7* 02/17/2012   HCT 27.8* 02/17/2012   MCV 85.8 02/17/2012   PLT 261 02/17/2012   Lab Results  Component Value Date   CREATININE 1.2* 02/17/2012   BUN 28.0* 02/17/2012   NA 132* 02/17/2012   K 4.9 02/17/2012   CL 94* 02/17/2012   CO2 29 02/17/2012   Lab Results  Component Value Date   ALT 25 02/17/2012   AST 19 02/17/2012   PHOS 3.4 03/15/2010   BILITOT 0.24 02/17/2012  Physical Examination:  weight is 114 lb 4.8 oz (51.846 kg). Her oral temperature is 98.2 F (36.8 C). Her blood pressure is 139/61 and her pulse is 61. Her respiration is 20 and oxygen saturation is 100%.    Wt Readings from Last 3 Encounters:  02/18/12 114 lb 4.8 oz (51.846 kg)  02/13/12 115 lb 14.4 oz (52.572 kg)  02/10/12 114 lb 14.4 oz (52.118 kg)    The skin of the right upper back and anterior chest area shows erythema and dry desquamation but no moist desquamation. Lungs - Normal respiratory effort, chest expands symmetrically. Lungs are clear to auscultation, no crackles or wheezes.  Heart has regular rhythm and rate  Abdomen is soft and non tender with normal bowel sounds  Assessment:  Patient tolerating treatments well except for issues as above. I've encouraged the patient to increase her fluid intake in light of her BUN and creatinine  Plan: Continue treatment per original radiation prescription

## 2012-02-19 ENCOUNTER — Encounter: Payer: Self-pay | Admitting: Internal Medicine

## 2012-02-19 ENCOUNTER — Ambulatory Visit: Payer: BC Managed Care – PPO

## 2012-02-19 ENCOUNTER — Ambulatory Visit
Admission: RE | Admit: 2012-02-19 | Discharge: 2012-02-19 | Disposition: A | Discharge status: Home / Self Care | Payer: BC Managed Care – PPO | Source: Ambulatory Visit | Attending: Radiation Oncology | Admitting: Radiation Oncology

## 2012-02-19 NOTE — Progress Notes (Signed)
Faxed disability form to [place] [phone]. Forward to Medical Records.

## 2012-02-20 ENCOUNTER — Ambulatory Visit: Payer: BC Managed Care – PPO

## 2012-02-20 ENCOUNTER — Ambulatory Visit
Admission: RE | Admit: 2012-02-20 | Discharge: 2012-02-20 | Disposition: A | Discharge status: Home / Self Care | Payer: BC Managed Care – PPO | Source: Ambulatory Visit | Attending: Radiation Oncology | Admitting: Radiation Oncology

## 2012-02-21 ENCOUNTER — Ambulatory Visit
Admission: RE | Admit: 2012-02-21 | Discharge: 2012-02-21 | Disposition: A | Discharge status: Home / Self Care | Payer: BC Managed Care – PPO | Source: Ambulatory Visit | Attending: Radiation Oncology | Admitting: Radiation Oncology

## 2012-02-21 ENCOUNTER — Ambulatory Visit: Payer: BC Managed Care – PPO

## 2012-02-21 VITALS — Wt 117.7 lb

## 2012-02-21 DIAGNOSIS — C3490 Malignant neoplasm of unspecified part of unspecified bronchus or lung: Secondary | ICD-10-CM

## 2012-02-21 MED ORDER — BIAFINE EX EMUL
Freq: Every day | CUTANEOUS | Status: DC
  Administered 2012-02-21: 09:00:00 via TOPICAL

## 2012-02-21 NOTE — Progress Notes (Signed)
Patient and her mother presented to the clinic today following final treatment. Patient alert and oriented to person, place, and time. No distress noted. Steady gait noted. Pleasant affect noted. Patient denies pain at this time. Hyperpigmentation of right upper back without desquamation noted. Encouraged patient to continue to use biafine twice per day on her upper for the next two weeks. Provided patient with a tube of biafine to sustain her for the next two weeks. Provided patient with upcoming appointments/calendar. Encouraged patient to call with needs. All questions answered. Patient verbalized understanding.

## 2012-02-24 ENCOUNTER — Ambulatory Visit: Payer: BC Managed Care – PPO

## 2012-02-24 ENCOUNTER — Other Ambulatory Visit: Payer: BC Managed Care – PPO | Admitting: Lab

## 2012-02-25 ENCOUNTER — Ambulatory Visit: Payer: BC Managed Care – PPO

## 2012-02-26 ENCOUNTER — Ambulatory Visit: Payer: BC Managed Care – PPO

## 2012-02-27 ENCOUNTER — Ambulatory Visit: Payer: BC Managed Care – PPO

## 2012-02-27 ENCOUNTER — Telehealth: Payer: Self-pay | Admitting: Family Medicine

## 2012-02-27 MED ORDER — BENZONATATE 200 MG PO CAPS: 200.0000 mg | ORAL_CAPSULE | Freq: Three times a day (TID) | ORAL | Status: DC | PRN

## 2012-02-27 NOTE — Telephone Encounter (Signed)
Patient Information:  Caller Name: [name]  Phone: [phone]  Patient: Pamela Levine, Pamela Levine  Gender: Female  DOB: Feb 04, 1945  Age: 67 Years  PCP: [name] [facility])  Office Follow Up:  Does the office need to follow up with this patient?: Yes  Instructions For The Office: OFFICE PLEASE FOLLOW UP WITH PT IF DR TOWER WOULD PRESCRIBE SOMETHING FOR HER COUGH   Symptoms  Reason For Call & Symptoms: pt reports that she has just completed radiation therapy on 02/21/12.  Pt states she has been having a dry cough.  Pt spoke with Dr Doylene Canard who states it is normal.  Dr Doylene Canard advised Robitussin DM  Reviewed Health History In EMR: Yes  Reviewed Medications In EMR: Yes  Reviewed Allergies In EMR: Yes  Reviewed Surgeries / Procedures: Yes  Date of Onset of Symptoms: 02/17/2012  Treatments Tried: Robitussin DM  Treatments Tried Worked: No  Guideline(s) Used:  Cough  Disposition Per Guideline:   See Within 3 Days in Office  Reason For Disposition Reached:   Cough has been present for > 10 days  Advice Given:  OTC Cough Syrup - Dextromethorphan:  Cough syrups containing the cough suppressant dextromethorphan (DM) may help decrease your cough. Cough syrups work best for coughs that keep you awake at night. They can also sometimes help in the late stages of a respiratory infection when the cough is dry and hacking. They can be used along with cough drops.  Patient Refused Recommendation:  Patient Requests Prescription  Pt wants to know if Dr Milinda Antis could prescribe something for her dry cough.  Pt refused appt.

## 2012-02-27 NOTE — Telephone Encounter (Signed)
Will try tessalon , if this and the DM cough med otc do not help, let me know

## 2012-02-28 NOTE — Telephone Encounter (Signed)
Pt notified, an Rx called in as prescribed

## 2012-03-01 ENCOUNTER — Encounter: Payer: Self-pay | Admitting: Radiation Oncology

## 2012-03-01 NOTE — Progress Notes (Signed)
  Radiation Oncology         [phone] ________________________________  Name: Pamela Levine MRN: [id number]  Date: 03/01/2012  DOB: 03/29/1945  End of Treatment Note  Diagnosis:   Stage III non-small cell lung cancer     Indication for treatment:  Definitive treatment along with radiosensitizing chemotherapy       Radiation treatment dates:   01/01/2012 through 02/21/2012  Site/dose:   Right upper chest region 63 gray in 35 fractions  Beams/energy:   3-D conformal therapy using 5 beams  Narrative: The patient tolerated radiation treatment relatively well.   She experienced some fatigue as well as some mild esophageal symptoms.   Plan: The patient has completed radiation treatment. The patient will return to radiation oncology clinic for routine followup in one month. I advised them to call or return sooner if they have any questions or concerns related to their recovery or treatment.  -----------------------------------  [name], PhD, MD

## 2012-03-02 ENCOUNTER — Other Ambulatory Visit: Payer: BC Managed Care – PPO | Admitting: Lab

## 2012-03-10 ENCOUNTER — Telehealth: Payer: Self-pay | Admitting: Family Medicine

## 2012-03-10 NOTE — Telephone Encounter (Signed)
Please print out px and I will sign to send or send electonically if able thanks

## 2012-03-10 NOTE — Telephone Encounter (Signed)
Patient Information:  Caller Name: Ellaina  Phone: [phone]  Patient: Abrish, Erny  Gender: Female  DOB: Jun 15, 1945  Age: 67 Years  PCP: [name] [facility])  Office Follow Up:  Does the office need to follow up with this patient?: Yes  Instructions For The Office: Please fax RX's to mail order company   Symptoms  Reason For Call & Symptoms: Calling about needing all precription for Medications faxed to [employer] fax #[phone].  Rx's need to be 90 days supply. This is new fax # and should work this time.  Reviewed Health History In EMR: Yes  Reviewed Medications In EMR: Yes  Reviewed Allergies In EMR: Yes  Reviewed Surgeries / Procedures: Yes  Date of Onset of Symptoms: 03/10/2012  Guideline(s) Used:  No Protocol Available - Information Only  Disposition Per Guideline:   Home Care  Reason For Disposition Reached:   Information only question and nurse able to answer  Advice Given:  Call Back If:  New symptoms develop

## 2012-03-11 MED ORDER — OLMESARTAN MEDOXOMIL-HCTZ 40-25 MG PO TABS: 1.0000 | ORAL_TABLET | Freq: Every day | ORAL | Status: DC

## 2012-03-11 MED ORDER — METOPROLOL SUCCINATE ER 50 MG PO TB24: 50.0000 mg | ORAL_TABLET | Freq: Every day | ORAL | Status: DC

## 2012-03-11 MED ORDER — AMLODIPINE BESYLATE 10 MG PO TABS: 10.0000 mg | ORAL_TABLET | Freq: Every day | ORAL | Status: DC

## 2012-03-11 MED ORDER — SIMVASTATIN 20 MG PO TABS: 20.0000 mg | ORAL_TABLET | Freq: Every day | ORAL | Status: DC

## 2012-03-11 NOTE — Telephone Encounter (Signed)
Rx sent electronically.  

## 2012-03-20 ENCOUNTER — Ambulatory Visit (HOSPITAL_COMMUNITY)
Admission: RE | Admit: 2012-03-20 | Discharge: 2012-03-20 | Disposition: A | Discharge status: Home / Self Care | Payer: BC Managed Care – PPO | Source: Ambulatory Visit | Attending: Physician Assistant | Admitting: Physician Assistant

## 2012-03-20 DIAGNOSIS — R222 Localized swelling, mass and lump, trunk: Secondary | ICD-10-CM | POA: Insufficient documentation

## 2012-03-20 DIAGNOSIS — I700 Atherosclerosis of aorta: Secondary | ICD-10-CM | POA: Insufficient documentation

## 2012-03-20 DIAGNOSIS — I712 Thoracic aortic aneurysm, without rupture, unspecified: Secondary | ICD-10-CM | POA: Insufficient documentation

## 2012-03-20 DIAGNOSIS — C3490 Malignant neoplasm of unspecified part of unspecified bronchus or lung: Secondary | ICD-10-CM | POA: Insufficient documentation

## 2012-03-20 MED ORDER — IOHEXOL 300 MG/ML  SOLN
80.0000 mL | Freq: Once | INTRAMUSCULAR | Status: AC | PRN
  Administered 2012-03-20: 80 mL via INTRAVENOUS

## 2012-03-23 ENCOUNTER — Ambulatory Visit (HOSPITAL_BASED_OUTPATIENT_CLINIC_OR_DEPARTMENT_OTHER): Payer: BC Managed Care – PPO | Admitting: Internal Medicine

## 2012-03-23 ENCOUNTER — Encounter: Payer: Self-pay | Admitting: Internal Medicine

## 2012-03-23 ENCOUNTER — Telehealth: Payer: Self-pay | Admitting: *Deleted

## 2012-03-23 ENCOUNTER — Encounter: Payer: Self-pay | Admitting: Radiation Oncology

## 2012-03-23 ENCOUNTER — Telehealth: Payer: Self-pay | Admitting: Radiation Oncology

## 2012-03-23 ENCOUNTER — Ambulatory Visit
Admission: RE | Admit: 2012-03-23 | Discharge: 2012-03-23 | Disposition: A | Discharge status: Home / Self Care | Payer: BC Managed Care – PPO | Source: Ambulatory Visit | Attending: Radiation Oncology | Admitting: Radiation Oncology

## 2012-03-23 ENCOUNTER — Telehealth: Payer: Self-pay | Admitting: Internal Medicine

## 2012-03-23 ENCOUNTER — Other Ambulatory Visit (HOSPITAL_BASED_OUTPATIENT_CLINIC_OR_DEPARTMENT_OTHER): Payer: BC Managed Care – PPO

## 2012-03-23 LAB — CBC WITH DIFFERENTIAL/PLATELET
BASO%: 0.6 % (ref 0.0–2.0)
EOS%: 4.4 % (ref 0.0–7.0)
HCT: 27.5 % — ABNORMAL LOW (ref 34.8–46.6)
HGB: 9.9 g/dL — ABNORMAL LOW (ref 11.6–15.9)
MCH: 33 pg (ref 25.1–34.0)
MCHC: 35.9 g/dL (ref 31.5–36.0)
MONO#: 0.4 10*3/uL (ref 0.1–0.9)
NEUT%: 84.9 % — ABNORMAL HIGH (ref 38.4–76.8)
RDW: 20.1 % — ABNORMAL HIGH (ref 11.2–14.5)
WBC: 7 10*3/uL (ref 3.9–10.3)
lymph#: 0.3 10*3/uL — ABNORMAL LOW (ref 0.9–3.3)

## 2012-03-23 LAB — COMPREHENSIVE METABOLIC PANEL (CC13)
ALT: 22 U/L (ref 0–55)
AST: 21 U/L (ref 5–34)
Albumin: 3.1 g/dL — ABNORMAL LOW (ref 3.5–5.0)
CO2: 23 mEq/L (ref 22–29)
Calcium: 9.4 mg/dL (ref 8.4–10.4)
Chloride: 102 mEq/L (ref 98–107)
Potassium: 3.6 mEq/L (ref 3.5–5.1)
Total Protein: 7.4 g/dL (ref 6.4–8.3)

## 2012-03-23 MED ORDER — HYDROCOD POLST-CHLORPHEN POLST 10-8 MG/5ML PO LQCR: 5.0000 mL | Freq: Two times a day (BID) | ORAL | Status: DC

## 2012-03-23 NOTE — Progress Notes (Signed)
Radiation Oncology         [phone] ________________________________  Name: Pamela Levine MRN: [id number]  Date: 03/23/2012  DOB: 1945/03/21  Follow-Up Visit Note  CC: [name], MD  Leslye Peer, MD  Diagnosis:   Stage IIIa non-small cell lung cancer  Interval Since Last Radiation:  1  months  Narrative:  The patient returns today for routine follow-up.  She is doing reasonably well. She does have a dry irritating cough which does keep her awake at night. In light of this issue I have given her a prescription for Tussionex to take prior to bedtime. Patient denies any green sputum production or hemoptysis. She denies any pain in the chest area. She has minimal swallowing issues at this time. She did undergo a chest CT scan on February 21 which showed stable disease without any significant progression or significant tumor shrinkage. In light of this issue the patient will proceed with additional chemotherapy.                              ALLERGIES:  has no allergies on file.  Meds: Current Outpatient Prescriptions  Medication Sig Dispense Refill  . amLODipine (NORVASC) 10 MG tablet Take 1 tablet (10 mg total) by mouth daily.  90 tablet  3  . Artificial Tear Ointment (DRY EYES OP) Place 1 drop into both eyes 3 (three) times daily as needed. For dry eyes      . aspirin 81 MG chewable tablet Chew 81 mg by mouth daily.        . benzonatate (TESSALON) 200 MG capsule Take 1 capsule (200 mg total) by mouth 3 (three) times daily as needed for cough (swallow whole).  30 capsule  3  . Calcium Carbonate-Vitamin D (CALTRATE 600+D) 600-400 MG-UNIT per tablet Take 2 tablets by mouth daily.       . ferrous sulfate 325 (65 FE) MG tablet Take 325 mg by mouth daily.      . fluticasone (FLONASE) 50 MCG/ACT nasal spray Place 2 sprays into the nose daily.  16 g  6  . metoprolol succinate (TOPROL-XL) 50 MG 24 hr tablet Take 1 tablet (50 mg total) by mouth daily. Take with or immediately following a  meal.  90 tablet  3  . Multiple Vitamins-Minerals (CENTRUM SILVER PO) Take 1 tablet by mouth daily.       Marland Kitchen olmesartan-hydrochlorothiazide (BENICAR HCT) 40-25 MG per tablet Take 1 tablet by mouth daily.  90 tablet  3  . simvastatin (ZOCOR) 20 MG tablet Take 1 tablet (20 mg total) by mouth daily.  90 tablet  3  . chlorpheniramine-HYDROcodone (TUSSIONEX) 10-8 MG/5ML LQCR Take 5 mLs by mouth every 12 (twelve) hours.  140 mL  0  . methylPREDNISolone (MEDROL, PAK,) 4 MG tablet follow package directions  21 tablet  0  . prochlorperazine (COMPAZINE) 10 MG tablet Take 10 mg by mouth every 6 (six) hours as needed.       No current facility-administered medications for this encounter.    Physical Findings: The patient is in no acute distress. Patient is alert and oriented.  weight is 117 lb 4.8 oz (53.207 kg). Her oral temperature is 98 F (36.7 C). Her blood pressure is 139/56 and her pulse is 77. Her respiration is 18 and oxygen saturation is 100%. .  No palpable supraclavicular or axillary adenopathy. The lungs are clear to auscultation. The heart has regular rhythm  and rate. Examination of the skin of the treatment area reveals some mild dryness to the radiation portals. There is no significant skin reaction at this time.  Lab Findings: Lab Results  Component Value Date   WBC 7.0 03/23/2012   HGB 9.9* 03/23/2012   HCT 27.5* 03/23/2012   MCV 92.0 03/23/2012   PLT 180 03/23/2012    @LASTCHEM @  Radiographic Findings: Ct Chest W Contrast  03/20/2012  *RADIOLOGY REPORT*  Clinical Data: Lung cancer.  Restaging.  CT CHEST WITH CONTRAST  Technique:  Multidetector CT imaging of the chest was performed following the standard protocol during bolus administration of intravenous contrast.  Contrast: 80mL OMNIPAQUE IOHEXOL 300 MG/ML  SOLN  Comparison: PET CT 11/14/2011.Chest CT 10/23/2011.  Findings: The chest wall is unremarkable and stable.  No breast masses, supraclavicular or axillary lymphadenopathy.  The  bony thorax is intact.  Stable degenerative changes involving the thoracic spine.  No destructive bone lesions or spinal canal compromise.  The heart is normal in size and stable.  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  Advanced atherosclerotic calcifications at the aortic arch and involving the descending thoracic aorta.  No dissection. Significant three-vessel coronary artery calcifications are noted. The esophagus is grossly normal.  Small residual hilar lymph nodes are noted on the right side. These are stable.  No new adenopathy.  Stable right apical lung mass with central calcification and surrounding dense scarring changes.  There are stable underlying emphysematous changes.  No CT findings to suggest pulmonary metastatic disease.  No acute overlying pulmonary process.  No pleural effusion.  The upper abdomen is unremarkable.  IMPRESSION:  1.  Stable right apical lung mass. 2.  Stable right hilar lymph nodes. 3.  No findings for progressive disease or metastatic disease.   Original Report Authenticated By: Rudie Meyer, M.D.     Impression:  The patient is recovering from the effects of radiation.   Plan:  She will proceed with additional chemotherapy as above. Routine followup in 3 months.  _____________________________________  -----------------------------------  [name], PhD, MD

## 2012-03-23 NOTE — Progress Notes (Signed)
Put fmla form on nurse's desk °

## 2012-03-23 NOTE — Telephone Encounter (Signed)
Per staff message and POF I have scheduled appts.  JMW  

## 2012-03-23 NOTE — Telephone Encounter (Signed)
Phoned patient to explain per Judeth Cornfield, RN , [facility] nurse, the patient did not need to pre medicate prior to chemo infusion on Wednesday. Patient verbalized understanding.

## 2012-03-23 NOTE — Progress Notes (Signed)
Patient presents to the clinic today accompanied by her significant other for follow up with Dr. Roselind Messier. Patient alert and oriented to person, place, and time. No distress noted. Steady gait noted. Pleasant affect noted. Patient denies pain at this time. Patient reports that she was informed by Dr. Sofie Hartigan that her tumor is no smaller but, no larger. Dr. Sofie Hartigan plans to begin chemotherapy Wednesday of this week first of three planned treatments. Patient reports that she continues to take tessalon as her cough continues to be dry and worse at night. Patient denies shortness of breath. Patient reports that her energy level is improving. Patient denies nausea, vomiting, headache, or dizziness. Reported all findings to Dr. Roselind Messier.

## 2012-03-23 NOTE — Telephone Encounter (Deleted)
Phoned patient to explain that per Judeth Cornfield, [place]

## 2012-03-23 NOTE — Telephone Encounter (Signed)
Short Term Disability Form and Claim Reimbursement Form given to [facility] in medical management.  SLJ

## 2012-03-23 NOTE — Telephone Encounter (Signed)
gv and printed appt schedule for pt for Feb...emailed and s/w michelle to add tx

## 2012-03-23 NOTE — Progress Notes (Signed)
[facility] [phone]   [phone]   OFFICE PROGRESS NOTE  [name], MD [address] [place] [address], [place] [address]  DIAGNOSIS: Stage IIIA (T1a, N2, M0) non-small cell lung cancer, squamous cell carcinoma diagnosed in October of 2013.   PRIOR THERAPY: Concurrent chemoradiation with weekly carboplatin for AUC of 2 and paclitaxel 45 mg/M2.   CURRENT THERAPY: The patient will start later this week the first dose of consolidation systemic chemotherapy with carboplatin for AUC of 5 and paclitaxel and 75 mg/M2 with Neulasta support every 3 weeks.  INTERVAL HISTORY: Pamela Levine 67 y.o. female returns to the clinic today for followup visit accompanied her husband and son. The patient is feeling fine today with no specific complaints she tolerated the previous course of concurrent chemoradiation fairly well with no significant adverse effects. She continues to have mild cough but no significant chest pain, shortness breath, hemoptysis needed she denied having any significant nausea or vomiting. The patient had repeat CT scan of the chest performed recently and she is here for evaluation and discussion of her scan results.  REVIEW OF SYSTEMS:   Review of Systems  Constitutional: Negative for fever, chills and weight loss.  HENT: Negative for hearing loss and sore throat.   Eyes: Negative for blurred vision and double vision.  Respiratory: Positive for cough. Negative for hemoptysis, shortness of breath and wheezing.   Cardiovascular: Negative for chest pain, palpitations and orthopnea.  Gastrointestinal: Negative for nausea, vomiting and diarrhea.  Genitourinary: Negative for dysuria and frequency.  Musculoskeletal: Negative for back pain.  Neurological: Negative for dizziness, tingling, speech change and headaches.  Psychiatric/Behavioral: Negative for substance abuse. The patient is not nervous/anxious.     ALLERGIES:   has No Known Allergies.  MEDICATIONS:  Current Outpatient Prescriptions  Medication Sig Dispense Refill  . amLODipine (NORVASC) 10 MG tablet Take 1 tablet (10 mg total) by mouth daily.  90 tablet  3  . Artificial Tear Ointment (DRY EYES OP) Place 1 drop into both eyes 3 (three) times daily as needed. For dry eyes      . aspirin 81 MG chewable tablet Chew 81 mg by mouth daily.        . benzonatate (TESSALON) 200 MG capsule Take 1 capsule (200 mg total) by mouth 3 (three) times daily as needed for cough (swallow whole).  30 capsule  3  . Calcium Carbonate-Vitamin D (CALTRATE 600+D) 600-400 MG-UNIT per tablet Take 2 tablets by mouth daily.       . ferrous sulfate 325 (65 FE) MG tablet Take 325 mg by mouth daily.      . fluticasone (FLONASE) 50 MCG/ACT nasal spray Place 2 sprays into the nose daily.  16 g  6  . methylPREDNISolone (MEDROL, PAK,) 4 MG tablet follow package directions  21 tablet  0  . metoprolol succinate (TOPROL-XL) 50 MG 24 hr tablet Take 1 tablet (50 mg total) by mouth daily. Take with or immediately following a meal.  90 tablet  3  . Multiple Vitamins-Minerals (CENTRUM SILVER PO) Take 1 tablet by mouth daily.       Marland Kitchen olmesartan-hydrochlorothiazide (BENICAR HCT) 40-25 MG per tablet Take 1 tablet by mouth daily.  90 tablet  3  . prochlorperazine (COMPAZINE) 10 MG tablet Take 10 mg by mouth every 6 (six) hours as needed.      . simvastatin (ZOCOR) 20 MG tablet Take 1 tablet (20 mg total) by mouth  daily.  90 tablet  3   No current facility-administered medications for this visit.    Blood pressure 134/67, pulse 84, temperature 97.2 F (36.2 C), temperature source Oral, resp. rate 20, height 5' 7.5" (1.715 m), weight 116 lb (52.617 kg), last menstrual period 01/29/1976.  PHYSICAL EXAMINATION:  Physical Exam  Constitutional: She is oriented to person, place, and time. She appears well-developed and well-nourished. No distress.  HENT:  Head: Normocephalic and atraumatic.    Mouth/Throat: Oropharynx is clear and moist.  Eyes: EOM are normal.  Cardiovascular: Normal rate, regular rhythm and normal heart sounds.  Exam reveals no gallop and no friction rub.   No murmur heard. Pulmonary/Chest: She has no wheezes. She exhibits no tenderness.  Abdominal: Soft. Bowel sounds are normal. She exhibits no distension.  Musculoskeletal: She exhibits no edema and no tenderness.  Lymphadenopathy:    She has no cervical adenopathy.  Neurological: She is alert and oriented to person, place, and time. She has normal reflexes.  Psychiatric: She has a normal mood and affect.    ECOG PERFORMANCE STATUS: 1  LABORATORY DATA: Lab Results  Component Value Date   WBC 7.0 03/23/2012   HGB 9.9* 03/23/2012   HCT 27.5* 03/23/2012   MCV 92.0 03/23/2012   PLT 180 03/23/2012      Chemistry      Component Value Date/Time   NA 132* 02/17/2012 0836   NA 141 11/26/2011 1430   K 4.9 02/17/2012 0836   K 3.4* 11/26/2011 1430   CL 94* 02/17/2012 0836   CL 101 11/26/2011 1430   CO2 29 02/17/2012 0836   CO2 30 11/26/2011 1430   BUN 28.0* 02/17/2012 0836   BUN 29* 11/26/2011 1430   CREATININE 1.2* 02/17/2012 0836   CREATININE 1.27* 11/26/2011 1430      Component Value Date/Time   CALCIUM 10.1 02/17/2012 0836   CALCIUM 10.0 11/26/2011 1430   ALKPHOS 103 02/17/2012 0836   ALKPHOS 100 11/26/2011 1430   AST 19 02/17/2012 0836   AST 25 11/26/2011 1430   ALT 25 02/17/2012 0836   ALT 17 11/26/2011 1430   BILITOT 0.24 02/17/2012 0836   BILITOT 0.1* 11/26/2011 1430       RADIOGRAPHIC STUDIES: Ct Chest W Contrast  03/20/2012  *RADIOLOGY REPORT*  Clinical Data: Lung cancer.  Restaging.  CT CHEST WITH CONTRAST  Technique:  Multidetector CT imaging of the chest was performed following the standard protocol during bolus administration of intravenous contrast.  Contrast: 80mL OMNIPAQUE IOHEXOL 300 MG/ML  SOLN  Comparison: PET CT 11/14/2011.Chest CT 10/23/2011.  Findings: The chest wall is unremarkable  and stable.  No breast masses, supraclavicular or axillary lymphadenopathy.  The bony thorax is intact.  Stable degenerative changes involving the thoracic spine.  No destructive bone lesions or spinal canal compromise.  The heart is normal in size and stable.  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  Advanced atherosclerotic calcifications at the aortic arch and involving the descending thoracic aorta.  No dissection. Significant three-vessel coronary artery calcifications are noted. The esophagus is grossly normal.  Small residual hilar lymph nodes are noted on the right side. These are stable.  No new adenopathy.  Stable right apical lung mass with central calcification and surrounding dense scarring changes.  There are stable underlying emphysematous changes.  No CT findings to suggest pulmonary metastatic disease.  No acute overlying pulmonary process.  No pleural effusion.  The upper abdomen is unremarkable.  IMPRESSION:  1.  Stable right apical lung mass. 2.  Stable right hilar lymph nodes. 3.  No findings for progressive disease or metastatic disease.   Original Report Authenticated By: Rudie Meyer, M.D.     ASSESSMENT: This is a very pleasant 67 years old white female with a stage IIIa non-small cell lung cancer status post concurrent chemoradiation with no significant improvement in her disease but also no evidence for disease progression.  PLAN: I discussed the scan results with the patient and her family. I recommended for her to consider consolidation chemotherapy with carboplatin for AUC of 5 and paclitaxel 175 mg/M2 every 3 weeks with Neulasta support. I discussed with the patient adverse effect of this treatment including but not limited to alopecia, suppression, nausea and vomiting, peripheral neuropathy, liver or in dysfunction. The patient expected to start the first cycle of this treatment later this week. She would come back for followup  visit in 3 weeks with the start of cycle #2. The patient and her family agreed to the current plan. She was advised to call me immediately she has any concerning symptoms in the interval. Time spent with the patient during this visit was 30 minutes with more than 50% of the time of consultation and discussion of her treatment plan.  All questions were answered. The patient knows to call the clinic with any problems, questions or concerns. We can certainly see the patient much sooner if necessary.  Bethannie Iglehart k. Arbutus Ped, MD, PhD

## 2012-03-23 NOTE — Patient Instructions (Signed)
No evidence for disease progression on his recent scan. We discussed consolidation chemotherapy with carboplatin and paclitaxel.

## 2012-03-25 ENCOUNTER — Other Ambulatory Visit (HOSPITAL_BASED_OUTPATIENT_CLINIC_OR_DEPARTMENT_OTHER): Payer: BC Managed Care – PPO | Admitting: Lab

## 2012-03-25 ENCOUNTER — Ambulatory Visit (HOSPITAL_BASED_OUTPATIENT_CLINIC_OR_DEPARTMENT_OTHER): Payer: BC Managed Care – PPO

## 2012-03-25 LAB — COMPREHENSIVE METABOLIC PANEL (CC13)
ALT: 21 U/L (ref 0–55)
AST: 21 U/L (ref 5–34)
Albumin: 3.1 g/dL — ABNORMAL LOW (ref 3.5–5.0)
Alkaline Phosphatase: 87 U/L (ref 40–150)
BUN: 28.8 mg/dL — ABNORMAL HIGH (ref 7.0–26.0)
Potassium: 3.8 mEq/L (ref 3.5–5.1)
Sodium: 133 mEq/L — ABNORMAL LOW (ref 136–145)

## 2012-03-25 LAB — CBC WITH DIFFERENTIAL/PLATELET
BASO%: 0.5 % (ref 0.0–2.0)
EOS%: 4.7 % (ref 0.0–7.0)
MCH: 31.2 pg (ref 25.1–34.0)
MCHC: 35 g/dL (ref 31.5–36.0)
MONO#: 0.6 10*3/uL (ref 0.1–0.9)
NEUT%: 82.2 % — ABNORMAL HIGH (ref 38.4–76.8)
RBC: 2.98 10*6/uL — ABNORMAL LOW (ref 3.70–5.45)
RDW: 18.4 % — ABNORMAL HIGH (ref 11.2–14.5)
WBC: 7.3 10*3/uL (ref 3.9–10.3)
lymph#: 0.4 10*3/uL — ABNORMAL LOW (ref 0.9–3.3)
nRBC: 0 % (ref 0–0)

## 2012-03-25 MED ORDER — DIPHENHYDRAMINE HCL 50 MG/ML IJ SOLN
50.0000 mg | Freq: Once | INTRAMUSCULAR | Status: AC
  Administered 2012-03-25: 50 mg via INTRAVENOUS

## 2012-03-25 MED ORDER — ONDANSETRON 16 MG/50ML IVPB (CHCC)
16.0000 mg | Freq: Once | INTRAVENOUS | Status: AC
  Administered 2012-03-25: 16 mg via INTRAVENOUS

## 2012-03-25 MED ORDER — FAMOTIDINE IN NACL 20-0.9 MG/50ML-% IV SOLN
20.0000 mg | Freq: Once | INTRAVENOUS | Status: AC
  Administered 2012-03-25: 20 mg via INTRAVENOUS

## 2012-03-25 MED ORDER — CARBOPLATIN CHEMO INJECTION 450 MG/45ML
272.5000 mg | Freq: Once | INTRAVENOUS | Status: AC
  Administered 2012-03-25: 270 mg via INTRAVENOUS
  Filled 2012-03-25: qty 27

## 2012-03-25 MED ORDER — PACLITAXEL CHEMO INJECTION 300 MG/50ML
175.0000 mg/m2 | Freq: Once | INTRAVENOUS | Status: AC
  Administered 2012-03-25: 276 mg via INTRAVENOUS
  Filled 2012-03-25: qty 46

## 2012-03-25 MED ORDER — DEXAMETHASONE SODIUM PHOSPHATE 4 MG/ML IJ SOLN
20.0000 mg | Freq: Once | INTRAMUSCULAR | Status: AC
  Administered 2012-03-25: 20 mg via INTRAVENOUS

## 2012-03-25 MED ORDER — SODIUM CHLORIDE 0.9 % IV SOLN
Freq: Once | INTRAVENOUS | Status: AC
  Administered 2012-03-25: 09:00:00 via INTRAVENOUS

## 2012-03-25 NOTE — Patient Instructions (Addendum)
[facility] Discharge Instructions for Patients Receiving Chemotherapy  Today you received the following chemotherapy agents taxol/carbo  To help prevent nausea and vomiting after your treatment, we encourage you to take your nausea medication  and take it as often as prescribed   If you develop nausea and vomiting that is not controlled by your nausea medication, call the clinic. If it is after clinic hours your family physician or the after hours number for the clinic or go to the Emergency Department.   BELOW ARE SYMPTOMS THAT SHOULD BE REPORTED IMMEDIATELY:  *FEVER GREATER THAN 100.5 F  *CHILLS WITH OR WITHOUT FEVER  NAUSEA AND VOMITING THAT IS NOT CONTROLLED WITH YOUR NAUSEA MEDICATION  *UNUSUAL SHORTNESS OF BREATH  *UNUSUAL BRUISING OR BLEEDING  TENDERNESS IN MOUTH AND THROAT WITH OR WITHOUT PRESENCE OF ULCERS  *URINARY PROBLEMS  *BOWEL PROBLEMS  UNUSUAL RASH Items with * indicate a potential emergency and should be followed up as soon as possible.  One of the nurses will contact you 24 hours after your treatment. Please let the nurse know about any problems that you may have experienced. Feel free to call the clinic you have any questions or concerns. The clinic phone number is [phone].   I have been informed and understand all the instructions given to me. I know to contact the clinic, my physician, or go to the Emergency Department if any problems should occur. I do not have any questions at this time, but understand that I may call the clinic during office hours   should I have any questions or need assistance in obtaining follow up care.    __________________________________________  _____________  __________ Signature of Patient or Authorized Representative            Date                   Time    __________________________________________ Nurse's Signature    

## 2012-03-26 ENCOUNTER — Telehealth: Payer: Self-pay | Admitting: *Deleted

## 2012-03-26 ENCOUNTER — Encounter: Payer: Self-pay | Admitting: Internal Medicine

## 2012-03-26 ENCOUNTER — Ambulatory Visit (HOSPITAL_BASED_OUTPATIENT_CLINIC_OR_DEPARTMENT_OTHER): Payer: BC Managed Care – PPO

## 2012-03-26 DIAGNOSIS — C3410 Malignant neoplasm of upper lobe, unspecified bronchus or lung: Secondary | ICD-10-CM

## 2012-03-26 MED ORDER — PEGFILGRASTIM INJECTION 6 MG/0.6ML
6.0000 mg | Freq: Once | SUBCUTANEOUS | Status: AC
  Administered 2012-03-26: 6 mg via SUBCUTANEOUS
  Filled 2012-03-26: qty 0.6

## 2012-03-26 NOTE — Progress Notes (Signed)
Faxed disability form to [employer] @ [phone] °

## 2012-03-26 NOTE — Telephone Encounter (Signed)
Pamela Levine here for Neulasta injection following 1st taxol/carbo chemo treatment.  States she is doing fine.  No nausea, vomiting, or diarrhea.  Is drinking her fluids and eating well.  All questions answered.  Knows to call the clinic if she has any problems or questions.

## 2012-03-26 NOTE — Telephone Encounter (Signed)
Short Term Disability papers signed by Dr Donnald Garre and returned to [place] in medical management.  SLJ

## 2012-03-27 ENCOUNTER — Other Ambulatory Visit: Payer: Self-pay | Admitting: Physician Assistant

## 2012-04-01 ENCOUNTER — Encounter: Payer: Self-pay | Admitting: Internal Medicine

## 2012-04-01 ENCOUNTER — Other Ambulatory Visit (HOSPITAL_BASED_OUTPATIENT_CLINIC_OR_DEPARTMENT_OTHER): Payer: BC Managed Care – PPO

## 2012-04-01 LAB — CBC WITH DIFFERENTIAL/PLATELET
Basophils Absolute: 0.1 10*3/uL (ref 0.0–0.1)
EOS%: 2.4 % (ref 0.0–7.0)
HGB: 9.1 g/dL — ABNORMAL LOW (ref 11.6–15.9)
MCH: 31.9 pg (ref 25.1–34.0)
MCHC: 34 g/dL (ref 31.5–36.0)
MCV: 93.7 fL (ref 79.5–101.0)
MONO%: 5.1 % (ref 0.0–14.0)
NEUT%: 89.3 % — ABNORMAL HIGH (ref 38.4–76.8)
RDW: 19.3 % — ABNORMAL HIGH (ref 11.2–14.5)

## 2012-04-01 LAB — COMPREHENSIVE METABOLIC PANEL (CC13)
AST: 30 U/L (ref 5–34)
Alkaline Phosphatase: 153 U/L — ABNORMAL HIGH (ref 40–150)
BUN: 40.1 mg/dL — ABNORMAL HIGH (ref 7.0–26.0)
Creatinine: 1.5 mg/dL — ABNORMAL HIGH (ref 0.6–1.1)
Potassium: 4.3 mEq/L (ref 3.5–5.1)

## 2012-04-01 NOTE — Progress Notes (Signed)
Pt is denied for financial assistance.  Based on income she is overqualified. I will send denial letter today.

## 2012-04-02 ENCOUNTER — Telehealth: Payer: Self-pay | Admitting: *Deleted

## 2012-04-02 NOTE — Telephone Encounter (Signed)
Critical Illness Claim Form signed by Dr Donnald Garre, returned to Kathrin Penner' inbox.  SLJ

## 2012-04-02 NOTE — Telephone Encounter (Signed)
Called and informed pt to increase fluid intake.  She verbalized understanding.  SLJ

## 2012-04-02 NOTE — Telephone Encounter (Signed)
Message copied by Caren Griffins on Thu Apr 02, 2012  2:10 PM ------      Message from: Conni Slipper      Created: Fri Mar 27, 2012  1:19 PM       Abnormal results, please call and notify patient to push po fluids. Renal function slightly worse than baseline ------

## 2012-04-07 ENCOUNTER — Telehealth: Payer: Self-pay | Admitting: Family Medicine

## 2012-04-07 NOTE — Telephone Encounter (Signed)
That sounds good - will decide at that time

## 2012-04-07 NOTE — Telephone Encounter (Signed)
Caller: Pamela Levine/Patient; Phone: [phone]; Reason for Call: Patient is seen by oncology every week for labwork.  States has appt with Dr.  Milinda Antis 04/13/12 "for bloodwork" and wonders if she will need to be seen, since labwork is done weekly.  Advised per Epic last visit 01/14/12 for hypertension; Dr.  Milinda Antis wanted to see her again in 3 months for follow up.  Advised to keep appt 04/13/12; may be able to use labwork results in Epic, but Dr.  Milinda Antis will decide if needs other bloodwork during the visit 04/13/12.  No further questions or concerns at this time.  Krs/can

## 2012-04-08 ENCOUNTER — Other Ambulatory Visit (HOSPITAL_BASED_OUTPATIENT_CLINIC_OR_DEPARTMENT_OTHER): Payer: BC Managed Care – PPO

## 2012-04-08 DIAGNOSIS — C3410 Malignant neoplasm of upper lobe, unspecified bronchus or lung: Secondary | ICD-10-CM

## 2012-04-08 LAB — COMPREHENSIVE METABOLIC PANEL (CC13)
AST: 30 U/L (ref 5–34)
Alkaline Phosphatase: 125 U/L (ref 40–150)
BUN: 20.7 mg/dL (ref 7.0–26.0)
Calcium: 9.9 mg/dL (ref 8.4–10.4)
Chloride: 91 mEq/L — ABNORMAL LOW (ref 98–107)
Creatinine: 1.2 mg/dL — ABNORMAL HIGH (ref 0.6–1.1)

## 2012-04-08 LAB — CBC WITH DIFFERENTIAL/PLATELET
Basophils Absolute: 0 10*3/uL (ref 0.0–0.1)
EOS%: 1.5 % (ref 0.0–7.0)
HCT: 26.2 % — ABNORMAL LOW (ref 34.8–46.6)
HGB: 9.2 g/dL — ABNORMAL LOW (ref 11.6–15.9)
MCH: 33.1 pg (ref 25.1–34.0)
MCV: 94 fL (ref 79.5–101.0)
MONO%: 5.7 % (ref 0.0–14.0)
NEUT%: 88.5 % — ABNORMAL HIGH (ref 38.4–76.8)
Platelets: 121 10*3/uL — ABNORMAL LOW (ref 145–400)

## 2012-04-13 ENCOUNTER — Encounter: Payer: Self-pay | Admitting: Family Medicine

## 2012-04-13 ENCOUNTER — Ambulatory Visit (INDEPENDENT_AMBULATORY_CARE_PROVIDER_SITE_OTHER): Payer: BC Managed Care – PPO | Admitting: Family Medicine

## 2012-04-13 VITALS — BP 130/60 | HR 82 | Temp 98.3°F | Ht 67.5 in | Wt 115.0 lb

## 2012-04-13 DIAGNOSIS — I10 Essential (primary) hypertension: Secondary | ICD-10-CM

## 2012-04-13 DIAGNOSIS — E871 Hypo-osmolality and hyponatremia: Secondary | ICD-10-CM

## 2012-04-13 DIAGNOSIS — E785 Hyperlipidemia, unspecified: Secondary | ICD-10-CM

## 2012-04-13 MED ORDER — OLMESARTAN MEDOXOMIL 40 MG PO TABS: 40.0000 mg | ORAL_TABLET | Freq: Every day | ORAL | Status: DC

## 2012-04-13 NOTE — Patient Instructions (Addendum)
Stop benicar hct and change to plain benicar without the hct  No other medicine changes  Get labs on Wednesday as planned and we will watch your sodium level (let your specialists know we changed your medicine)  Take care of yourself If your bp goes up significantly let me know  Follow up in 6 months

## 2012-04-13 NOTE — Progress Notes (Signed)
Subjective:    Patient ID: Pamela Levine, female    DOB: [birth year], 67 y.o.   MRN: [id number]  HPI Here for f/u of HTN   Is undergoing tx for lung cancer She is wiped out and her hair is falling out from the chemo  Wt is down 2 lb with bmi of 17-- is trying to eat and also drinking more fluids - told to drink more fluids due to cr Also drinking ensure   bp is stable today  No cp or palpitations or headaches or edema  No side effects to medicines  BP Readings from Last 3 Encounters:  04/13/12 130/60  03/26/12 158/72  03/25/12 166/65     Is on toprol xl and norvasc and benicar hct     Chemistry      Component Value Date/Time   NA 126 Repeated and Verified* 04/08/2012 0828   NA 141 11/26/2011 1430   K 4.6 04/08/2012 0828   K 3.4* 11/26/2011 1430   CL 91 Repeated and Verified* 04/08/2012 0828   CL 101 11/26/2011 1430   CO2 27 04/08/2012 0828   CO2 30 11/26/2011 1430   BUN 20.7 04/08/2012 0828   BUN 29* 11/26/2011 1430   CREATININE 1.2* 04/08/2012 0828   CREATININE 1.27* 11/26/2011 1430      Component Value Date/Time   CALCIUM 9.9 04/08/2012 0828   CALCIUM 10.0 11/26/2011 1430   ALKPHOS 125 04/08/2012 0828   ALKPHOS 100 11/26/2011 1430   AST 30 04/08/2012 0828   AST 25 11/26/2011 1430   ALT 42 04/08/2012 0828   ALT 17 11/26/2011 1430   BILITOT 0.26 04/08/2012 0828   BILITOT 0.1* 11/26/2011 1430      Her next chemo is wed and booster Thursday   Is on zocor for her lipids Lab Results  Component Value Date   CHOL 178 10/18/2011   HDL 47.00 10/18/2011   LDLCALC 112* 10/18/2011   TRIG 93.0 10/18/2011   CHOLHDL 4 10/18/2011    Patient Active Problem List  Diagnosis  . Malignant neoplasm of skin of parts of face  . HYPOGLYCEMIA, UNSPECIFIED  . HYPERLIPIDEMIA  . UNSPECIFIED ANEMIA  . HYPERTENSION  . ALLERGIC RHINITIS, SEASONAL  . HYPERGLYCEMIA  . TOBACCO USE, QUIT  . CARCINOMA, BASAL CELL, FACE  . Heart murmur  . Routine general medical examination at a health care  facility  . Weight loss, non-intentional  . Other screening mammogram  . Abnormal chest x-ray  . Colon cancer screening  . Ascending aortic aneurysm  . Pleural disorder  . COPD (chronic obstructive pulmonary disease)  . Mass of lung  . Lung cancer  . ETD (eustachian tube dysfunction)   Past Medical History  Diagnosis Date  . Hyperlipidemia   . Hypertension   . History  of basal cell carcinoma   . Light cigarette smoker   . Kidney stone 01/2000    stent. Renal U/S normal in 12/01.  Marland Kitchen Heart murmur   . Arthritis   . Osteoarthritis   . Cancer     skin  . Lung mass October 2013    RUL  . COPD (chronic obstructive pulmonary disease)   . Ascending aortic aneurysm   . Lung cancer 12/19/2011   Past Surgical History  Procedure Laterality Date  . Partial hysterectomy  1978    bleeding  . Kidney stone surgery  01/2000    stent  . Colonoscopy    . Video bronchoscopy with endobronchial ultrasound  11/27/2011  Procedure: VIDEO BRONCHOSCOPY WITH ENDOBRONCHIAL ULTRASOUND;  Surgeon: Leslye Peer, MD;  Location: [facility] OR;  Service: Pulmonary;  Laterality: N/A;   History  Substance Use Topics  . Smoking status: Former Smoker -- 25 years    Types: Cigarettes    Quit date: 01/29/2007  . Smokeless tobacco: Not on file  . Alcohol Use: Yes     Comment: occasional   Family History  Problem Relation Age of Onset  . Lung cancer Mother     lung- non smoker  . Hypertension Father   . Stroke Father     cerebral hemorrhage  . Lung cancer Sister     lung  . Diabetes Son    No Known Allergies Current Outpatient Prescriptions on File Prior to Visit  Medication Sig Dispense Refill  . amLODipine (NORVASC) 10 MG tablet Take 1 tablet (10 mg total) by mouth daily.  90 tablet  3  . Artificial Tear Ointment (DRY EYES OP) Place 1 drop into both eyes 3 (three) times daily as needed. For dry eyes      . aspirin 81 MG chewable tablet Chew 81 mg by mouth daily.        . benzonatate (TESSALON) 200  MG capsule Take 1 capsule (200 mg total) by mouth 3 (three) times daily as needed for cough (swallow whole).  30 capsule  3  . Calcium Carbonate-Vitamin D (CALTRATE 600+D) 600-400 MG-UNIT per tablet Take 2 tablets by mouth daily.       . chlorpheniramine-HYDROcodone (TUSSIONEX) 10-8 MG/5ML LQCR Take 5 mLs by mouth every 12 (twelve) hours.  140 mL  0  . ferrous sulfate 325 (65 FE) MG tablet Take 325 mg by mouth daily.      . fluticasone (FLONASE) 50 MCG/ACT nasal spray Place 2 sprays into the nose daily.  16 g  6  . methylPREDNISolone (MEDROL, PAK,) 4 MG tablet follow package directions  21 tablet  0  . metoprolol succinate (TOPROL-XL) 50 MG 24 hr tablet Take 1 tablet (50 mg total) by mouth daily. Take with or immediately following a meal.  90 tablet  3  . Multiple Vitamins-Minerals (CENTRUM SILVER PO) Take 1 tablet by mouth daily.       Marland Kitchen olmesartan-hydrochlorothiazide (BENICAR HCT) 40-25 MG per tablet Take 1 tablet by mouth daily.  90 tablet  3  . prochlorperazine (COMPAZINE) 10 MG tablet Take 10 mg by mouth every 6 (six) hours as needed.      . simvastatin (ZOCOR) 20 MG tablet Take 1 tablet (20 mg total) by mouth daily.  90 tablet  3   No current facility-administered medications on file prior to visit.    Review of Systems    Review of Systems  Constitutional: Negative for fever, appetite change,and unexpected weight change. pos for fatigue from chemo  Eyes: Negative for pain and visual disturbance.  Respiratory: Negative for cough and shortness of breath.   Cardiovascular: Negative for cp or palpitations    Gastrointestinal: Negative for nausea, diarrhea and constipation.  Genitourinary: Negative for urgency and frequency.  Skin: Negative for pallor or rash   MSK neg for weakness or cramping  Neurological: Negative for weakness, light-headedness, numbness and headaches. neg for seizures  Hematological: Negative for adenopathy. Does not bruise/bleed easily.  Psychiatric/Behavioral:  Negative for dysphoric mood. The patient is not nervous/anxious.      Objective:   Physical Exam  Constitutional: She appears well-developed and well-nourished. No distress.  Slim and well appearing   HENT:  Head: Normocephalic and atraumatic.  Mouth/Throat: Oropharynx is clear and moist.  Eyes: Conjunctivae and EOM are normal. Pupils are equal, round, and reactive to light. No scleral icterus.  Neck: Normal range of motion. Neck supple. No JVD present. Carotid bruit is not present. No thyromegaly present.  Cardiovascular: Normal rate, regular rhythm, normal heart sounds and intact distal pulses.  Exam reveals no gallop.   Pulmonary/Chest: Effort normal and breath sounds normal. No respiratory distress. She has no wheezes. She has no rales.  Diffusely distant bs   Abdominal: Soft. Bowel sounds are normal. She exhibits no distension, no abdominal bruit and no mass. There is no tenderness.  Musculoskeletal: She exhibits no edema.  Lymphadenopathy:    She has no cervical adenopathy.  Neurological: She is alert. She has normal reflexes. She displays no tremor. No cranial nerve deficit. She exhibits normal muscle tone. Coordination normal.  Skin: Skin is warm and dry. No rash noted. No erythema. No pallor.  Psychiatric: She has a normal mood and affect.  Seems fatigued but in good spirits          Assessment & Plan:

## 2012-04-13 NOTE — Assessment & Plan Note (Signed)
?   Due to chemo or her hct or both Will hold hct and watch bp  Will wear supp hose for edema  Not on feet at this time -may not be as bad as when she works Will continue regular labs with chemo

## 2012-04-13 NOTE — Assessment & Plan Note (Signed)
Lipids stable-checked in fall on zocor

## 2012-04-13 NOTE — Assessment & Plan Note (Signed)
bp is well controlled but sodium is low (? From chemo or hctz) Will hold the hctz and monitor closely-pt has cuff at home  Overall doing well with her cancer treatment

## 2012-04-14 ENCOUNTER — Telehealth: Payer: Self-pay | Admitting: *Deleted

## 2012-04-14 NOTE — Telephone Encounter (Signed)
STD form from [employer] given to [place] in medical records.  SLJ

## 2012-04-15 ENCOUNTER — Other Ambulatory Visit (HOSPITAL_BASED_OUTPATIENT_CLINIC_OR_DEPARTMENT_OTHER): Payer: BC Managed Care – PPO | Admitting: Lab

## 2012-04-15 ENCOUNTER — Telehealth: Payer: Self-pay | Admitting: Internal Medicine

## 2012-04-15 ENCOUNTER — Encounter: Payer: Self-pay | Admitting: Internal Medicine

## 2012-04-15 ENCOUNTER — Encounter: Payer: Self-pay | Admitting: Physician Assistant

## 2012-04-15 ENCOUNTER — Ambulatory Visit (HOSPITAL_BASED_OUTPATIENT_CLINIC_OR_DEPARTMENT_OTHER): Payer: BC Managed Care – PPO

## 2012-04-15 ENCOUNTER — Ambulatory Visit (HOSPITAL_BASED_OUTPATIENT_CLINIC_OR_DEPARTMENT_OTHER): Payer: BC Managed Care – PPO | Admitting: Physician Assistant

## 2012-04-15 DIAGNOSIS — C3410 Malignant neoplasm of upper lobe, unspecified bronchus or lung: Secondary | ICD-10-CM

## 2012-04-15 LAB — COMPREHENSIVE METABOLIC PANEL (CC13)
Albumin: 3.5 g/dL (ref 3.5–5.0)
Alkaline Phosphatase: 136 U/L (ref 40–150)
BUN: 26.9 mg/dL — ABNORMAL HIGH (ref 7.0–26.0)
CO2: 26 mEq/L (ref 22–29)
Calcium: 10.3 mg/dL (ref 8.4–10.4)
Chloride: 88 mEq/L — ABNORMAL LOW (ref 98–107)
Glucose: 107 mg/dl — ABNORMAL HIGH (ref 70–99)
Potassium: 4.4 mEq/L (ref 3.5–5.1)

## 2012-04-15 LAB — CBC WITH DIFFERENTIAL/PLATELET
Basophils Absolute: 0.1 10*3/uL (ref 0.0–0.1)
EOS%: 2.3 % (ref 0.0–7.0)
HCT: 28.6 % — ABNORMAL LOW (ref 34.8–46.6)
HGB: 10.1 g/dL — ABNORMAL LOW (ref 11.6–15.9)
MCH: 32.5 pg (ref 25.1–34.0)
NEUT%: 78 % — ABNORMAL HIGH (ref 38.4–76.8)
lymph#: 0.7 10*3/uL — ABNORMAL LOW (ref 0.9–3.3)

## 2012-04-15 MED ORDER — DEXAMETHASONE SODIUM PHOSPHATE 4 MG/ML IJ SOLN
20.0000 mg | Freq: Once | INTRAMUSCULAR | Status: AC
  Administered 2012-04-15: 20 mg via INTRAVENOUS

## 2012-04-15 MED ORDER — HYDROCODONE-HOMATROPINE 5-1.5 MG/5ML PO SYRP: 5.0000 mL | ORAL_SOLUTION | Freq: Four times a day (QID) | ORAL | Status: DC | PRN

## 2012-04-15 MED ORDER — ONDANSETRON 16 MG/50ML IVPB (CHCC)
16.0000 mg | Freq: Once | INTRAVENOUS | Status: AC
  Administered 2012-04-15: 16 mg via INTRAVENOUS

## 2012-04-15 MED ORDER — SODIUM CHLORIDE 0.9 % IV SOLN
Freq: Once | INTRAVENOUS | Status: AC
  Administered 2012-04-15: 11:00:00 via INTRAVENOUS

## 2012-04-15 MED ORDER — FAMOTIDINE IN NACL 20-0.9 MG/50ML-% IV SOLN
20.0000 mg | Freq: Once | INTRAVENOUS | Status: AC
  Administered 2012-04-15: 20 mg via INTRAVENOUS

## 2012-04-15 MED ORDER — SODIUM CHLORIDE 0.9 % IV SOLN
270.0000 mg | Freq: Once | INTRAVENOUS | Status: AC
  Administered 2012-04-15: 270 mg via INTRAVENOUS
  Filled 2012-04-15: qty 27

## 2012-04-15 MED ORDER — DIPHENHYDRAMINE HCL 50 MG/ML IJ SOLN
50.0000 mg | Freq: Once | INTRAMUSCULAR | Status: AC
  Administered 2012-04-15: 50 mg via INTRAVENOUS

## 2012-04-15 MED ORDER — PACLITAXEL CHEMO INJECTION 300 MG/50ML
175.0000 mg/m2 | Freq: Once | INTRAVENOUS | Status: AC
  Administered 2012-04-15: 276 mg via INTRAVENOUS
  Filled 2012-04-15: qty 46

## 2012-04-15 NOTE — Patient Instructions (Addendum)
Continue weekly labs as scheduled Increase your intake of food Follow up in 3 weeks prior to your next cycle of chemotherapy

## 2012-04-15 NOTE — Progress Notes (Signed)
Quick Note:  Call patient with the result and advise fluid restriction to 1200 cc ______

## 2012-04-15 NOTE — Progress Notes (Signed)
Put [employer] disability form on nurse's desk. °

## 2012-04-15 NOTE — Progress Notes (Signed)
Enrolled pt in the Neulasta First Step program.  I faxed signed form and activated card today.  °

## 2012-04-15 NOTE — Patient Instructions (Addendum)
[facility] Discharge Instructions for Patients Receiving Chemotherapy  Today you received the following chemotherapy agents Taxol/Carboplatin  To help prevent nausea and vomiting after your treatment, we encourage you to take your nausea medication as ordered.   If you develop nausea and vomiting that is not controlled by your nausea medication, call the clinic. If it is after clinic hours your family physician or the after hours number for the clinic or go to the Emergency Department.   BELOW ARE SYMPTOMS THAT SHOULD BE REPORTED IMMEDIATELY:  *FEVER GREATER THAN 100.5 F  *CHILLS WITH OR WITHOUT FEVER  NAUSEA AND VOMITING THAT IS NOT CONTROLLED WITH YOUR NAUSEA MEDICATION  *UNUSUAL SHORTNESS OF BREATH  *UNUSUAL BRUISING OR BLEEDING  TENDERNESS IN MOUTH AND THROAT WITH OR WITHOUT PRESENCE OF ULCERS  *URINARY PROBLEMS  *BOWEL PROBLEMS  UNUSUAL RASH Items with * indicate a potential emergency and should be followed up as soon as possible.  One of the nurses will contact you 24 hours after your treatment. Please let the nurse know about any problems that you may have experienced. Feel free to call the clinic you have any questions or concerns. The clinic phone number is [phone].   I have been informed and understand all the instructions given to me. I know to contact the clinic, my physician, or go to the Emergency Department if any problems should occur. I do not have any questions at this time, but understand that I may call the clinic during office hours   should I have any questions or need assistance in obtaining follow up care.    __________________________________________  _____________  __________ Signature of Patient or Authorized Representative            Date                   Time    __________________________________________ Nurse's Signature

## 2012-04-16 ENCOUNTER — Telehealth: Payer: Self-pay | Admitting: *Deleted

## 2012-04-16 ENCOUNTER — Ambulatory Visit (HOSPITAL_BASED_OUTPATIENT_CLINIC_OR_DEPARTMENT_OTHER): Payer: BC Managed Care – PPO

## 2012-04-16 DIAGNOSIS — Z5189 Encounter for other specified aftercare: Secondary | ICD-10-CM

## 2012-04-16 MED ORDER — PEGFILGRASTIM INJECTION 6 MG/0.6ML
6.0000 mg | Freq: Once | SUBCUTANEOUS | Status: AC
  Administered 2012-04-16: 6 mg via SUBCUTANEOUS
  Filled 2012-04-16: qty 0.6

## 2012-04-16 NOTE — Telephone Encounter (Signed)
Called and left a msg to call back.  SLJ

## 2012-04-16 NOTE — Telephone Encounter (Signed)
FMLA paperwork signed and returned to [place] in medical mgmt.  SLJ 

## 2012-04-16 NOTE — Progress Notes (Signed)
[facility]ENTER Telephone:([phone]) [phone]   [phone]   OFFICE PROGRESS NOTE  [name], MD [address] [place] [address], [place] [address]  DIAGNOSIS: Stage IIIA (T1a, N2, M0) non-small cell lung cancer, squamous cell carcinoma diagnosed in October of 2013.   PRIOR THERAPY: Concurrent chemoradiation with weekly carboplatin for AUC of 2 and paclitaxel 45 mg/M2.   CURRENT THERAPY:  Consolidation systemic chemotherapy with carboplatin for AUC of 5 and paclitaxel and 75 mg/M2 with Neulasta support every 3 weeks. Status post 1 cycle  INTERVAL HISTORY: Pamela Levine 67 y.o. female returns to the clinic today for followup visit accompanied her mother-in-law. She tolerated her first cycle of consolidation chemotherapy with carboplatin and paclitaxel with Neulasta support relatively well. She is experiencing decreased appetite. She also reports a dry hacking cough that tends to be worse at night. She requests a prescription for cough syrup but states that the Tussionex that she had in the past makes her too sleepy. She reports that her blood pressure medications have been changed by her primary care physician, eliminating the diuretic. She is a little concerned that she has had a problem with lower extremity swelling of the past. She voiced no other specific complaints today. She denied significant chest pain, shortness breath, or hemoptysis.  She denied having any significant nausea or vomiting.   REVIEW OF SYSTEMS:   Review of Systems  Constitutional: Negative for fever, chills and weight loss.  HENT: Negative for hearing loss and sore throat.   Eyes: Negative for blurred vision and double vision.  Respiratory: Positive for cough. Negative for hemoptysis, shortness of breath and wheezing.   Cardiovascular: Negative for chest pain, palpitations and orthopnea.  Gastrointestinal: Negative for nausea, vomiting and diarrhea.  Genitourinary: Negative for  dysuria and frequency.  Musculoskeletal: Negative for back pain.  Neurological: Negative for dizziness, tingling, speech change and headaches.  Psychiatric/Behavioral: Negative for substance abuse. The patient is not nervous/anxious.     ALLERGIES:  has No Known Allergies.  MEDICATIONS:  Current Outpatient Prescriptions  Medication Sig Dispense Refill  . amLODipine (NORVASC) 10 MG tablet Take 1 tablet (10 mg total) by mouth daily.  90 tablet  3  . Artificial Tear Ointment (DRY EYES OP) Place 1 drop into both eyes 3 (three) times daily as needed. For dry eyes      . aspirin 81 MG chewable tablet Chew 81 mg by mouth daily.        . benzonatate (TESSALON) 200 MG capsule Take 1 capsule (200 mg total) by mouth 3 (three) times daily as needed for cough (swallow whole).  30 capsule  3  . Calcium Carbonate-Vitamin D (CALTRATE 600+D) 600-400 MG-UNIT per tablet Take 2 tablets by mouth daily.       . chlorpheniramine-HYDROcodone (TUSSIONEX) 10-8 MG/5ML LQCR Take 5 mLs by mouth every 12 (twelve) hours.  140 mL  0  . ferrous sulfate 325 (65 FE) MG tablet Take 325 mg by mouth daily.      . fluticasone (FLONASE) 50 MCG/ACT nasal spray Place 2 sprays into the nose daily.  16 g  6  . HYDROcodone-homatropine (HYCODAN) 5-1.5 MG/5ML syrup Take 5 mLs by mouth every 6 (six) hours as needed for cough.  240 mL  0  . methylPREDNISolone (MEDROL, PAK,) 4 MG tablet follow package directions  21 tablet  0  . metoprolol succinate (TOPROL-XL) 50 MG 24 hr tablet Take 1 tablet (50 mg total) by mouth daily.  Take with or immediately following a meal.  90 tablet  3  . Multiple Vitamins-Minerals (CENTRUM SILVER PO) Take 1 tablet by mouth daily.       Marland Kitchen olmesartan (BENICAR) 40 MG tablet Take 1 tablet (40 mg total) by mouth daily.  30 tablet  5  . prochlorperazine (COMPAZINE) 10 MG tablet Take 10 mg by mouth every 6 (six) hours as needed.      . simvastatin (ZOCOR) 20 MG tablet Take 1 tablet (20 mg total) by mouth daily.  90  tablet  3   No current facility-administered medications for this visit.    Blood pressure 140/59, pulse 86, temperature 98.8 F (37.1 C), temperature source Oral, resp. rate 18, height 5' 7.5" (1.715 m), weight 115 lb (52.164 kg), last menstrual period 01/29/1976.  PHYSICAL EXAMINATION:  Physical Exam  Constitutional: She is oriented to person, place, and time. She appears well-developed and well-nourished. No distress.  HENT:  Head: Normocephalic and atraumatic.  Mouth/Throat: Oropharynx is clear and moist.  no evidence of thrush or mucositis Eyes: EOM are normal.  Cardiovascular: Normal rate, regular rhythm and normal heart sounds.  Exam reveals no gallop and no friction rub.   No murmur heard. Pulmonary/Chest: She has no wheezes. She exhibits no tenderness.  Abdominal: Soft. Bowel sounds are normal. She exhibits no distension.  Musculoskeletal: She exhibits no edema and no tenderness.  Lymphadenopathy:    She has no cervical adenopathy.  Neurological: She is alert and oriented to person, place, and time. She has normal reflexes.  Psychiatric: She has a normal mood and affect.    ECOG PERFORMANCE STATUS: 1  LABORATORY DATA: Lab Results  Component Value Date   WBC 7.1 04/15/2012   HGB 10.1* 04/15/2012   HCT 28.6* 04/15/2012   MCV 92.0 04/15/2012   PLT 166 04/15/2012      Chemistry      Component Value Date/Time   NA 124 Repeated and Verified* 04/15/2012 0844   NA 141 11/26/2011 1430   K 4.4 04/15/2012 0844   K 3.4* 11/26/2011 1430   CL 88 Repeated and Verified* 04/15/2012 0844   CL 101 11/26/2011 1430   CO2 26 04/15/2012 0844   CO2 30 11/26/2011 1430   BUN 26.9* 04/15/2012 0844   BUN 29* 11/26/2011 1430   CREATININE 1.2* 04/15/2012 0844   CREATININE 1.27* 11/26/2011 1430      Component Value Date/Time   CALCIUM 10.3 04/15/2012 0844   CALCIUM 10.0 11/26/2011 1430   ALKPHOS 136 04/15/2012 0844   ALKPHOS 100 11/26/2011 1430   AST 35* 04/15/2012 0844   AST 25 11/26/2011  1430   ALT 46 04/15/2012 0844   ALT 17 11/26/2011 1430   BILITOT 0.27 04/15/2012 0844   BILITOT 0.1* 11/26/2011 1430       RADIOGRAPHIC STUDIES: Ct Chest W Contrast  03/20/2012  *RADIOLOGY REPORT*  Clinical Data: Lung cancer.  Restaging.  CT CHEST WITH CONTRAST  Technique:  Multidetector CT imaging of the chest was performed following the standard protocol during bolus administration of intravenous contrast.  Contrast: 80mL OMNIPAQUE IOHEXOL 300 MG/ML  SOLN  Comparison: PET CT 11/14/2011.Chest CT 10/23/2011.  Findings: The chest wall is unremarkable and stable.  No breast masses, supraclavicular or axillary lymphadenopathy.  The bony thorax is intact.  Stable degenerative changes involving the thoracic spine.  No destructive bone lesions or spinal canal compromise.  The heart is normal in size and stable.  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  Advanced atherosclerotic calcifications at the aortic arch and involving the descending thoracic aorta.  No dissection. Significant three-vessel coronary artery calcifications are noted. The esophagus is grossly normal.  Small residual hilar lymph nodes are noted on the right side. These are stable.  No new adenopathy.  Stable right apical lung mass with central calcification and surrounding dense scarring changes.  There are stable underlying emphysematous changes.  No CT findings to suggest pulmonary metastatic disease.  No acute overlying pulmonary process.  No pleural effusion.  The upper abdomen is unremarkable.  IMPRESSION:  1.  Stable right apical lung mass. 2.  Stable right hilar lymph nodes. 3.  No findings for progressive disease or metastatic disease.   Original Report Authenticated By: Rudie Meyer, M.D.     ASSESSMENT/PLAN: This is a very pleasant 67 years old white female with a stage IIIa non-small cell lung cancer status post concurrent chemoradiation with no significant improvement in her  disease but also no evidence for disease progression. She is currently receiving consolidation chemotherapy in the form of carboplatin for an AUC of 5 and paclitaxel at 75 mg per meter squared given every 3 weeks with Neulasta support, status post 1 cycle. Patient was discussed with Dr. Arbutus Ped. She'll proceed with cycle #2 of her consolidation chemotherapy as scheduled today. She will continue with weekly labs consisting of a CBC differential and C. met. She'll return in 3 weeks prior to cycle #3 also with a repeat CBC differential and C. met. She is given a prescription for Hycodan cough syrup 5 MLS by mouth every 6 hours as needed for cough, 240 mls with no refill.  Chyna Kneece E, PA-C   She was advised to call  immediately she has any concerning symptoms in the interval.   All questions were answered. The patient knows to call the clinic with any problems, questions or concerns. We can certainly see the patient much sooner if necessary.

## 2012-04-16 NOTE — Telephone Encounter (Signed)
Spoke to pt, she verbalized understanding regarding fluid restriction.  SLJ

## 2012-04-16 NOTE — Telephone Encounter (Signed)
Message copied by Caren Griffins on Thu Apr 16, 2012  9:52 AM ------      Message from: Pamela Levine      Created: Wed Apr 15, 2012  7:08 PM       Call patient with the result and advise fluid restriction to 1200 cc ------

## 2012-04-16 NOTE — Patient Instructions (Addendum)

## 2012-04-17 ENCOUNTER — Telehealth: Payer: Self-pay

## 2012-04-17 ENCOUNTER — Encounter: Payer: Self-pay | Admitting: Internal Medicine

## 2012-04-17 NOTE — Telephone Encounter (Signed)
[facility] [address] faxed prior auth for Benicar; form is on Dr [employer] shelf.

## 2012-04-17 NOTE — Progress Notes (Signed)
Faxed disability form to [employer] @ [phone] °

## 2012-04-20 NOTE — Telephone Encounter (Signed)
Form faxed

## 2012-04-20 NOTE — Telephone Encounter (Signed)
Done and in IN box 

## 2012-04-22 ENCOUNTER — Ambulatory Visit (HOSPITAL_BASED_OUTPATIENT_CLINIC_OR_DEPARTMENT_OTHER): Payer: BC Managed Care – PPO | Admitting: Lab

## 2012-04-22 DIAGNOSIS — C3490 Malignant neoplasm of unspecified part of unspecified bronchus or lung: Secondary | ICD-10-CM

## 2012-04-22 DIAGNOSIS — C3410 Malignant neoplasm of upper lobe, unspecified bronchus or lung: Secondary | ICD-10-CM

## 2012-04-22 LAB — CBC WITH DIFFERENTIAL/PLATELET
Basophils Absolute: 0 10*3/uL (ref 0.0–0.1)
Eosinophils Absolute: 0.1 10*3/uL (ref 0.0–0.5)
HGB: 8.9 g/dL — ABNORMAL LOW (ref 11.6–15.9)
NEUT#: 12.6 10*3/uL — ABNORMAL HIGH (ref 1.5–6.5)
RDW: 16.7 % — ABNORMAL HIGH (ref 11.2–14.5)
WBC: 14.1 10*3/uL — ABNORMAL HIGH (ref 3.9–10.3)
lymph#: 0.5 10*3/uL — ABNORMAL LOW (ref 0.9–3.3)

## 2012-04-22 LAB — COMPREHENSIVE METABOLIC PANEL (CC13)
AST: 65 U/L — ABNORMAL HIGH (ref 5–34)
Albumin: 3.2 g/dL — ABNORMAL LOW (ref 3.5–5.0)
BUN: 32.3 mg/dL — ABNORMAL HIGH (ref 7.0–26.0)
Calcium: 9.9 mg/dL (ref 8.4–10.4)
Chloride: 93 mEq/L — ABNORMAL LOW (ref 98–107)
Glucose: 100 mg/dl — ABNORMAL HIGH (ref 70–99)
Potassium: 3.9 mEq/L (ref 3.5–5.1)
Sodium: 132 mEq/L — ABNORMAL LOW (ref 136–145)
Total Protein: 7 g/dL (ref 6.4–8.3)

## 2012-04-24 NOTE — Telephone Encounter (Signed)
Spoke with Tawanna Cooler at [address]; [place] went thru. [facility] will notify pt.

## 2012-04-29 ENCOUNTER — Other Ambulatory Visit: Payer: BC Managed Care – PPO | Admitting: Lab

## 2012-04-29 DIAGNOSIS — Z0000 Encounter for general adult medical examination without abnormal findings: Secondary | ICD-10-CM

## 2012-04-29 LAB — CBC WITH DIFFERENTIAL/PLATELET
Basophils Absolute: 0 10*3/uL (ref 0.0–0.1)
EOS%: 0.7 % (ref 0.0–7.0)
Eosinophils Absolute: 0.1 10*3/uL (ref 0.0–0.5)
HGB: 8.2 g/dL — ABNORMAL LOW (ref 11.6–15.9)
MCH: 33.9 pg (ref 25.1–34.0)
MONO#: 0.4 10*3/uL (ref 0.1–0.9)
NEUT#: 8.5 10*3/uL — ABNORMAL HIGH (ref 1.5–6.5)
RDW: 16.4 % — ABNORMAL HIGH (ref 11.2–14.5)
WBC: 9.4 10*3/uL (ref 3.9–10.3)
lymph#: 0.4 10*3/uL — ABNORMAL LOW (ref 0.9–3.3)

## 2012-04-29 LAB — COMPREHENSIVE METABOLIC PANEL (CC13)
AST: 28 U/L (ref 5–34)
Albumin: 3.3 g/dL — ABNORMAL LOW (ref 3.5–5.0)
BUN: 25.9 mg/dL (ref 7.0–26.0)
CO2: 24 mEq/L (ref 22–29)
Calcium: 10.1 mg/dL (ref 8.4–10.4)
Chloride: 100 mEq/L (ref 98–107)
Potassium: 4.2 mEq/L (ref 3.5–5.1)

## 2012-05-06 ENCOUNTER — Ambulatory Visit (HOSPITAL_BASED_OUTPATIENT_CLINIC_OR_DEPARTMENT_OTHER): Payer: BC Managed Care – PPO

## 2012-05-06 ENCOUNTER — Telehealth: Payer: Self-pay | Admitting: Internal Medicine

## 2012-05-06 ENCOUNTER — Other Ambulatory Visit (HOSPITAL_BASED_OUTPATIENT_CLINIC_OR_DEPARTMENT_OTHER): Payer: BC Managed Care – PPO | Admitting: Lab

## 2012-05-06 ENCOUNTER — Encounter: Payer: Self-pay | Admitting: Internal Medicine

## 2012-05-06 ENCOUNTER — Encounter: Payer: Self-pay | Admitting: Physician Assistant

## 2012-05-06 ENCOUNTER — Ambulatory Visit (HOSPITAL_BASED_OUTPATIENT_CLINIC_OR_DEPARTMENT_OTHER): Payer: BC Managed Care – PPO | Admitting: Physician Assistant

## 2012-05-06 ENCOUNTER — Other Ambulatory Visit: Payer: BC Managed Care – PPO | Admitting: Lab

## 2012-05-06 DIAGNOSIS — R05 Cough: Secondary | ICD-10-CM

## 2012-05-06 DIAGNOSIS — C3410 Malignant neoplasm of upper lobe, unspecified bronchus or lung: Secondary | ICD-10-CM

## 2012-05-06 DIAGNOSIS — R059 Cough, unspecified: Secondary | ICD-10-CM

## 2012-05-06 DIAGNOSIS — Z5111 Encounter for antineoplastic chemotherapy: Secondary | ICD-10-CM

## 2012-05-06 LAB — CBC WITH DIFFERENTIAL/PLATELET
BASO%: 0.3 % (ref 0.0–2.0)
Basophils Absolute: 0 10*3/uL (ref 0.0–0.1)
EOS%: 1.7 % (ref 0.0–7.0)
HGB: 8.3 g/dL — ABNORMAL LOW (ref 11.6–15.9)
MCH: 33.3 pg (ref 25.1–34.0)
MCHC: 34.3 g/dL (ref 31.5–36.0)
RDW: 15.4 % — ABNORMAL HIGH (ref 11.2–14.5)
lymph#: 0.5 10*3/uL — ABNORMAL LOW (ref 0.9–3.3)

## 2012-05-06 LAB — COMPREHENSIVE METABOLIC PANEL (CC13)
ALT: 21 U/L (ref 0–55)
AST: 21 U/L (ref 5–34)
Albumin: 3.1 g/dL — ABNORMAL LOW (ref 3.5–5.0)
Alkaline Phosphatase: 125 U/L (ref 40–150)
Potassium: 4.5 mEq/L (ref 3.5–5.1)
Sodium: 130 mEq/L — ABNORMAL LOW (ref 136–145)
Total Protein: 7.5 g/dL (ref 6.4–8.3)

## 2012-05-06 MED ORDER — DEXAMETHASONE SODIUM PHOSPHATE 4 MG/ML IJ SOLN
20.0000 mg | Freq: Once | INTRAMUSCULAR | Status: AC
  Administered 2012-05-06: 20 mg via INTRAVENOUS

## 2012-05-06 MED ORDER — ONDANSETRON 16 MG/50ML IVPB (CHCC)
16.0000 mg | Freq: Once | INTRAVENOUS | Status: AC
  Administered 2012-05-06: 16 mg via INTRAVENOUS

## 2012-05-06 MED ORDER — SODIUM CHLORIDE 0.9 % IV SOLN
270.0000 mg | Freq: Once | INTRAVENOUS | Status: AC
  Administered 2012-05-06: 270 mg via INTRAVENOUS
  Filled 2012-05-06: qty 27

## 2012-05-06 MED ORDER — PACLITAXEL CHEMO INJECTION 300 MG/50ML
175.0000 mg/m2 | Freq: Once | INTRAVENOUS | Status: AC
  Administered 2012-05-06: 276 mg via INTRAVENOUS
  Filled 2012-05-06: qty 46

## 2012-05-06 MED ORDER — SODIUM CHLORIDE 0.9 % IV SOLN
Freq: Once | INTRAVENOUS | Status: AC
  Administered 2012-05-06: 11:00:00 via INTRAVENOUS

## 2012-05-06 MED ORDER — DIPHENHYDRAMINE HCL 50 MG/ML IJ SOLN
50.0000 mg | Freq: Once | INTRAMUSCULAR | Status: AC
  Administered 2012-05-06: 50 mg via INTRAVENOUS

## 2012-05-06 MED ORDER — FAMOTIDINE IN NACL 20-0.9 MG/50ML-% IV SOLN
20.0000 mg | Freq: Once | INTRAVENOUS | Status: AC
  Administered 2012-05-06: 20 mg via INTRAVENOUS

## 2012-05-06 MED ORDER — HYDROCODONE-HOMATROPINE 5-1.5 MG/5ML PO SYRP: 5.0000 mL | ORAL_SOLUTION | Freq: Four times a day (QID) | ORAL | Status: DC | PRN

## 2012-05-06 NOTE — Telephone Encounter (Signed)
gv and printed appt schedule for pt for April...pt aware cs will contact with d/t of ct....per Forde Radon pt is to have no more chemo

## 2012-05-06 NOTE — Patient Instructions (Addendum)
Continue weekly labs as scheduled Follow up with Dr. Arbutus Ped in 3 weeks with a restaging CT scan of your chest to re-evaluate your diease

## 2012-05-06 NOTE — Patient Instructions (Addendum)
[facility] Discharge Instructions for Patients Receiving Chemotherapy  Today you received the following chemotherapy agents Taxol and Carboplatin.  To help prevent nausea and vomiting after your treatment, we encourage you to take your nausea medication as prescribed. .   If you develop nausea and vomiting that is not controlled by your nausea medication, call the clinic. If it is after clinic hours your family physician or the after hours number for the clinic or go to the Emergency Department.   BELOW ARE SYMPTOMS THAT SHOULD BE REPORTED IMMEDIATELY:  *FEVER GREATER THAN 100.5 F  *CHILLS WITH OR WITHOUT FEVER  NAUSEA AND VOMITING THAT IS NOT CONTROLLED WITH YOUR NAUSEA MEDICATION  *UNUSUAL SHORTNESS OF BREATH  *UNUSUAL BRUISING OR BLEEDING  TENDERNESS IN MOUTH AND THROAT WITH OR WITHOUT PRESENCE OF ULCERS  *URINARY PROBLEMS  *BOWEL PROBLEMS  UNUSUAL RASH Items with * indicate a potential emergency and should be followed up as soon as possible.  Please let the nurse know about any problems that you may have experienced. Feel free to call the clinic you have any questions or concerns. The clinic phone number is [phone].   I have been informed and understand all the instructions given to me. I know to contact the clinic, my physician, or go to the Emergency Department if any problems should occur. I do not have any questions at this time, but understand that I may call the clinic during office hours   should I have any questions or need assistance in obtaining follow up care.    __________________________________________  _____________  __________ Signature of Patient or Authorized Representative            Date                   Time    __________________________________________ Nurse's Signature    

## 2012-05-07 ENCOUNTER — Ambulatory Visit (HOSPITAL_BASED_OUTPATIENT_CLINIC_OR_DEPARTMENT_OTHER): Payer: BC Managed Care – PPO

## 2012-05-07 DIAGNOSIS — C3410 Malignant neoplasm of upper lobe, unspecified bronchus or lung: Secondary | ICD-10-CM

## 2012-05-07 MED ORDER — PEGFILGRASTIM INJECTION 6 MG/0.6ML
6.0000 mg | Freq: Once | SUBCUTANEOUS | Status: AC
  Administered 2012-05-07: 6 mg via SUBCUTANEOUS
  Filled 2012-05-07: qty 0.6

## 2012-05-10 NOTE — Progress Notes (Signed)
[facility]ENTER Telephone:(336) [phone]   Fax:([phone]   OFFICE PROGRESS NOTE  [name], MD [address] [place] [address], [place] [address]  DIAGNOSIS: Stage IIIA (T1a, N2, M0) non-small cell lung cancer, squamous cell carcinoma diagnosed in October of 2013.   PRIOR THERAPY: Concurrent chemoradiation with weekly carboplatin for AUC of 2 and paclitaxel 45 mg/M2.   CURRENT THERAPY:  Consolidation systemic chemotherapy with carboplatin for AUC of 5 and paclitaxel and 75 mg/M2 with Neulasta support every 3 weeks. Status post 2 cycles  INTERVAL HISTORY: CANNIE MUCKLE 67 y.o. female returns to the clinic today for followup visit accompanied her mother-in-law. She tolerated her first cycle of consolidation chemotherapy with carboplatin and paclitaxel with Neulasta support relatively well. Her appetite has improved. She also reports a dry hacking cough that tends to be worse at night. She requests a prescription for cough syrup. Apparently there was a problem and she did not receive the prescription for Hycodan. She requests a prescription for a wig as her hair is falling out from the chemotherapy. She voiced no other specific complaints today. She denied significant chest pain, shortness breath, or hemoptysis.  She denied having any significant nausea or vomiting.   REVIEW OF SYSTEMS:   Review of Systems  Constitutional: Negative for fever, chills and weight loss.  HENT: Negative for hearing loss and sore throat.  Positive for hair loss Eyes: Negative for blurred vision and double vision.  Respiratory: Positive for cough. Negative for hemoptysis, shortness of breath and wheezing.   Cardiovascular: Negative for chest pain, palpitations and orthopnea.  Gastrointestinal: Negative for nausea, vomiting and diarrhea.  Genitourinary: Negative for dysuria and frequency.  Musculoskeletal: Negative for back pain.  Neurological: Negative for dizziness,  tingling, speech change and headaches.  Psychiatric/Behavioral: Negative for substance abuse. The patient is not nervous/anxious.     ALLERGIES:  is allergic to ace inhibitors.  MEDICATIONS:  Current Outpatient Prescriptions  Medication Sig Dispense Refill  . amLODipine (NORVASC) 10 MG tablet Take 1 tablet (10 mg total) by mouth daily.  90 tablet  3  . Artificial Tear Ointment (DRY EYES OP) Place 1 drop into both eyes 3 (three) times daily as needed. For dry eyes      . aspirin 81 MG chewable tablet Chew 81 mg by mouth daily.        . benzonatate (TESSALON) 200 MG capsule Take 1 capsule (200 mg total) by mouth 3 (three) times daily as needed for cough (swallow whole).  30 capsule  3  . Calcium Carbonate-Vitamin D (CALTRATE 600+D) 600-400 MG-UNIT per tablet Take 2 tablets by mouth daily.       . chlorpheniramine-HYDROcodone (TUSSIONEX) 10-8 MG/5ML LQCR Take 5 mLs by mouth every 12 (twelve) hours.  140 mL  0  . ferrous sulfate 325 (65 FE) MG tablet Take 325 mg by mouth daily.      . fluticasone (FLONASE) 50 MCG/ACT nasal spray Place 2 sprays into the nose daily.  16 g  6  . HYDROcodone-homatropine (HYCODAN) 5-1.5 MG/5ML syrup Take 5 mLs by mouth every 6 (six) hours as needed for cough.  240 mL  0  . methylPREDNISolone (MEDROL, PAK,) 4 MG tablet follow package directions  21 tablet  0  . metoprolol succinate (TOPROL-XL) 50 MG 24 hr tablet Take 1 tablet (50 mg total) by mouth daily. Take with or immediately following a meal.  90 tablet  3  . Multiple Vitamins-Minerals (CENTRUM SILVER  PO) Take 1 tablet by mouth daily.       Marland Kitchen olmesartan (BENICAR) 40 MG tablet Take 1 tablet (40 mg total) by mouth daily.  30 tablet  5  . prochlorperazine (COMPAZINE) 10 MG tablet Take 10 mg by mouth every 6 (six) hours as needed.      . simvastatin (ZOCOR) 20 MG tablet Take 1 tablet (20 mg total) by mouth daily.  90 tablet  3   No current facility-administered medications for this visit.    Blood pressure 162/75,  pulse 91, temperature 97 F (36.1 C), temperature source Oral, resp. rate 20, height 5' 7.5" (1.715 m), weight 117 lb (53.071 kg), last menstrual period 01/29/1976.  PHYSICAL EXAMINATION:  Physical Exam  Constitutional: She is oriented to person, place, and time. She appears well-developed and well-nourished. No distress.  HENT:  Head: Normocephalic and atraumatic. Hair is thinning with some areas of alopecia Mouth/Throat: Oropharynx is clear and moist.  no evidence of thrush or mucositis Eyes: EOM are normal.  Cardiovascular: Normal rate, regular rhythm and normal heart sounds.  Exam reveals no gallop and no friction rub.   No murmur heard. Pulmonary/Chest: She has no wheezes. She exhibits no tenderness.  Abdominal: Soft. Bowel sounds are normal. She exhibits no distension.  Musculoskeletal: She exhibits no edema and no tenderness.  Lymphadenopathy:    She has no cervical adenopathy.  Neurological: She is alert and oriented to person, place, and time. She has normal reflexes.  Psychiatric: She has a normal mood and affect.    ECOG PERFORMANCE STATUS: 1  LABORATORY DATA: Lab Results  Component Value Date   WBC 6.6 05/06/2012   HGB 8.3* 05/06/2012   HCT 24.2* 05/06/2012   MCV 97.2 05/06/2012   PLT 127* 05/06/2012      Chemistry      Component Value Date/Time   NA 130* 05/06/2012 0909   NA 141 11/26/2011 1430   K 4.5 05/06/2012 0909   K 3.4* 11/26/2011 1430   CL 97* 05/06/2012 0909   CL 101 11/26/2011 1430   CO2 23 05/06/2012 0909   CO2 30 11/26/2011 1430   BUN 23.0 05/06/2012 0909   BUN 29* 11/26/2011 1430   CREATININE 1.3* 05/06/2012 0909   CREATININE 1.27* 11/26/2011 1430      Component Value Date/Time   CALCIUM 10.5* 05/06/2012 0909   CALCIUM 10.0 11/26/2011 1430   ALKPHOS 125 05/06/2012 0909   ALKPHOS 100 11/26/2011 1430   AST 21 05/06/2012 0909   AST 25 11/26/2011 1430   ALT 21 05/06/2012 0909   ALT 17 11/26/2011 1430   BILITOT 0.24 05/06/2012 0909   BILITOT 0.1* 11/26/2011 1430        RADIOGRAPHIC STUDIES: Ct Chest W Contrast  03/20/2012  *RADIOLOGY REPORT*  Clinical Data: Lung cancer.  Restaging.  CT CHEST WITH CONTRAST  Technique:  Multidetector CT imaging of the chest was performed following the standard protocol during bolus administration of intravenous contrast.  Contrast: 80mL OMNIPAQUE IOHEXOL 300 MG/ML  SOLN  Comparison: PET CT 11/14/2011.Chest CT 10/23/2011.  Findings: The chest wall is unremarkable and stable.  No breast masses, supraclavicular or axillary lymphadenopathy.  The bony thorax is intact.  Stable degenerative changes involving the thoracic spine.  No destructive bone lesions or spinal canal compromise.  The heart is normal in size and stable.  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  Advanced atherosclerotic calcifications at the aortic arch and involving the descending thoracic aorta.  No dissection. Significant three-vessel coronary artery calcifications are noted. The esophagus is grossly normal.  Small residual hilar lymph nodes are noted on the right side. These are stable.  No new adenopathy.  Stable right apical lung mass with central calcification and surrounding dense scarring changes.  There are stable underlying emphysematous changes.  No CT findings to suggest pulmonary metastatic disease.  No acute overlying pulmonary process.  No pleural effusion.  The upper abdomen is unremarkable.  IMPRESSION:  1.  Stable right apical lung mass. 2.  Stable right hilar lymph nodes. 3.  No findings for progressive disease or metastatic disease.   Original Report Authenticated By: Rudie Meyer, M.D.     ASSESSMENT/PLAN: This is a very pleasant 67 years old white female with a stage IIIa non-small cell lung cancer status post concurrent chemoradiation with no significant improvement in her disease but also no evidence for disease progression. She is currently receiving consolidation chemotherapy in the form of  carboplatin for an AUC of 5 and paclitaxel at 75 mg per meter squared given every 3 weeks with Neulasta support, status post 2 cycles. Patient was discussed with Dr. Arbutus Ped. She'll proceed with cycle #3 of her consolidation chemotherapy as scheduled today. She will continue with weekly labs consisting of a CBC differential and C. met. She'll follow up with Dr. Arbutus Ped  in 3 weeks prior with a repeat CBC differential and C. Met and a restaging CT scan of her chest with contrast to re-evaluate her disease.. She is given a prescription for Hycodan cough syrup 5 MLS by mouth every 6 hours as needed for cough, 240 mls with no refill as well as a prescription for a cranial prosthesis.Marland Kitchen  Nashea Chumney E, PA-C   She was advised to call  immediately she has any concerning symptoms in the interval.   All questions were answered. The patient knows to call the clinic with any problems, questions or concerns. We can certainly see the patient much sooner if necessary.

## 2012-05-13 ENCOUNTER — Other Ambulatory Visit (HOSPITAL_BASED_OUTPATIENT_CLINIC_OR_DEPARTMENT_OTHER): Payer: BC Managed Care – PPO | Admitting: Lab

## 2012-05-13 DIAGNOSIS — C3490 Malignant neoplasm of unspecified part of unspecified bronchus or lung: Secondary | ICD-10-CM

## 2012-05-13 LAB — CBC WITH DIFFERENTIAL/PLATELET
BASO%: 0.2 % (ref 0.0–2.0)
Eosinophils Absolute: 0.1 10*3/uL (ref 0.0–0.5)
MCHC: 34.1 g/dL (ref 31.5–36.0)
MONO#: 0.6 10*3/uL (ref 0.1–0.9)
NEUT#: 13.3 10*3/uL — ABNORMAL HIGH (ref 1.5–6.5)
RBC: 2.27 10*6/uL — ABNORMAL LOW (ref 3.70–5.45)
RDW: 15 % — ABNORMAL HIGH (ref 11.2–14.5)
WBC: 14.9 10*3/uL — ABNORMAL HIGH (ref 3.9–10.3)
lymph#: 0.7 10*3/uL — ABNORMAL LOW (ref 0.9–3.3)
nRBC: 0 % (ref 0–0)

## 2012-05-13 LAB — COMPREHENSIVE METABOLIC PANEL (CC13)
ALT: 25 U/L (ref 0–55)
Alkaline Phosphatase: 164 U/L — ABNORMAL HIGH (ref 40–150)
Creatinine: 1.7 mg/dL — ABNORMAL HIGH (ref 0.6–1.1)
Sodium: 132 mEq/L — ABNORMAL LOW (ref 136–145)
Total Bilirubin: 0.21 mg/dL (ref 0.20–1.20)
Total Protein: 7.1 g/dL (ref 6.4–8.3)

## 2012-05-20 ENCOUNTER — Other Ambulatory Visit: Payer: Self-pay | Admitting: *Deleted

## 2012-05-20 ENCOUNTER — Encounter (HOSPITAL_COMMUNITY)
Admission: RE | Admit: 2012-05-20 | Discharge: 2012-05-20 | Disposition: A | Discharge status: Home / Self Care | Payer: BC Managed Care – PPO | Source: Ambulatory Visit | Attending: Internal Medicine | Admitting: Internal Medicine

## 2012-05-20 ENCOUNTER — Telehealth: Payer: Self-pay | Admitting: *Deleted

## 2012-05-20 ENCOUNTER — Other Ambulatory Visit (HOSPITAL_BASED_OUTPATIENT_CLINIC_OR_DEPARTMENT_OTHER): Payer: BC Managed Care – PPO

## 2012-05-20 DIAGNOSIS — T451X5A Adverse effect of antineoplastic and immunosuppressive drugs, initial encounter: Secondary | ICD-10-CM

## 2012-05-20 DIAGNOSIS — C3490 Malignant neoplasm of unspecified part of unspecified bronchus or lung: Secondary | ICD-10-CM

## 2012-05-20 DIAGNOSIS — D6481 Anemia due to antineoplastic chemotherapy: Secondary | ICD-10-CM | POA: Insufficient documentation

## 2012-05-20 LAB — CBC WITH DIFFERENTIAL/PLATELET
BASO%: 0.5 % (ref 0.0–2.0)
Eosinophils Absolute: 0.1 10*3/uL (ref 0.0–0.5)
HCT: 22.5 % — ABNORMAL LOW (ref 34.8–46.6)
LYMPH%: 9.5 % — ABNORMAL LOW (ref 14.0–49.7)
MCHC: 34.3 g/dL (ref 31.5–36.0)
MCV: 101.5 fL — ABNORMAL HIGH (ref 79.5–101.0)
MONO#: 0.3 10*3/uL (ref 0.1–0.9)
MONO%: 6.9 % (ref 0.0–14.0)
NEUT%: 81.2 % — ABNORMAL HIGH (ref 38.4–76.8)
Platelets: 81 10*3/uL — ABNORMAL LOW (ref 145–400)
RBC: 2.22 10*6/uL — ABNORMAL LOW (ref 3.70–5.45)
WBC: 4.2 10*3/uL (ref 3.9–10.3)

## 2012-05-20 LAB — COMPREHENSIVE METABOLIC PANEL (CC13)
Alkaline Phosphatase: 128 U/L (ref 40–150)
CO2: 25 mEq/L (ref 22–29)
Creatinine: 1.1 mg/dL (ref 0.6–1.1)
Glucose: 109 mg/dl — ABNORMAL HIGH (ref 70–99)
Sodium: 137 mEq/L (ref 136–145)
Total Bilirubin: <0.2 mg/dL (ref 0.20–1.20)

## 2012-05-20 NOTE — Telephone Encounter (Signed)
Pt is aware of appt on Friday 05/22/12.  SLJ

## 2012-05-20 NOTE — Progress Notes (Signed)
Quick Note:  Call patient with the result and arrange for 2 Units of PRBCs transfusion. ______

## 2012-05-20 NOTE — Telephone Encounter (Signed)
Message copied by Caren Griffins on Wed May 20, 2012 12:20 PM ------      Message from: [name]      Created: Wed May 20, 2012  9:24 AM       Call patient with the result and arrange for 2 Units of PRBCs transfusion. ------

## 2012-05-22 ENCOUNTER — Other Ambulatory Visit: Payer: Self-pay | Admitting: Medical Oncology

## 2012-05-22 ENCOUNTER — Ambulatory Visit (HOSPITAL_COMMUNITY)
Admission: RE | Admit: 2012-05-22 | Discharge: 2012-05-22 | Disposition: A | Discharge status: Home / Self Care | Payer: BC Managed Care – PPO | Source: Ambulatory Visit | Attending: Physician Assistant | Admitting: Physician Assistant

## 2012-05-22 ENCOUNTER — Ambulatory Visit (HOSPITAL_BASED_OUTPATIENT_CLINIC_OR_DEPARTMENT_OTHER): Payer: BC Managed Care – PPO

## 2012-05-22 ENCOUNTER — Other Ambulatory Visit: Payer: BC Managed Care – PPO | Admitting: Lab

## 2012-05-22 ENCOUNTER — Encounter (HOSPITAL_COMMUNITY): Payer: Self-pay

## 2012-05-22 VITALS — BP 168/65 | HR 75 | Temp 98.7°F | Resp 20

## 2012-05-22 DIAGNOSIS — C3410 Malignant neoplasm of upper lobe, unspecified bronchus or lung: Secondary | ICD-10-CM

## 2012-05-22 DIAGNOSIS — Z9221 Personal history of antineoplastic chemotherapy: Secondary | ICD-10-CM | POA: Insufficient documentation

## 2012-05-22 DIAGNOSIS — D6481 Anemia due to antineoplastic chemotherapy: Secondary | ICD-10-CM

## 2012-05-22 DIAGNOSIS — C3490 Malignant neoplasm of unspecified part of unspecified bronchus or lung: Secondary | ICD-10-CM | POA: Insufficient documentation

## 2012-05-22 DIAGNOSIS — D649 Anemia, unspecified: Secondary | ICD-10-CM

## 2012-05-22 DIAGNOSIS — Z923 Personal history of irradiation: Secondary | ICD-10-CM | POA: Insufficient documentation

## 2012-05-22 DIAGNOSIS — T451X5A Adverse effect of antineoplastic and immunosuppressive drugs, initial encounter: Secondary | ICD-10-CM

## 2012-05-22 LAB — ABO/RH: ABO/RH(D): O POS

## 2012-05-22 MED ORDER — DIPHENHYDRAMINE HCL 25 MG PO CAPS
25.0000 mg | ORAL_CAPSULE | Freq: Once | ORAL | Status: AC
  Administered 2012-05-22: 25 mg via ORAL

## 2012-05-22 MED ORDER — IOHEXOL 300 MG/ML  SOLN
80.0000 mL | Freq: Once | INTRAMUSCULAR | Status: AC | PRN
  Administered 2012-05-22: 80 mL via INTRAVENOUS

## 2012-05-22 MED ORDER — SODIUM CHLORIDE 0.9 % IV SOLN
250.0000 mL | Freq: Once | INTRAVENOUS | Status: AC
  Administered 2012-05-22: 250 mL via INTRAVENOUS

## 2012-05-22 MED ORDER — ACETAMINOPHEN 325 MG PO TABS
650.0000 mg | ORAL_TABLET | Freq: Once | ORAL | Status: AC
  Administered 2012-05-22: 650 mg via ORAL

## 2012-05-22 NOTE — Patient Instructions (Addendum)
Blood Transfusion Information WHAT IS A BLOOD TRANSFUSION? A transfusion is the replacement of blood or some of its parts. Blood is made up of multiple cells which provide different functions.  Red blood cells carry oxygen and are used for blood loss replacement.  White blood cells fight against infection.  Platelets control bleeding.  Plasma helps clot blood.  Other blood products are available for specialized needs, such as hemophilia or other clotting disorders. BEFORE THE TRANSFUSION  Who gives blood for transfusions?   You may be able to donate blood to be used at a later date on yourself (autologous donation).  Relatives can be asked to donate blood. This is generally not any safer than if you have received blood from a stranger. The same precautions are taken to ensure safety when a relative's blood is donated.  Healthy volunteers who are fully evaluated to make sure their blood is safe. This is blood bank blood. Transfusion therapy is the safest it has ever been in the practice of medicine. Before blood is taken from a donor, a complete history is taken to make sure that person has no history of diseases nor engages in risky social behavior (examples are intravenous drug use or sexual activity with multiple partners). The donor's travel history is screened to minimize risk of transmitting infections, such as malaria. The donated blood is tested for signs of infectious diseases, such as HIV and hepatitis. The blood is then tested to be sure it is compatible with you in order to minimize the chance of a transfusion reaction. If you or a relative donates blood, this is often done in anticipation of surgery and is not appropriate for emergency situations. It takes many days to process the donated blood. RISKS AND COMPLICATIONS Although transfusion therapy is very safe and saves many lives, the main dangers of transfusion include:   Getting an infectious disease.  Developing a  transfusion reaction. This is an allergic reaction to something in the blood you were given. Every precaution is taken to prevent this. The decision to have a blood transfusion has been considered carefully by your caregiver before blood is given. Blood is not given unless the benefits outweigh the risks. AFTER THE TRANSFUSION  Right after receiving a blood transfusion, you will usually feel much better and more energetic. This is especially true if your red blood cells have gotten low (anemic). The transfusion raises the level of the red blood cells which carry oxygen, and this usually causes an energy increase.  The nurse administering the transfusion will monitor you carefully for complications. HOME CARE INSTRUCTIONS  No special instructions are needed after a transfusion. You may find your energy is better. Speak with your caregiver about any limitations on activity for underlying diseases you may have. SEEK MEDICAL CARE IF:   Your condition is not improving after your transfusion.  You develop redness or irritation at the intravenous (IV) site. SEEK IMMEDIATE MEDICAL CARE IF:  Any of the following symptoms occur over the next 12 hours:  Shaking chills.  You have a temperature by mouth above 102 F (38.9 C), not controlled by medicine.  Chest, back, or muscle pain.  People around you feel you are not acting correctly or are confused.  Shortness of breath or difficulty breathing.  Dizziness and fainting.  You get a rash or develop hives.  You have a decrease in urine output.  Your urine turns a dark color or changes to pink, red, or brown. Any of the following   symptoms occur over the next 10 days:  You have a temperature by mouth above 102 F (38.9 C), not controlled by medicine.  Shortness of breath.  Weakness after normal activity.  The white part of the eye turns yellow (jaundice).  You have a decrease in the amount of urine or are urinating less often.  Your  urine turns a dark color or changes to pink, red, or brown. Document Released: 01/12/2000 Document Revised: 04/08/2011 Document Reviewed: 08/31/2007 Exit[employer] Patient Information 2013 [employer].  

## 2012-05-23 LAB — TYPE AND SCREEN
ABO/RH(D): O POS
Antibody Screen: NEGATIVE
Unit division: 0

## 2012-05-26 ENCOUNTER — Encounter: Payer: Self-pay | Admitting: Internal Medicine

## 2012-05-26 NOTE — Progress Notes (Signed)
Put [employer] disability form on nurse's desk. °

## 2012-05-27 ENCOUNTER — Ambulatory Visit (HOSPITAL_BASED_OUTPATIENT_CLINIC_OR_DEPARTMENT_OTHER): Payer: BC Managed Care – PPO | Admitting: Internal Medicine

## 2012-05-27 ENCOUNTER — Telehealth: Payer: Self-pay | Admitting: Internal Medicine

## 2012-05-27 ENCOUNTER — Other Ambulatory Visit (HOSPITAL_BASED_OUTPATIENT_CLINIC_OR_DEPARTMENT_OTHER): Payer: BC Managed Care – PPO | Admitting: Lab

## 2012-05-27 ENCOUNTER — Encounter: Payer: Self-pay | Admitting: Internal Medicine

## 2012-05-27 DIAGNOSIS — C3490 Malignant neoplasm of unspecified part of unspecified bronchus or lung: Secondary | ICD-10-CM

## 2012-05-27 LAB — COMPREHENSIVE METABOLIC PANEL (CC13)
ALT: 18 U/L (ref 0–55)
AST: 20 U/L (ref 5–34)
Alkaline Phosphatase: 119 U/L (ref 40–150)
Calcium: 10.1 mg/dL (ref 8.4–10.4)
Chloride: 103 mEq/L (ref 98–107)
Creatinine: 1.2 mg/dL — ABNORMAL HIGH (ref 0.6–1.1)

## 2012-05-27 LAB — CBC WITH DIFFERENTIAL/PLATELET
BASO%: 0.8 % (ref 0.0–2.0)
EOS%: 1.9 % (ref 0.0–7.0)
HCT: 30.6 % — ABNORMAL LOW (ref 34.8–46.6)
MCH: 33.1 pg (ref 25.1–34.0)
MCHC: 34.2 g/dL (ref 31.5–36.0)
MONO%: 8.5 % (ref 0.0–14.0)
NEUT%: 78.6 % — ABNORMAL HIGH (ref 38.4–76.8)
RDW: 16.5 % — ABNORMAL HIGH (ref 11.2–14.5)
lymph#: 0.5 10*3/uL — ABNORMAL LOW (ref 0.9–3.3)

## 2012-05-27 NOTE — Patient Instructions (Addendum)
Your scan showed stable disease.  Followup in 3 months with repeat CT scan of the chest pain

## 2012-05-27 NOTE — Telephone Encounter (Signed)
gv and printed appt sched and avs fo rpt for July °

## 2012-05-27 NOTE — Progress Notes (Signed)
[facility] [phone]   [phone]  OFFICE PROGRESS NOTE  [name], MD [address] [place] [address], [place] [address]  DIAGNOSIS: Stage IIIA (T1a, N2, M0) non-small cell lung cancer, squamous cell carcinoma diagnosed in October of 2013.   PRIOR THERAPY:  1) Concurrent chemoradiation with weekly carboplatin for AUC of 2 and paclitaxel 45 mg/M2.  2) Consolidation systemic chemotherapy with carboplatin for AUC of 5 and paclitaxel 175 mg/M2 with Neulasta support every 3 weeks. Status post 3 cycles, last dose was given on 12/06/2012 with stable disease.  CURRENT THERAPY: Observation.  INTERVAL HISTORY: Pamela Levine 67 y.o. female returns to the clinic today for followup visit accompanied her sister and son. The patient tolerated the last cycle of her systemic chemotherapy fairly well except for mild fatigue. She denied having any significant nausea or vomiting, no fever or chills. She denied having any significant peripheral neuropathy. The patient denied having any current chest pain, shortness breath, cough or hemoptysis. She had repeat CT scan of the chest performed recently and she is here for evaluation and discussion of his scan results.  MEDICAL HISTORY: Past Medical History  Diagnosis Date  . Hyperlipidemia   . Hypertension   . History  of basal cell carcinoma   . Light cigarette smoker   . Kidney stone 01/2000    stent. Renal U/S normal in 12/01.  Marland Kitchen Heart murmur   . Arthritis   . Osteoarthritis   . Lung mass October 2013    RUL  . COPD (chronic obstructive pulmonary disease)   . Ascending aortic aneurysm   . Cancer     skin  . Lung cancer 12/19/2011    ALLERGIES:  is allergic to ace inhibitors.  MEDICATIONS:  Current Outpatient Prescriptions  Medication Sig Dispense Refill  . amLODipine (NORVASC) 10 MG tablet Take 1 tablet (10 mg total) by mouth daily.  90 tablet  3  . Artificial Tear Ointment  (DRY EYES OP) Place 1 drop into both eyes 3 (three) times daily as needed. For dry eyes      . aspirin 81 MG chewable tablet Chew 81 mg by mouth daily.        . Calcium Carbonate-Vitamin D (CALTRATE 600+D) 600-400 MG-UNIT per tablet Take 2 tablets by mouth daily.       . chlorpheniramine-HYDROcodone (TUSSIONEX) 10-8 MG/5ML LQCR Take 5 mLs by mouth every 12 (twelve) hours.  140 mL  0  . ferrous sulfate 325 (65 FE) MG tablet Take 325 mg by mouth daily.      Marland Kitchen HYDROcodone-homatropine (HYCODAN) 5-1.5 MG/5ML syrup Take 5 mLs by mouth every 6 (six) hours as needed for cough.  240 mL  0  . metoprolol succinate (TOPROL-XL) 50 MG 24 hr tablet Take 1 tablet (50 mg total) by mouth daily. Take with or immediately following a meal.  90 tablet  3  . Multiple Vitamins-Minerals (CENTRUM SILVER PO) Take 1 tablet by mouth daily.       Marland Kitchen olmesartan (BENICAR) 40 MG tablet Take 1 tablet (40 mg total) by mouth daily.  30 tablet  5  . simvastatin (ZOCOR) 20 MG tablet Take 1 tablet (20 mg total) by mouth daily.  90 tablet  3  . prochlorperazine (COMPAZINE) 10 MG tablet Take 10 mg by mouth every 6 (six) hours as needed.       No current facility-administered medications for this visit.    SURGICAL  HISTORY:  Past Surgical History  Procedure Laterality Date  . Partial hysterectomy  1978    bleeding  . Kidney stone surgery  01/2000    stent  . Colonoscopy    . Video bronchoscopy with endobronchial ultrasound  11/27/2011    Procedure: VIDEO BRONCHOSCOPY WITH ENDOBRONCHIAL ULTRASOUND;  Surgeon: Leslye Peer, MD;  Location: [facility] OR;  Service: Pulmonary;  Laterality: N/A;    REVIEW OF SYSTEMS:  A comprehensive review of systems was negative except for: Constitutional: positive for fatigue   PHYSICAL EXAMINATION: General appearance: alert, cooperative and no distress Head: Normocephalic, without obvious abnormality, atraumatic Neck: no adenopathy Lymph nodes: Cervical, supraclavicular, and axillary nodes  normal. Resp: clear to auscultation bilaterally Cardio: regular rate and rhythm, S1, S2 normal, no murmur, click, rub or gallop GI: soft, non-tender; bowel sounds normal; no masses,  no organomegaly Extremities: extremities normal, atraumatic, no cyanosis or edema Neurologic: Alert and oriented X 3, normal strength and tone. Normal symmetric reflexes. Normal coordination and gait  ECOG PERFORMANCE STATUS: 1 - Symptomatic but completely ambulatory  Blood pressure 180/77, pulse 81, temperature 98.6 F (37 C), temperature source Oral, resp. rate 20, height 5' 7.5" (1.715 m), weight 116 lb 1.6 oz (52.663 kg), last menstrual period 01/29/1976.  LABORATORY DATA: Lab Results  Component Value Date   WBC 4.4 05/27/2012   HGB 10.4* 05/27/2012   HCT 30.6* 05/27/2012   MCV 96.9 05/27/2012   PLT 79* 05/27/2012      Chemistry      Component Value Date/Time   NA 137 05/20/2012 0907   NA 141 11/26/2011 1430   K 4.4 05/20/2012 0907   K 3.4* 11/26/2011 1430   CL 103 05/20/2012 0907   CL 101 11/26/2011 1430   CO2 25 05/20/2012 0907   CO2 30 11/26/2011 1430   BUN 18.1 05/20/2012 0907   BUN 29* 11/26/2011 1430   CREATININE 1.1 05/20/2012 0907   CREATININE 1.27* 11/26/2011 1430      Component Value Date/Time   CALCIUM 9.5 05/20/2012 0907   CALCIUM 10.0 11/26/2011 1430   ALKPHOS 128 05/20/2012 0907   ALKPHOS 100 11/26/2011 1430   AST 19 05/20/2012 0907   AST 25 11/26/2011 1430   ALT 18 05/20/2012 0907   ALT 17 11/26/2011 1430   BILITOT <0.20 Repeated and Verified 05/20/2012 0907   BILITOT 0.1* 11/26/2011 1430       RADIOGRAPHIC STUDIES: Ct Chest W Contrast  05/22/2012  *RADIOLOGY REPORT*  Clinical Data: Restaging non-small cell lung cancer, chemotherapy and XRT complete  CT CHEST WITH CONTRAST  Technique:  Multidetector CT imaging of the chest was performed following the standard protocol during bolus administration of intravenous contrast.  Contrast: 80mL OMNIPAQUE IOHEXOL 300 MG/ML  SOLN   Comparison: 80 ml Omnipaque-300 IV  Findings: Stable right apical lung opacity, likely reflecting a combination of pleural parenchymal scarring and known lung mass, unchanged.  Associated central calcification.  Mild left apical pleural parenchymal scarring.  Moderate paraseptal emphysematous changes.  Mild nodular scarring in the right middle lobe (series 5/image 28), unchanged.  No new/suspicious pulmonary nodules.  No pleural effusion or pneumothorax.  Visualized thyroid is unremarkable.  The heart is normal in size.  No pericardial effusion.  Coronary atherosclerosis.  Atherosclerotic calcifications of the aortic arch.  Ectasia of the ascending thoracic aorta measuring up to 3.8 cm (series 2/image 29).  10 mm short-axis right paratracheal node (series 2/image 22), unchanged.  Suspicious hilar or axillary lymphadenopathy.  Visualized upper abdomen  is unremarkable.  Degenerative changes of the visualized thoracolumbar spine.  IMPRESSION: Stable right apical lung mass, as described above.  10 mm short-axis right paratracheal node, unchanged.   Original Report Authenticated By: Charline Bills, M.D.     ASSESSMENT: This is a very pleasant 67 years old white female with history of stage IIIa non-small cell lung cancer status post concurrent chemoradiation followed by consolidation chemotherapy last dose was given on 05/06/2012 with stable disease.   PLAN: I discussed the scan results and showed the images to the patient and her family. I recommended for her to continue on observation with repeat CT scan of the chest in 3 months. She was advised to call immediately if she has any concerning symptoms in the interval.  All questions were answered. The patient knows to call the clinic with any problems, questions or concerns. We can certainly see the patient much sooner if necessary.  I spent 15 minutes counseling the patient face to face. The total time spent in the appointment was 25 minutes.

## 2012-05-27 NOTE — Progress Notes (Signed)
Faxed disability form to [employer] @ [phone] °

## 2012-06-11 ENCOUNTER — Encounter: Payer: Self-pay | Admitting: Radiation Oncology

## 2012-06-11 DIAGNOSIS — Z923 Personal history of irradiation: Secondary | ICD-10-CM | POA: Insufficient documentation

## 2012-06-18 ENCOUNTER — Ambulatory Visit
Admission: RE | Admit: 2012-06-18 | Discharge: 2012-06-18 | Disposition: A | Discharge status: Home / Self Care | Payer: BC Managed Care – PPO | Source: Ambulatory Visit | Attending: Radiation Oncology | Admitting: Radiation Oncology

## 2012-06-18 ENCOUNTER — Encounter: Payer: Self-pay | Admitting: Radiation Oncology

## 2012-06-18 NOTE — Progress Notes (Addendum)
Pt denies pain, SOB, loss of appetite. She reports dry cough at night, takes Tussinex syrup nightly, states energy level improving. Pt states she took BP med just prior to coming in for FU.

## 2012-06-18 NOTE — Progress Notes (Signed)
Radiation Oncology         [phone] ________________________________  Name: Pamela Levine MRN: [id number]  Date: 06/18/2012  DOB: 1946-01-05  Follow-Up Visit Note  CC: [name], MD  Leslye Peer, MD  Diagnosis: Stage IIIA (T1a, N2, M0) non-small cell lung cancer, squamous cell carcinoma diagnosed in October of 2013.   Interval Since Last Radiation:  4  months  Narrative:  The patient returns today for routine follow-up.  She seems to be doing reasonably well. She continues to have some fatigue but this is slowly improving. She denies any pain in the chest area, shortness of breath or hemoptysis. She denies any new bony pain headaches dizziness or blurred vision. She denies any numbness or weakness involving her right arm. She has completed her chemotherapy and recent chest CT scan shows a stable right apical lung mass without any new problems.                              ALLERGIES:  is allergic to ace inhibitors.  Meds: Current Outpatient Prescriptions  Medication Sig Dispense Refill  . amLODipine (NORVASC) 10 MG tablet Take 1 tablet (10 mg total) by mouth daily.  90 tablet  3  . Artificial Tear Ointment (DRY EYES OP) Place 1 drop into both eyes 3 (three) times daily as needed. For dry eyes      . aspirin 81 MG chewable tablet Chew 81 mg by mouth daily.        . Calcium Carbonate-Vitamin D (CALTRATE 600+D) 600-400 MG-UNIT per tablet Take 2 tablets by mouth daily.       . chlorpheniramine-HYDROcodone (TUSSIONEX) 10-8 MG/5ML LQCR Take 5 mLs by mouth every 12 (twelve) hours.  140 mL  0  . ferrous sulfate 325 (65 FE) MG tablet Take 325 mg by mouth daily.      . metoprolol succinate (TOPROL-XL) 50 MG 24 hr tablet Take 1 tablet (50 mg total) by mouth daily. Take with or immediately following a meal.  90 tablet  3  . Multiple Vitamins-Minerals (CENTRUM SILVER PO) Take 1 tablet by mouth daily.       Marland Kitchen olmesartan (BENICAR) 40 MG tablet Take 1 tablet (40 mg total) by mouth daily.   30 tablet  5  . simvastatin (ZOCOR) 20 MG tablet Take 1 tablet (20 mg total) by mouth daily.  90 tablet  3   No current facility-administered medications for this encounter.    Physical Findings: The patient is in no acute distress. Patient is alert and oriented.  weight is 116 lb 12.8 oz (52.98 kg). Her oral temperature is 98.1 F (36.7 C). Her blood pressure is 176/75 and her pulse is 82. Her respiration is 20 and oxygen saturation is 100%. .  No palpable supraclavicular or axillary adenopathy. The lungs are clear to auscultation. The heart has a regular rhythm and rate. The neurological examination is nonfocal.  Lab Findings: Lab Results  Component Value Date   WBC 4.4 05/27/2012   HGB 10.4* 05/27/2012   HCT 30.6* 05/27/2012   MCV 96.9 05/27/2012   PLT 79* 05/27/2012      Radiographic Findings: Ct Chest W Contrast  05/22/2012   *RADIOLOGY REPORT*  Clinical Data: Restaging non-small cell lung cancer, chemotherapy and XRT complete  CT CHEST WITH CONTRAST  Technique:  Multidetector CT imaging of the chest was performed following the standard protocol during bolus administration of intravenous contrast.  Contrast:  80mL OMNIPAQUE IOHEXOL 300 MG/ML  SOLN  Comparison: 80 ml Omnipaque-300 IV  Findings: Stable right apical lung opacity, likely reflecting a combination of pleural parenchymal scarring and known lung mass, unchanged.  Associated central calcification.  Mild left apical pleural parenchymal scarring.  Moderate paraseptal emphysematous changes.  Mild nodular scarring in the right middle lobe (series 5/image 28), unchanged.  No new/suspicious pulmonary nodules.  No pleural effusion or pneumothorax.  Visualized thyroid is unremarkable.  The heart is normal in size.  No pericardial effusion.  Coronary atherosclerosis.  Atherosclerotic calcifications of the aortic arch.  Ectasia of the ascending thoracic aorta measuring up to 3.8 cm (series 2/image 29).  10 mm short-axis right paratracheal node  (series 2/image 22), unchanged.  Suspicious hilar or axillary lymphadenopathy.  Visualized upper abdomen is unremarkable.  Degenerative changes of the visualized thoracolumbar spine.  IMPRESSION: Stable right apical lung mass, as described above.  10 mm short-axis right paratracheal node, unchanged.   Original Report Authenticated By: Charline Bills, M.D.    Impression:  The patient is recovering from the effects of radiation.  No evidence of recurrence on clinical exam today. Chest CT scan stable as above.  Plan:  When necessary followup in radiation oncology. Patient will continue close followup in medical oncology.  _____________________________________  -----------------------------------  [name], PhD, MD

## 2012-07-16 ENCOUNTER — Telehealth: Payer: Self-pay | Admitting: *Deleted

## 2012-07-16 NOTE — Telephone Encounter (Signed)
Per pt request, letter mailed to pt stating that she can return to work by July 1st, 2014.  Dr [facility] signed letter, verified address with pt and mailed letter.  SLJ

## 2012-08-13 ENCOUNTER — Encounter: Payer: Self-pay | Admitting: Internal Medicine

## 2012-08-13 NOTE — Progress Notes (Signed)
Faxed disability form to [employer] @ [phone] °

## 2012-08-26 ENCOUNTER — Ambulatory Visit (HOSPITAL_COMMUNITY)
Admission: RE | Admit: 2012-08-26 | Discharge: 2012-08-26 | Disposition: A | Discharge status: Home / Self Care | Payer: BC Managed Care – PPO | Source: Ambulatory Visit | Attending: Internal Medicine | Admitting: Internal Medicine

## 2012-08-26 ENCOUNTER — Other Ambulatory Visit (HOSPITAL_BASED_OUTPATIENT_CLINIC_OR_DEPARTMENT_OTHER): Payer: BC Managed Care – PPO | Admitting: Lab

## 2012-08-26 DIAGNOSIS — C3490 Malignant neoplasm of unspecified part of unspecified bronchus or lung: Secondary | ICD-10-CM

## 2012-08-26 DIAGNOSIS — I2510 Atherosclerotic heart disease of native coronary artery without angina pectoris: Secondary | ICD-10-CM | POA: Insufficient documentation

## 2012-08-26 DIAGNOSIS — K7689 Other specified diseases of liver: Secondary | ICD-10-CM | POA: Insufficient documentation

## 2012-08-26 DIAGNOSIS — R911 Solitary pulmonary nodule: Secondary | ICD-10-CM | POA: Insufficient documentation

## 2012-08-26 LAB — COMPREHENSIVE METABOLIC PANEL (CC13)
ALT: 17 U/L (ref 0–55)
AST: 25 U/L (ref 5–34)
BUN: 25.8 mg/dL (ref 7.0–26.0)
Creatinine: 1.2 mg/dL — ABNORMAL HIGH (ref 0.6–1.1)
Total Bilirubin: 0.21 mg/dL (ref 0.20–1.20)

## 2012-08-26 LAB — CBC WITH DIFFERENTIAL/PLATELET
BASO%: 0.2 % (ref 0.0–2.0)
Basophils Absolute: 0 10*3/uL (ref 0.0–0.1)
EOS%: 2.1 % (ref 0.0–7.0)
HCT: 29.1 % — ABNORMAL LOW (ref 34.8–46.6)
LYMPH%: 2.8 % — ABNORMAL LOW (ref 14.0–49.7)
MCH: 34.3 pg — ABNORMAL HIGH (ref 25.1–34.0)
MCHC: 34.4 g/dL (ref 31.5–36.0)
MCV: 99.7 fL (ref 79.5–101.0)
MONO%: 5.8 % (ref 0.0–14.0)
NEUT%: 89.1 % — ABNORMAL HIGH (ref 38.4–76.8)
Platelets: 205 10*3/uL (ref 145–400)

## 2012-08-26 MED ORDER — IOHEXOL 300 MG/ML  SOLN
80.0000 mL | Freq: Once | INTRAMUSCULAR | Status: AC | PRN
  Administered 2012-08-26: 80 mL via INTRAVENOUS

## 2012-08-27 ENCOUNTER — Encounter: Payer: Self-pay | Admitting: Internal Medicine

## 2012-08-27 ENCOUNTER — Ambulatory Visit (HOSPITAL_BASED_OUTPATIENT_CLINIC_OR_DEPARTMENT_OTHER): Payer: BC Managed Care – PPO | Admitting: Internal Medicine

## 2012-08-27 ENCOUNTER — Encounter: Payer: Self-pay | Admitting: *Deleted

## 2012-08-27 DIAGNOSIS — C3490 Malignant neoplasm of unspecified part of unspecified bronchus or lung: Secondary | ICD-10-CM

## 2012-08-27 NOTE — Patient Instructions (Addendum)
The scan showed evidence for disease progression with multiple liver metastasis. We discussed treatment options including enrollment in a clinical trial versus systemic chemotherapy. CURRENT THERAPY: Observation.  CHEMOTHERAPY INTENT: Palliative  CURRENT # OF CHEMOTHERAPY CYCLES: 0  CURRENT ANTIEMETICS: Compazine  CURRENT SMOKING STATUS: Quit smoking few years ago  ORAL CHEMOTHERAPY AND CONSENT: None  CURRENT BISPHOSPHONATES USE: None  PAIN MANAGEMENT: 0/10  NARCOTICS INDUCED CONSTIPATION: N/A  LIVING WILL AND CODE STATUS: Full code.

## 2012-08-27 NOTE — Progress Notes (Signed)
[facility] Telephone:([phone]   Fax:(336) [phone]  OFFICE PROGRESS NOTE  [name], MD [address] 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   PRIOR THERAPY:  1) Concurrent chemoradiation with weekly carboplatin for AUC of 2 and paclitaxel 45 mg/M2.  2) Consolidation systemic chemotherapy with carboplatin for AUC of 5 and paclitaxel 175 mg/M2 with Neulasta support every 3 weeks. Status post 3 cycles, last dose was given on 12/07/2011 with stable disease.   CURRENT THERAPY: Observation.  CHEMOTHERAPY INTENT: Palliative  CURRENT # OF CHEMOTHERAPY CYCLES: 0  CURRENT ANTIEMETICS: Compazine  CURRENT SMOKING STATUS: Quit smoking few years ago  ORAL CHEMOTHERAPY AND CONSENT: None  CURRENT BISPHOSPHONATES USE: None  PAIN MANAGEMENT: 0/10  NARCOTICS INDUCED CONSTIPATION: N/A  LIVING WILL AND CODE STATUS: Full code.   INTERVAL HISTORY: Pamela Levine 67 y.o. female returns to the clinic today for followup visit accompanied by her son. The patient is feeling fine today with no specific complaints except for mild cough especially at night time. The patient denied having any significant weight loss or night sweats. She has no chest pain, shortness breath or hemoptysis. She had repeat CT scan of the chest performed recently and she is here today for evaluation and discussion of her scan results.   CHEMOTHERAPY INTENT:  MEDICAL HISTORY: Past Medical History  Diagnosis Date  . Hyperlipidemia   . Hypertension   . History  of basal cell carcinoma   . Light cigarette smoker   . Kidney stone 01/2000    stent. Renal U/S normal in 12/01.  Marland Kitchen Heart murmur   . Arthritis   . Osteoarthritis   . Lung mass October 2013    RUL  . COPD (chronic obstructive pulmonary  disease)   . Ascending aortic aneurysm   . Cancer     skin  . Lung cancer 12/19/2011  . Hx of radiation therapy 01/01/12- 02/21/12    right upper chest region, 63 gray, 35 fx    ALLERGIES:  is allergic to ace inhibitors.  MEDICATIONS:  Current Outpatient Prescriptions  Medication Sig Dispense Refill  . amLODipine (NORVASC) 10 MG tablet Take 1 tablet (10 mg total) by mouth daily.  90 tablet  3  . Artificial Tear Ointment (DRY EYES OP) Place 1 drop into both eyes 3 (three) times daily as needed. For dry eyes      . aspirin 81 MG chewable tablet Chew 81 mg by mouth daily.        . Calcium Carbonate-Vitamin D (CALTRATE 600+D) 600-400 MG-UNIT per tablet Take 2 tablets by mouth daily.       . ferrous sulfate 325 (65 FE) MG tablet Take 325 mg by mouth daily.      . metoprolol succinate (TOPROL-XL) 50 MG 24 hr tablet Take 1 tablet (50 mg total) by mouth daily. Take with or immediately following a meal.  90 tablet  3  . Multiple Vitamins-Minerals (CENTRUM SILVER PO) Take 1 tablet by mouth daily.       Marland Kitchen olmesartan (BENICAR) 40 MG tablet Take 1 tablet (40 mg total) by mouth daily.  30 tablet  5  . simvastatin (ZOCOR) 20 MG tablet Take 1 tablet (20 mg total) by mouth daily.  90 tablet  3  . chlorpheniramine-HYDROcodone (TUSSIONEX PENNKINETIC  ER) 10-8 MG/5ML LQCR Take 5 mLs by mouth every 12 (twelve) hours as needed.       No current facility-administered medications for this visit.    SURGICAL HISTORY:  Past Surgical History  Procedure Laterality Date  . Partial hysterectomy  1978    bleeding  . Kidney stone surgery  01/2000    stent  . Colonoscopy    . Video bronchoscopy with endobronchial ultrasound  11/27/2011    Procedure: VIDEO BRONCHOSCOPY WITH ENDOBRONCHIAL ULTRASOUND;  Surgeon: Leslye Peer, MD;  Location: [facility] OR;  Service: Pulmonary;  Laterality: N/A;    REVIEW OF SYSTEMS:  A comprehensive review of systems was negative except for: Respiratory: positive for cough   PHYSICAL  EXAMINATION: General appearance: alert, cooperative and no distress Head: Normocephalic, without obvious abnormality, atraumatic Neck: no adenopathy Lymph nodes: Cervical, supraclavicular, and axillary nodes normal. Resp: clear to auscultation bilaterally Cardio: regular rate and rhythm, S1, S2 normal, no murmur, click, rub or gallop GI: soft, non-tender; bowel sounds normal; no masses,  no organomegaly Extremities: extremities normal, atraumatic, no cyanosis or edema Neurologic: Alert and oriented X 3, normal strength and tone. Normal symmetric reflexes. Normal coordination and gait  ECOG PERFORMANCE STATUS: 1 - Symptomatic but completely ambulatory  Blood pressure 182/68, pulse 74, temperature 97.9 F (36.6 C), temperature source Oral, resp. rate 18, height 5' 7.5" (1.715 m), weight 120 lb 8 oz (54.658 kg), last menstrual period 01/29/1976.  LABORATORY DATA: Lab Results  Component Value Date   WBC 15.1* 08/26/2012   HGB 10.0* 08/26/2012   HCT 29.1* 08/26/2012   MCV 99.7 08/26/2012   PLT 205 08/26/2012      Chemistry      Component Value Date/Time   NA 136 08/26/2012 0921   NA 141 11/26/2011 1430   K 4.6 08/26/2012 0921   K 3.4* 11/26/2011 1430   CL 103 05/27/2012 0854   CL 101 11/26/2011 1430   CO2 24 08/26/2012 0921   CO2 30 11/26/2011 1430   BUN 25.8 08/26/2012 0921   BUN 29* 11/26/2011 1430   CREATININE 1.2* 08/26/2012 0921   CREATININE 1.27* 11/26/2011 1430      Component Value Date/Time   CALCIUM 9.3 08/26/2012 0921   CALCIUM 10.0 11/26/2011 1430   ALKPHOS 193* 08/26/2012 0921   ALKPHOS 100 11/26/2011 1430   AST 25 08/26/2012 0921   AST 25 11/26/2011 1430   ALT 17 08/26/2012 0921   ALT 17 11/26/2011 1430   BILITOT 0.21 08/26/2012 0921   BILITOT 0.1* 11/26/2011 1430       RADIOGRAPHIC STUDIES: Ct Chest W Contrast  08/26/2012   *RADIOLOGY REPORT*  Clinical Data: Restaging lung cancer  CT CHEST WITH CONTRAST  Technique:  Multidetector CT imaging of the chest was  performed following the standard protocol during bolus administration of intravenous contrast.  Contrast: 80mL OMNIPAQUE IOHEXOL 300 MG/ML  SOLN  Comparison: 05/22/2012  Findings: There is a small right pleural effusion identified. Stable appearance of the right apical lung density.  Similar appearance of nodular scarring within the right middle lobe is identified, image 25/series 5.  This likely reflects a combination of pleural parenchymal scarring and known lung lesion. There is a 5 mm nodule in the left lower lobe, image 48/series 5.  This is unchanged from previous exam.  Left upper lobe nodule is unchanged measuring 3 mm, image 20/series 5.  There is a new nodule in the right middle lobe measuring 7 mm, image 43/series 5.  The  heart size appears normal.  No pericardial effusion.  Prominent coronary artery calcifications involve the LAD, left circumflex and RCA coronary arteries. No mediastinal or hilar adenopathy identified.  There is no axillary or supraclavicular adenopathy.  Interval development of multi focal low attenuation lesions throughout the liver parenchyma identified.  Index lesion in the anterior right hepatic lobe measures 0.8 cm, image 56/series 2. Posterior right hepatic lobe lesion measures 6 mm, image 62/series 2.  Within the left hepatic lobe there is a 7 mm low attenuation lesion, image 53/series 2.  The adrenal glands both appear normal.  Review of the visualized bony structures is significant for multilevel spondylosis.  IMPRESSION:  1.  Interval development of multifocal low attenuation lesions within the liver parenchyma.  Suspicious for metastatic disease. 2.  No significant change in the appearance of the lungs.  There is a new nodule in the right middle lobe which measures 7 mm. 3.  Coronary artery calcifications.   Original Report Authenticated By: Signa Kell, M.D.    ASSESSMENT AND PLAN: This is a very pleasant 67 years old white female with history of stage IIIa non-small  cell lung cancer status post concurrent chemoradiation followed by consolidation chemotherapy and now has evidence for metastatic disease to the liver. I have a lengthy discussion with the patient and her son today about her current condition and it showed them the images of the CT scan of the chest. I gave the patient the option of systemic chemotherapy versus enrollment in a clinical trial with either immunotherapy with Nivolumab but if she is not eligible, she could be also considered for treatment on a clinical trial with the [employer] maintenance Abraxane. The patient will be seen by the clinical research nurse later today for eligibility of either trials. If she is not eligible for any of the clinical trials available at this point, I would consider the patient for treatment with carboplatin for AUC of 5 on day 1 and gemcitabine 1000 mg/M2 on days 1 and 8 every 3 weeks. I discussed with the patient adverse effect of the chemotherapy including but not limited to alopecia, myelosuppression, nausea and vomiting, peripheral neuropathy, liver or in dysfunction.  The patient voices understanding of current disease status and treatment options and is in agreement with the current care plan.  All questions were answered. The patient knows to call the clinic with any problems, questions or concerns. We can certainly see the patient much sooner if necessary.  I spent 15 minutes counseling the patient face to face. The total time spent in the appointment was 25 minutes.

## 2012-08-28 ENCOUNTER — Ambulatory Visit: Payer: Self-pay | Admitting: Oncology

## 2012-09-04 ENCOUNTER — Telehealth: Payer: Self-pay | Admitting: *Deleted

## 2012-09-04 NOTE — Telephone Encounter (Signed)
Onc tx schedule filled out.   

## 2012-09-04 NOTE — Telephone Encounter (Signed)
Message copied by Caren Griffins on Fri Sep 04, 2012 12:31 PM ------      Message from: [name]      Created: Fri Sep 04, 2012 11:30 AM      Regarding: FW: Patient referred for clinical trial       Please schedule F/U appt next week      ----- Message -----         From: Kerry Fort, RN         Sent: 09/04/2012   9:47 AM           To: Marlan Palau, RN, #      Subject: Patient referred for clinical trial                      Dr. Arbutus Ped,      I met with Mrs. Wile to follow up the discussion about her participating in the Nivolumab study. She is the patient with only "non-measurable disease" by RECIST 1.1 criteria.       She still has a lot of questions since her appointment with you last week and wants to have another appointment to address these questions.             She has declined the study for now because She feels good now and is concerned about feeling worse when she begins treatment.            She wondered if she should get a second opinion but was not sure where she would want to go. She is very satisfied with her care here. She is just seeking information.             I told her she would get a message from someone in scheduling about a return appointment.             Thank you for the referral,      Corrie Dandy             ------

## 2012-09-07 ENCOUNTER — Telehealth: Payer: Self-pay | Admitting: Internal Medicine

## 2012-09-07 NOTE — Telephone Encounter (Signed)
s.w. pt and advised on 8.14.14 appt....pt ok and aware

## 2012-09-10 ENCOUNTER — Ambulatory Visit (HOSPITAL_BASED_OUTPATIENT_CLINIC_OR_DEPARTMENT_OTHER): Payer: BC Managed Care – PPO | Admitting: Internal Medicine

## 2012-09-10 ENCOUNTER — Encounter: Payer: Self-pay | Admitting: Internal Medicine

## 2012-09-10 VITALS — BP 177/54 | HR 77 | Temp 98.1°F | Resp 19 | Ht 67.5 in | Wt 119.2 lb

## 2012-09-10 DIAGNOSIS — C3492 Malignant neoplasm of unspecified part of left bronchus or lung: Secondary | ICD-10-CM

## 2012-09-10 DIAGNOSIS — C787 Secondary malignant neoplasm of liver and intrahepatic bile duct: Secondary | ICD-10-CM

## 2012-09-10 DIAGNOSIS — C3410 Malignant neoplasm of upper lobe, unspecified bronchus or lung: Secondary | ICD-10-CM

## 2012-09-10 NOTE — Progress Notes (Signed)
[facility] Telephone:(336) [phone]   Fax:(336) [phone]  OFFICE PROGRESS NOTE  [name], MD [address] 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   PRIOR THERAPY:  1) Concurrent chemoradiation with weekly carboplatin for AUC of 2 and paclitaxel 45 mg/M2.  2) Consolidation systemic chemotherapy with carboplatin for AUC of 5 and paclitaxel 175 mg/M2 with Neulasta support every 3 weeks. Status post 3 cycles, last dose was given on 12/07/2011 with stable disease.   CURRENT THERAPY: systemic chemotherapy with carboplatin for AUC of 5 on day 1 and gemcitabine 1000 mg/M2 on days 1 and 8 every 3 weeks, first cycle on 09/16/2012.    CHEMOTHERAPY INTENT: Palliative  CURRENT # OF CHEMOTHERAPY CYCLES: 0  CURRENT ANTIEMETICS: Compazine  CURRENT SMOKING STATUS: Quit smoking few years ago  ORAL CHEMOTHERAPY AND CONSENT: None  CURRENT BISPHOSPHONATES USE: None  PAIN MANAGEMENT: 0/10  NARCOTICS INDUCED CONSTIPATION: N/A  LIVING WILL AND CODE STATUS: Full code.   INTERVAL HISTORY: Pamela Levine 67 y.o. female returns to the clinic today for follow up visit accompanied by her sister-in-law. The patient is feeling a little bit better today after she processed the bad news of disease progression last week. She was considered for immunotherapy clinical trial with Nivolumab but the patient does not have enough measurable disease to be eligible for the trial. She came today for evaluation and discussion of other treatment options. She denied having any significant nausea or vomiting. She has no chest pain, shortness breath, cough or hemoptysis. She has no significant weight loss or night sweats.  MEDICAL HISTORY: Past Medical History  Diagnosis Date  . Hyperlipidemia   . Hypertension     . History  of basal cell carcinoma   . Light cigarette smoker   . Kidney stone 01/2000    stent. Renal U/S normal in 12/01.  Marland Kitchen Heart murmur   . Arthritis   . Osteoarthritis   . Lung mass October 2013    RUL  . COPD (chronic obstructive pulmonary disease)   . Ascending aortic aneurysm   . Cancer     skin  . Lung cancer 12/19/2011  . Hx of radiation therapy 01/01/12- 02/21/12    right upper chest region, 63 gray, 35 fx    ALLERGIES:  is allergic to ace inhibitors.  MEDICATIONS:  Current Outpatient Prescriptions  Medication Sig Dispense Refill  . amLODipine (NORVASC) 10 MG tablet Take 1 tablet (10 mg total) by mouth daily.  90 tablet  3  . Artificial Tear Ointment (DRY EYES OP) Place 1 drop into both eyes 3 (three) times daily as needed. For dry eyes      . aspirin 81 MG chewable tablet Chew 81 mg by mouth daily.        . Calcium Carbonate-Vitamin D (CALTRATE 600+D) 600-400 MG-UNIT per tablet Take 2 tablets by mouth daily.       . ferrous sulfate 325 (65 FE) MG tablet Take 325 mg by mouth daily.      . metoprolol succinate (TOPROL-XL) 50 MG 24 hr tablet Take 1 tablet (50 mg total) by mouth daily. Take with or immediately following a meal.  90 tablet  3  . Multiple Vitamins-Minerals (CENTRUM SILVER PO) Take 1 tablet by mouth daily.       Marland Kitchen  olmesartan (BENICAR) 40 MG tablet Take 1 tablet (40 mg total) by mouth daily.  30 tablet  5  . simvastatin (ZOCOR) 20 MG tablet Take 1 tablet (20 mg total) by mouth daily.  90 tablet  3   No current facility-administered medications for this visit.    SURGICAL HISTORY:  Past Surgical History  Procedure Laterality Date  . Partial hysterectomy  1978    bleeding  . Kidney stone surgery  01/2000    stent  . Colonoscopy    . Video bronchoscopy with endobronchial ultrasound  11/27/2011    Procedure: VIDEO BRONCHOSCOPY WITH ENDOBRONCHIAL ULTRASOUND;  Surgeon: Leslye Peer, MD;  Location: [facility] OR;  Service: Pulmonary;  Laterality: N/A;    REVIEW  OF SYSTEMS:  A comprehensive review of systems was negative.   PHYSICAL EXAMINATION: General appearance: alert, cooperative and no distress Head: Normocephalic, without obvious abnormality, atraumatic Neck: no adenopathy Lymph nodes: Cervical, supraclavicular, and axillary nodes normal. Resp: clear to auscultation bilaterally Cardio: regular rate and rhythm, S1, S2 normal, no murmur, click, rub or gallop GI: soft, non-tender; bowel sounds normal; no masses,  no organomegaly Extremities: extremities normal, atraumatic, no cyanosis or edema Neurologic: Alert and oriented X 3, normal strength and tone. Normal symmetric reflexes. Normal coordination and gait  ECOG PERFORMANCE STATUS: 1 - Symptomatic but completely ambulatory  Blood pressure 177/54, pulse 77, temperature 98.1 F (36.7 C), temperature source Oral, resp. rate 19, height 5' 7.5" (1.715 m), weight 119 lb 3.2 oz (54.069 kg), last menstrual period 01/29/1976, SpO2 99.00%.  LABORATORY DATA: Lab Results  Component Value Date   WBC 15.1* 08/26/2012   HGB 10.0* 08/26/2012   HCT 29.1* 08/26/2012   MCV 99.7 08/26/2012   PLT 205 08/26/2012      Chemistry      Component Value Date/Time   NA 136 08/26/2012 0921   NA 141 11/26/2011 1430   K 4.6 08/26/2012 0921   K 3.4* 11/26/2011 1430   CL 103 05/27/2012 0854   CL 101 11/26/2011 1430   CO2 24 08/26/2012 0921   CO2 30 11/26/2011 1430   BUN 25.8 08/26/2012 0921   BUN 29* 11/26/2011 1430   CREATININE 1.2* 08/26/2012 0921   CREATININE 1.27* 11/26/2011 1430      Component Value Date/Time   CALCIUM 9.3 08/26/2012 0921   CALCIUM 10.0 11/26/2011 1430   ALKPHOS 193* 08/26/2012 0921   ALKPHOS 100 11/26/2011 1430   AST 25 08/26/2012 0921   AST 25 11/26/2011 1430   ALT 17 08/26/2012 0921   ALT 17 11/26/2011 1430   BILITOT 0.21 08/26/2012 0921   BILITOT 0.1* 11/26/2011 1430       RADIOGRAPHIC STUDIES: Ct Chest W Contrast  08/26/2012   *RADIOLOGY REPORT*  Clinical Data: Restaging lung  cancer  CT CHEST WITH CONTRAST  Technique:  Multidetector CT imaging of the chest was performed following the standard protocol during bolus administration of intravenous contrast.  Contrast: 80mL OMNIPAQUE IOHEXOL 300 MG/ML  SOLN  Comparison: 05/22/2012  Findings: There is a small right pleural effusion identified. Stable appearance of the right apical lung density.  Similar appearance of nodular scarring within the right middle lobe is identified, image 25/series 5.  This likely reflects a combination of pleural parenchymal scarring and known lung lesion. There is a 5 mm nodule in the left lower lobe, image 48/series 5.  This is unchanged from previous exam.  Left upper lobe nodule is unchanged measuring 3 mm, image 20/series 5.  There  is a new nodule in the right middle lobe measuring 7 mm, image 43/series 5.  The heart size appears normal.  No pericardial effusion.  Prominent coronary artery calcifications involve the LAD, left circumflex and RCA coronary arteries. No mediastinal or hilar adenopathy identified.  There is no axillary or supraclavicular adenopathy.  Interval development of multi focal low attenuation lesions throughout the liver parenchyma identified.  Index lesion in the anterior right hepatic lobe measures 0.8 cm, image 56/series 2. Posterior right hepatic lobe lesion measures 6 mm, image 62/series 2.  Within the left hepatic lobe there is a 7 mm low attenuation lesion, image 53/series 2.  The adrenal glands both appear normal.  Review of the visualized bony structures is significant for multilevel spondylosis.  IMPRESSION:  1.  Interval development of multifocal low attenuation lesions within the liver parenchyma.  Suspicious for metastatic disease. 2.  No significant change in the appearance of the lungs.  There is a new nodule in the right middle lobe which measures 7 mm. 3.  Coronary artery calcifications.   Original Report Authenticated By: Signa Kell, M.D.    ASSESSMENT AND PLAN:  this is a very pleasant 67 years old white female now with metastatic non-small cell lung cancer, squamous cell carcinoma with evidence for disease progression in the liver. I have a lengthy discussion with the patient about her current disease status and treatment options. The patient is ineligible for the Nivolumab clinical trial because the liver lesion were small and did not meet criteria for measurable disease. I discussed with the patient treatment with carboplatin for AUC of 5 on day 1 and gemcitabine 1000 mg/M2 on days 1 and 8 every 3 weeks. I discussed with the patient adverse effect of this treatment including but not limited to alopecia, myelosuppression, nausea and vomiting, peripheral neuropathy, liver or renal dysfunction. The patient would like to proceed with treatment as planned and she is expected to start the first cycle of this treatment on 09/16/2012. She would come back for follow up visit in 2 weeks for evaluation and management any adverse effects of her chemotherapy. The patient was advised to call immediately if she has any concerning symptoms in the interval.  The patient voices understanding of current disease status and treatment options and is in agreement with the current care plan.  All questions were answered. The patient knows to call the clinic with any problems, questions or concerns. We can certainly see the patient much sooner if necessary.  I spent 15 minutes counseling the patient face to face. The total time spent in the appointment was 25 minutes.

## 2012-09-11 ENCOUNTER — Telehealth: Payer: Self-pay | Admitting: *Deleted

## 2012-09-11 ENCOUNTER — Telehealth: Payer: Self-pay | Admitting: Internal Medicine

## 2012-09-11 ENCOUNTER — Encounter: Payer: Self-pay | Admitting: Internal Medicine

## 2012-09-11 NOTE — Telephone Encounter (Signed)
sw.. pt and advised on 8.20 and 8.27.14 appts...pt ok and awre

## 2012-09-11 NOTE — Progress Notes (Signed)
Faxed clinical information to [employer] @ [phone] °

## 2012-09-11 NOTE — Progress Notes (Signed)
Short Term Disability Form from [employer] given to Axel Filler in medical mgmt to review.  SLJ

## 2012-09-11 NOTE — Telephone Encounter (Signed)
Per staff phone call and POF I have schedueld appts.  JMW  

## 2012-09-15 ENCOUNTER — Other Ambulatory Visit: Payer: Self-pay | Admitting: *Deleted

## 2012-09-15 MED ORDER — OLMESARTAN MEDOXOMIL 40 MG PO TABS: 40.0000 mg | ORAL_TABLET | Freq: Every day | ORAL | Status: DC

## 2012-09-16 ENCOUNTER — Ambulatory Visit (HOSPITAL_BASED_OUTPATIENT_CLINIC_OR_DEPARTMENT_OTHER): Payer: BC Managed Care – PPO

## 2012-09-16 ENCOUNTER — Telehealth: Payer: Self-pay | Admitting: *Deleted

## 2012-09-16 ENCOUNTER — Other Ambulatory Visit (HOSPITAL_BASED_OUTPATIENT_CLINIC_OR_DEPARTMENT_OTHER): Payer: BC Managed Care – PPO | Admitting: Lab

## 2012-09-16 DIAGNOSIS — C3490 Malignant neoplasm of unspecified part of unspecified bronchus or lung: Secondary | ICD-10-CM

## 2012-09-16 DIAGNOSIS — Z5111 Encounter for antineoplastic chemotherapy: Secondary | ICD-10-CM

## 2012-09-16 DIAGNOSIS — C3410 Malignant neoplasm of upper lobe, unspecified bronchus or lung: Secondary | ICD-10-CM

## 2012-09-16 LAB — CBC WITH DIFFERENTIAL/PLATELET
BASO%: 0.2 % (ref 0.0–2.0)
Basophils Absolute: 0.1 10*3/uL (ref 0.0–0.1)
EOS%: 1.5 % (ref 0.0–7.0)
HCT: 25.8 % — ABNORMAL LOW (ref 34.8–46.6)
HGB: 8.4 g/dL — ABNORMAL LOW (ref 11.6–15.9)
LYMPH%: 2.8 % — ABNORMAL LOW (ref 14.0–49.7)
MCH: 31.7 pg (ref 25.1–34.0)
MCHC: 32.6 g/dL (ref 31.5–36.0)
MONO#: 1.4 10*3/uL — ABNORMAL HIGH (ref 0.1–0.9)
NEUT%: 90.4 % — ABNORMAL HIGH (ref 38.4–76.8)
Platelets: 267 10*3/uL (ref 145–400)

## 2012-09-16 LAB — COMPREHENSIVE METABOLIC PANEL (CC13)
AST: 62 U/L — ABNORMAL HIGH (ref 5–34)
Albumin: 2.2 g/dL — ABNORMAL LOW (ref 3.5–5.0)
BUN: 22.1 mg/dL (ref 7.0–26.0)
Calcium: 9.2 mg/dL (ref 8.4–10.4)
Chloride: 99 mEq/L (ref 98–109)
Creatinine: 1.4 mg/dL — ABNORMAL HIGH (ref 0.6–1.1)
Glucose: 99 mg/dl (ref 70–140)
Potassium: 4.1 mEq/L (ref 3.5–5.1)

## 2012-09-16 MED ORDER — ONDANSETRON 16 MG/50ML IVPB (CHCC)
16.0000 mg | Freq: Once | INTRAVENOUS | Status: AC
  Administered 2012-09-16: 16 mg via INTRAVENOUS

## 2012-09-16 MED ORDER — DEXAMETHASONE SODIUM PHOSPHATE 20 MG/5ML IJ SOLN
20.0000 mg | Freq: Once | INTRAMUSCULAR | Status: AC
  Administered 2012-09-16: 20 mg via INTRAVENOUS

## 2012-09-16 MED ORDER — SODIUM CHLORIDE 0.9 % IJ SOLN
10.0000 mL | INTRAMUSCULAR | Status: DC | PRN
  Filled 2012-09-16: qty 10

## 2012-09-16 MED ORDER — SODIUM CHLORIDE 0.9 % IV SOLN
1000.0000 mg/m2 | Freq: Once | INTRAVENOUS | Status: AC
  Administered 2012-09-16: 1596 mg via INTRAVENOUS
  Filled 2012-09-16: qty 41.98

## 2012-09-16 MED ORDER — SODIUM CHLORIDE 0.9 % IV SOLN
320.0000 mg | Freq: Once | INTRAVENOUS | Status: AC
  Administered 2012-09-16: 320 mg via INTRAVENOUS
  Filled 2012-09-16: qty 32

## 2012-09-16 MED ORDER — SODIUM CHLORIDE 0.9 % IV SOLN
Freq: Once | INTRAVENOUS | Status: AC
  Administered 2012-09-16: 10:00:00 via INTRAVENOUS

## 2012-09-16 NOTE — Telephone Encounter (Signed)
Per staff message and POF I have scheduled appts.  JMW  

## 2012-09-16 NOTE — Progress Notes (Signed)
@  1050-reports IV site stinging due to Gemzar. Added extra line of IV saline to run at gravity to further dilute Gemzar. This provided complete relief of discomfort.

## 2012-09-16 NOTE — Patient Instructions (Signed)
[facility] Discharge Instructions for Patients Receiving Chemotherapy  Today you received the following chemotherapy agents : Gemcitabine & Carboplatin  To help prevent nausea and vomiting after your treatment, we encourage you to take your nausea medication : Compazine 10 mg every 6 hours as needed   If you develop nausea and vomiting that is not controlled by your nausea medication, call the clinic.   BELOW ARE SYMPTOMS THAT SHOULD BE REPORTED IMMEDIATELY:  *FEVER GREATER THAN 100.5 F  *CHILLS WITH OR WITHOUT FEVER  NAUSEA AND VOMITING THAT IS NOT CONTROLLED WITH YOUR NAUSEA MEDICATION  *UNUSUAL SHORTNESS OF BREATH  *UNUSUAL BRUISING OR BLEEDING  TENDERNESS IN MOUTH AND THROAT WITH OR WITHOUT PRESENCE OF ULCERS  *URINARY PROBLEMS  *BOWEL PROBLEMS  UNUSUAL RASH Items with * indicate a potential emergency and should be followed up as soon as possible.  Feel free to call the clinic you have any questions or concerns. The clinic phone number is [phone].  It has been a pleasure to serve you today.

## 2012-09-17 ENCOUNTER — Telehealth: Payer: Self-pay | Admitting: *Deleted

## 2012-09-17 NOTE — Telephone Encounter (Signed)
Called patient at home, after 1st gemzar/carbo chemotherapy.  States that she is feeling better today.  No nausea, vomiting, or diarrhea.  Is eating and drinking without problems.  All questions answered.  Knows to call if she has any problems or concerns.

## 2012-09-18 LAB — CBC WITH DIFFERENTIAL/PLATELET
Basophil #: 0.1 10*3/uL (ref 0.0–0.1)
Basophil %: 0.3 %
Eosinophil %: 2.6 %
HCT: 24.6 % — ABNORMAL LOW (ref 35.0–47.0)
HGB: 8.3 g/dL — ABNORMAL LOW (ref 12.0–16.0)
MCH: 32.7 pg (ref 26.0–34.0)
MCHC: 33.9 g/dL (ref 32.0–36.0)
Monocyte #: 0.1 x10 3/mm — ABNORMAL LOW (ref 0.2–0.9)
Monocyte %: 0.4 %

## 2012-09-18 LAB — URINALYSIS, COMPLETE
Bilirubin,UR: NEGATIVE
Blood: NEGATIVE
Leukocyte Esterase: NEGATIVE
Ph: 6 (ref 4.5–8.0)
RBC,UR: <1 /HPF (ref 0–5)
Squamous Epithelial: 1
WBC UR: 3 /HPF (ref 0–5)

## 2012-09-18 LAB — COMPREHENSIVE METABOLIC PANEL
Albumin: 2.2 g/dL — ABNORMAL LOW (ref 3.4–5.0)
Anion Gap: 8 (ref 7–16)
BUN: 34 mg/dL — ABNORMAL HIGH (ref 7–18)
Chloride: 98 mmol/L (ref 98–107)
Co2: 25 mmol/L (ref 21–32)
EGFR (African American): 38 — ABNORMAL LOW
SGPT (ALT): 174 U/L — ABNORMAL HIGH (ref 12–78)
Sodium: 131 mmol/L — ABNORMAL LOW (ref 136–145)

## 2012-09-18 LAB — TROPONIN I: Troponin-I: 0.15 ng/mL — ABNORMAL HIGH

## 2012-09-19 ENCOUNTER — Inpatient Hospital Stay: Payer: Self-pay | Admitting: Internal Medicine

## 2012-09-20 LAB — COMPREHENSIVE METABOLIC PANEL
BUN: 30 mg/dL — ABNORMAL HIGH (ref 7–18)
Calcium, Total: 9.3 mg/dL (ref 8.5–10.1)
Chloride: 103 mmol/L (ref 98–107)
Creatinine: 1.83 mg/dL — ABNORMAL HIGH (ref 0.60–1.30)
EGFR (African American): 33 — ABNORMAL LOW
EGFR (Non-African Amer.): 28 — ABNORMAL LOW
Osmolality: 279 (ref 275–301)
Potassium: 4.2 mmol/L (ref 3.5–5.1)
SGOT(AST): 158 U/L — ABNORMAL HIGH (ref 15–37)
SGPT (ALT): 167 U/L — ABNORMAL HIGH (ref 12–78)
Total Protein: 6.5 g/dL (ref 6.4–8.2)

## 2012-09-20 LAB — CBC WITH DIFFERENTIAL/PLATELET
Basophil #: 0.1 10*3/uL (ref 0.0–0.1)
Eosinophil #: 0.3 10*3/uL (ref 0.0–0.7)
HCT: 25.1 % — ABNORMAL LOW (ref 35.0–47.0)
Lymphocyte #: 0.3 10*3/uL — ABNORMAL LOW (ref 1.0–3.6)
MCHC: 34 g/dL (ref 32.0–36.0)
Monocyte #: 0.1 x10 3/mm — ABNORMAL LOW (ref 0.2–0.9)
Monocyte %: 0.9 %
Neutrophil #: 8.2 10*3/uL — ABNORMAL HIGH (ref 1.4–6.5)
RBC: 2.56 10*6/uL — ABNORMAL LOW (ref 3.80–5.20)
RDW: 14.4 % (ref 11.5–14.5)
WBC: 8.9 10*3/uL (ref 3.6–11.0)

## 2012-09-20 LAB — URINE CULTURE

## 2012-09-21 LAB — BASIC METABOLIC PANEL
Anion Gap: 8 (ref 7–16)
Calcium, Total: 8.3 mg/dL — ABNORMAL LOW (ref 8.5–10.1)
Chloride: 103 mmol/L (ref 98–107)
Co2: 21 mmol/L (ref 21–32)
EGFR (African American): 39 — ABNORMAL LOW
Glucose: 96 mg/dL (ref 65–99)
Sodium: 132 mmol/L — ABNORMAL LOW (ref 136–145)

## 2012-09-21 LAB — IRON AND TIBC
Iron Bind.Cap.(Total): 150 ug/dL — ABNORMAL LOW (ref 250–450)
Iron Saturation: 51 %
Iron: 76 ug/dL (ref 50–170)
Unbound Iron-Bind.Cap.: 74 ug/dL

## 2012-09-22 ENCOUNTER — Telehealth: Payer: Self-pay | Admitting: *Deleted

## 2012-09-22 LAB — CBC WITH DIFFERENTIAL/PLATELET
Basophil #: 0 10*3/uL (ref 0.0–0.1)
Eosinophil %: 0.8 %
HCT: 23.4 % — ABNORMAL LOW (ref 35.0–47.0)
HGB: 8.2 g/dL — ABNORMAL LOW (ref 12.0–16.0)
Lymphocyte %: 4 %
MCH: 33.6 pg (ref 26.0–34.0)
MCHC: 35.2 g/dL (ref 32.0–36.0)
MCV: 96 fL (ref 80–100)
Monocyte %: 0.9 %
Neutrophil #: 7.6 10*3/uL — ABNORMAL HIGH (ref 1.4–6.5)
Neutrophil %: 94.2 %
RBC: 2.45 10*6/uL — ABNORMAL LOW (ref 3.80–5.20)
WBC: 8.1 10*3/uL (ref 3.6–11.0)

## 2012-09-22 LAB — COMPREHENSIVE METABOLIC PANEL
Albumin: 1.7 g/dL — ABNORMAL LOW (ref 3.4–5.0)
Alkaline Phosphatase: 889 U/L — ABNORMAL HIGH (ref 50–136)
Anion Gap: 7 (ref 7–16)
BUN: 26 mg/dL — ABNORMAL HIGH (ref 7–18)
BUN: 26 mg/dL — ABNORMAL HIGH (ref 7–18)
Bilirubin,Total: 0.5 mg/dL (ref 0.2–1.0)
Bilirubin,Total: 0.8 mg/dL (ref 0.2–1.0)
Calcium, Total: 8.5 mg/dL (ref 8.5–10.1)
Chloride: 104 mmol/L (ref 98–107)
Co2: 20 mmol/L — ABNORMAL LOW (ref 21–32)
EGFR (African American): 36 — ABNORMAL LOW
Glucose: 81 mg/dL (ref 65–99)
Glucose: 102 mg/dL — ABNORMAL HIGH (ref 65–99)
Osmolality: 273 (ref 275–301)
Potassium: 3.8 mmol/L (ref 3.5–5.1)
Potassium: 3.9 mmol/L (ref 3.5–5.1)
SGOT(AST): 86 U/L — ABNORMAL HIGH (ref 15–37)
SGOT(AST): 108 U/L — ABNORMAL HIGH (ref 15–37)
SGPT (ALT): 102 U/L — ABNORMAL HIGH (ref 12–78)
Sodium: 133 mmol/L — ABNORMAL LOW (ref 136–145)
Total Protein: 6.1 g/dL — ABNORMAL LOW (ref 6.4–8.2)

## 2012-09-22 LAB — HEMOGLOBIN
HGB: 6.8 g/dL — ABNORMAL LOW (ref 12.0–16.0)
HGB: 6.8 g/dL — ABNORMAL LOW (ref 12.0–16.0)

## 2012-09-22 LAB — CREATININE, SERUM
Creatinine: 1.63 mg/dL — ABNORMAL HIGH (ref 0.60–1.30)
EGFR (Non-African Amer.): 33 — ABNORMAL LOW
EGFR (Non-African Amer.): 32 — ABNORMAL LOW

## 2012-09-22 NOTE — Telephone Encounter (Signed)
Pt called left msg stating that she has been in the hospital at [facility] and will not be able to come to f/u appt tomorrow.  Called and left msg informing her that we can cancel her appts tomorrow and she is to call back so we can discuss with Dr Donnald Garre when to r/s her appts.  SLJ

## 2012-09-23 ENCOUNTER — Ambulatory Visit: Payer: BC Managed Care – PPO

## 2012-09-23 ENCOUNTER — Other Ambulatory Visit: Payer: BC Managed Care – PPO | Admitting: Lab

## 2012-09-23 ENCOUNTER — Ambulatory Visit: Payer: BC Managed Care – PPO | Admitting: Physician Assistant

## 2012-09-23 LAB — CULTURE, BLOOD (SINGLE)

## 2012-09-25 ENCOUNTER — Encounter: Payer: Self-pay | Admitting: Family Medicine

## 2012-09-25 ENCOUNTER — Telehealth: Payer: Self-pay | Admitting: Medical Oncology

## 2012-09-25 ENCOUNTER — Ambulatory Visit (INDEPENDENT_AMBULATORY_CARE_PROVIDER_SITE_OTHER): Payer: BC Managed Care – PPO | Admitting: Family Medicine

## 2012-09-25 ENCOUNTER — Telehealth: Payer: Self-pay | Admitting: *Deleted

## 2012-09-25 VITALS — BP 122/54 | HR 72 | Temp 97.5°F | Ht 67.5 in | Wt 117.5 lb

## 2012-09-25 DIAGNOSIS — D649 Anemia, unspecified: Secondary | ICD-10-CM

## 2012-09-25 DIAGNOSIS — N390 Urinary tract infection, site not specified: Secondary | ICD-10-CM

## 2012-09-25 DIAGNOSIS — J189 Pneumonia, unspecified organism: Secondary | ICD-10-CM

## 2012-09-25 DIAGNOSIS — C3490 Malignant neoplasm of unspecified part of unspecified bronchus or lung: Secondary | ICD-10-CM

## 2012-09-25 DIAGNOSIS — D638 Anemia in other chronic diseases classified elsewhere: Secondary | ICD-10-CM | POA: Insufficient documentation

## 2012-09-25 LAB — POCT UA - MICROSCOPIC ONLY
Casts, Ur, LPF, POC: 0
Yeast, UA: 0

## 2012-09-25 LAB — BASIC METABOLIC PANEL
CO2: 22 mEq/L (ref 19–32)
Chloride: 99 mEq/L (ref 96–112)
Creatinine, Ser: 1.4 mg/dL — ABNORMAL HIGH (ref 0.4–1.2)
Potassium: 4.1 mEq/L (ref 3.5–5.1)

## 2012-09-25 LAB — CBC WITH DIFFERENTIAL/PLATELET
Basophils Absolute: 0 10*3/uL (ref 0.0–0.1)
HCT: 23.7 % — CL (ref 36.0–46.0)
Lymphs Abs: 0.4 10*3/uL — ABNORMAL LOW (ref 0.7–4.0)
MCHC: 33.7 g/dL (ref 30.0–36.0)
MCV: 97 fl (ref 78.0–100.0)
Monocytes Absolute: 0.4 10*3/uL (ref 0.1–1.0)
Platelets: 36 10*3/uL — CL (ref 150.0–400.0)
RDW: 14.7 % — ABNORMAL HIGH (ref 11.5–14.6)

## 2012-09-25 LAB — POCT URINALYSIS DIPSTICK
Ketones, UA: NEGATIVE
Leukocytes, UA: NEGATIVE
pH, UA: 6

## 2012-09-25 MED ORDER — TRAMADOL HCL 50 MG PO TABS: 50.0000 mg | ORAL_TABLET | Freq: Three times a day (TID) | ORAL | Status: DC | PRN

## 2012-09-25 NOTE — Telephone Encounter (Signed)
Message copied by Charma Igo on Fri Sep 25, 2012 12:08 PM ------      Message from: Caren Griffins      Created: Fri Sep 25, 2012 10:35 AM      Regarding: FW: when to f/u?                   ----- Message -----         From: [name], MD         Sent: 09/25/2012   9:28 AM           To: Marlan Palau, RN      Subject: RE: when to f/u?                                         9/10 appt      ----- Message -----         From: Marlan Palau, RN         Sent: 09/23/2012   4:07 PM           To: [name], MD, Charma Igo, RN      Subject: when to f/u?                                             Pt is out of the hospital and wants to know when you would like to f/u with her?            She missed some chemotherapy and is scheduled for another cycle on 9/10.         ------

## 2012-09-25 NOTE — Patient Instructions (Addendum)
Cut the levaquin antibiotic in 1/2 and just take 1/2 daily (until you finish the 7 days)  Take with a meal  Keep trying to load fluids  Labs and urine today For back pain try tramadol instead of the oxycodone (also try to take with food) Follow up with oncology as planned

## 2012-09-25 NOTE — Telephone Encounter (Signed)
[place] lab called critical results:   Platelet count-36,000 Hemoglobin-8.0 Hematocrit-23.7  Dr. Milinda Antis notified

## 2012-09-25 NOTE — Progress Notes (Signed)
Subjective:    Patient ID: Pamela Levine, female    DOB: [birth year], 67 y.o.   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 From the abx   Low back pain - on and off , worse after sleeping on hosp bed    She will see oncol next week   Patient Active Problem List   Diagnosis Date Noted  . Pneumonia 09/25/2012  . UTI (urinary tract infection) 09/25/2012  . Anemia 09/25/2012  . Hx of radiation therapy   . Hyponatremia 04/13/2012  . ETD (eustachian tube dysfunction) 01/07/2012  . Lung cancer 12/19/2011  . Mass of lung 11/22/2011  . COPD (chronic obstructive pulmonary disease) 11/05/2011  . Ascending aortic aneurysm 10/25/2011  . Pleural disorder 10/25/2011  . Colon cancer screening 10/21/2011  . Abnormal chest x-ray 10/20/2011  . Weight loss, non-intentional 10/18/2011  . Other screening mammogram 10/18/2011  . Routine general medical examination at a health care facility 10/12/2011  . Heart murmur 05/12/2011  . UNSPECIFIED ANEMIA 12/15/2009  . HYPERGLYCEMIA 09/02/2008  . HYPOGLYCEMIA, UNSPECIFIED 04/01/2008  . Malignant neoplasm of skin of parts of face 10/17/2006  . HYPERLIPIDEMIA 10/17/2006  . HYPERTENSION 10/17/2006  . ALLERGIC RHINITIS, SEASONAL 10/17/2006  . TOBACCO USE, QUIT 10/17/2006  . CARCINOMA, BASAL CELL, FACE 10/17/2006   Past Medical History  Diagnosis Date  . Hyperlipidemia   .  Hypertension   . History  of basal cell carcinoma   . Light cigarette smoker   . Kidney stone 01/2000    stent. Renal U/S normal in 12/01.  Marland Kitchen Heart murmur   . Arthritis   . Osteoarthritis   . Lung mass October 2013    RUL  . COPD (chronic obstructive pulmonary disease)   . Ascending aortic aneurysm   . Cancer     skin  . Lung cancer 12/19/2011  . Hx of radiation therapy 01/01/12- 02/21/12    right upper chest region, 63 gray, 35 fx   Past Surgical History  Procedure Laterality Date  . Partial hysterectomy  1978    bleeding  . Kidney stone surgery  01/2000    stent  . Colonoscopy    . Video bronchoscopy with endobronchial ultrasound  11/27/2011    Procedure: VIDEO BRONCHOSCOPY WITH ENDOBRONCHIAL ULTRASOUND;  Surgeon: Leslye Peer, MD;  Location: [facility] OR;  Service: Pulmonary;  Laterality: N/A;   History  Substance Use Topics  . Smoking status: Former Smoker -- 25 years    Types: Cigarettes    Quit date: 01/29/2007  . Smokeless tobacco: Not on file  . Alcohol Use: Yes     Comment: occasional   Family History  Problem Relation Age of Onset  . Lung cancer Mother     lung- non smoker  . Hypertension Father   .  Stroke Father     cerebral hemorrhage  . Lung cancer Sister     lung  . Diabetes Son    Allergies  Allergen Reactions  . Ace Inhibitors     Cough    Current Outpatient Prescriptions on File Prior to Visit  Medication Sig Dispense Refill  . amLODipine (NORVASC) 10 MG tablet Take 1 tablet (10 mg total) by mouth daily.  90 tablet  3  . Artificial Tear Ointment (DRY EYES OP) Place 1 drop into both eyes 3 (three) times daily as needed. For dry eyes      . aspirin 81 MG chewable tablet Chew 81 mg by mouth daily.        . Calcium Carbonate-Vitamin D (CALTRATE 600+D) 600-400 MG-UNIT per tablet Take 2 tablets by mouth daily.       . metoprolol succinate (TOPROL-XL) 50 MG 24 hr tablet Take 1 tablet (50 mg total) by mouth daily. Take with or immediately following a meal.   90 tablet  3  . Multiple Vitamins-Minerals (CENTRUM SILVER PO) Take 1 tablet by mouth daily.       Marland Kitchen olmesartan (BENICAR) 40 MG tablet Take 1 tablet (40 mg total) by mouth daily.  90 tablet  0  . simvastatin (ZOCOR) 20 MG tablet Take 1 tablet (20 mg total) by mouth daily.  90 tablet  3   No current facility-administered medications on file prior to visit.    Review of Systems    Review of Systems  Constitutional: Negative for fever, appetite change, fatigue and unexpected weight change.  Eyes: Negative for pain and visual disturbance.  Respiratory: Negative for wheeze and shortness of breath.  - pos for cough that is improved  Cardiovascular: Negative for cp or palpitations    Gastrointestinal: Negative for nausea, diarrhea and constipation.  Genitourinary: Negative for urgency and frequency.neg for dysuria or blood in stool  Skin: Negative for pallor or rash   Neurological: Negative for weakness, light-headedness, numbness and headaches.  Hematological: Negative for adenopathy. Does not bruise/bleed easily.  Psychiatric/Behavioral: Negative for dysphoric mood. The patient is not nervous/anxious.      Objective:   Physical Exam  Constitutional: She appears well-developed and well-nourished. No distress.  Slim nad chronically ill appearing female-in very good spirits   HENT:  Head: Normocephalic and atraumatic.  Right Ear: External ear normal.  Mouth/Throat: Oropharynx is clear and moist. No oropharyngeal exudate.  Eyes: Conjunctivae and EOM are normal. Pupils are equal, round, and reactive to light. No scleral icterus.  Neck: Normal range of motion. Neck supple. No JVD present. Carotid bruit is not present. No thyromegaly present.  Cardiovascular: Normal rate, regular rhythm, normal heart sounds and intact distal pulses.  Exam reveals no gallop.   Pulmonary/Chest: Effort normal and breath sounds normal. No respiratory distress. She has no wheezes. She has no rales. She exhibits no  tenderness.  Diffusely distant bs   Abdominal: Soft. Bowel sounds are normal. She exhibits no distension and no mass. There is no tenderness.  Musculoskeletal: She exhibits no edema and no tenderness.  Lymphadenopathy:    She has no cervical adenopathy.  Neurological: She is alert. She has normal reflexes. No cranial nerve deficit. She exhibits normal muscle tone. Coordination normal.  Skin: Skin is warm and dry. No rash noted. No erythema. No pallor.  Mild pallor  Psychiatric: She has a normal mood and affect.          Assessment & Plan:

## 2012-09-25 NOTE — Telephone Encounter (Signed)
I sent ONC tx request for labs and f/u on the 9/9 and leave chemo on 9/10

## 2012-09-25 NOTE — Telephone Encounter (Signed)
Please let pt know she is anemic with Hb of 8   (was 8.4 coming out of the hospital)- I suspect it is related to her chemotherapy- I will forward copy to her oncologist  Also sodium level is low - please ask if she is taking any diuretic medicines (I do not think so)

## 2012-09-27 NOTE — Assessment & Plan Note (Signed)
In active chemo Recent pnemonia  Pt thinks she is overall doing well  Has mets to liver unfortunately F/u with oncol next week

## 2012-09-27 NOTE — Assessment & Plan Note (Signed)
S/p hosp in pt with lung cancer actively on chemo  Will finish levaquin - at 250 mg dose in light of renal insuff Lab today  Reassuring exam  Rev hosp results/ notes and studies in detail today

## 2012-09-27 NOTE — Assessment & Plan Note (Signed)
tx with levaquin Re check ua /cx today  No symptoms  Pt is on active chemotx for lung cancer

## 2012-09-27 NOTE — Assessment & Plan Note (Signed)
Suspect worsened by chemotx  Per pt- imp after transfusion Lab today May need to disc further tx with oncologist

## 2012-09-28 ENCOUNTER — Ambulatory Visit: Payer: Self-pay | Admitting: Oncology

## 2012-09-29 ENCOUNTER — Inpatient Hospital Stay (HOSPITAL_COMMUNITY)
Admission: EM | Admit: 2012-09-29 | Discharge: 2012-10-03 | DRG: 574 | Disposition: A | Discharge status: Home / Self Care | Payer: BC Managed Care – PPO | Attending: Internal Medicine | Admitting: Internal Medicine

## 2012-09-29 ENCOUNTER — Emergency Department (HOSPITAL_COMMUNITY): Payer: BC Managed Care – PPO

## 2012-09-29 ENCOUNTER — Encounter: Payer: Self-pay | Admitting: Family Medicine

## 2012-09-29 ENCOUNTER — Encounter (HOSPITAL_COMMUNITY): Payer: Self-pay

## 2012-09-29 ENCOUNTER — Ambulatory Visit (INDEPENDENT_AMBULATORY_CARE_PROVIDER_SITE_OTHER): Payer: BC Managed Care – PPO | Admitting: Family Medicine

## 2012-09-29 VITALS — BP 110/54 | HR 81 | Temp 98.5°F | Ht 67.75 in | Wt 116.5 lb

## 2012-09-29 DIAGNOSIS — D6481 Anemia due to antineoplastic chemotherapy: Secondary | ICD-10-CM | POA: Diagnosis present

## 2012-09-29 DIAGNOSIS — R634 Abnormal weight loss: Secondary | ICD-10-CM | POA: Diagnosis present

## 2012-09-29 DIAGNOSIS — E871 Hypo-osmolality and hyponatremia: Secondary | ICD-10-CM

## 2012-09-29 DIAGNOSIS — E43 Unspecified severe protein-calorie malnutrition: Secondary | ICD-10-CM

## 2012-09-29 DIAGNOSIS — E860 Dehydration: Secondary | ICD-10-CM | POA: Diagnosis present

## 2012-09-29 DIAGNOSIS — D638 Anemia in other chronic diseases classified elsewhere: Secondary | ICD-10-CM

## 2012-09-29 DIAGNOSIS — R5381 Other malaise: Secondary | ICD-10-CM | POA: Diagnosis present

## 2012-09-29 DIAGNOSIS — E440 Moderate protein-calorie malnutrition: Secondary | ICD-10-CM

## 2012-09-29 DIAGNOSIS — R112 Nausea with vomiting, unspecified: Secondary | ICD-10-CM | POA: Insufficient documentation

## 2012-09-29 DIAGNOSIS — J449 Chronic obstructive pulmonary disease, unspecified: Secondary | ICD-10-CM

## 2012-09-29 DIAGNOSIS — C44300 Unspecified malignant neoplasm of skin of unspecified part of face: Secondary | ICD-10-CM

## 2012-09-29 DIAGNOSIS — I712 Thoracic aortic aneurysm, without rupture: Secondary | ICD-10-CM

## 2012-09-29 DIAGNOSIS — Z66 Do not resuscitate: Secondary | ICD-10-CM | POA: Diagnosis present

## 2012-09-29 DIAGNOSIS — J189 Pneumonia, unspecified organism: Secondary | ICD-10-CM

## 2012-09-29 DIAGNOSIS — Z87891 Personal history of nicotine dependence: Secondary | ICD-10-CM

## 2012-09-29 DIAGNOSIS — D72829 Elevated white blood cell count, unspecified: Secondary | ICD-10-CM

## 2012-09-29 DIAGNOSIS — D61810 Antineoplastic chemotherapy induced pancytopenia: Secondary | ICD-10-CM | POA: Diagnosis present

## 2012-09-29 DIAGNOSIS — N179 Acute kidney failure, unspecified: Secondary | ICD-10-CM

## 2012-09-29 DIAGNOSIS — D630 Anemia in neoplastic disease: Secondary | ICD-10-CM | POA: Diagnosis present

## 2012-09-29 DIAGNOSIS — D696 Thrombocytopenia, unspecified: Secondary | ICD-10-CM

## 2012-09-29 DIAGNOSIS — D6959 Other secondary thrombocytopenia: Principal | ICD-10-CM | POA: Diagnosis present

## 2012-09-29 DIAGNOSIS — J4489 Other specified chronic obstructive pulmonary disease: Secondary | ICD-10-CM | POA: Diagnosis present

## 2012-09-29 DIAGNOSIS — I7121 Aneurysm of the ascending aorta, without rupture: Secondary | ICD-10-CM

## 2012-09-29 DIAGNOSIS — D649 Anemia, unspecified: Secondary | ICD-10-CM

## 2012-09-29 DIAGNOSIS — C787 Secondary malignant neoplasm of liver and intrahepatic bile duct: Secondary | ICD-10-CM | POA: Diagnosis present

## 2012-09-29 DIAGNOSIS — E785 Hyperlipidemia, unspecified: Secondary | ICD-10-CM

## 2012-09-29 DIAGNOSIS — I10 Essential (primary) hypertension: Secondary | ICD-10-CM

## 2012-09-29 DIAGNOSIS — Z681 Body mass index (BMI) 19 or less, adult: Secondary | ICD-10-CM

## 2012-09-29 DIAGNOSIS — R011 Cardiac murmur, unspecified: Secondary | ICD-10-CM

## 2012-09-29 DIAGNOSIS — T451X5A Adverse effect of antineoplastic and immunosuppressive drugs, initial encounter: Secondary | ICD-10-CM | POA: Diagnosis present

## 2012-09-29 DIAGNOSIS — C3490 Malignant neoplasm of unspecified part of unspecified bronchus or lung: Secondary | ICD-10-CM | POA: Diagnosis present

## 2012-09-29 LAB — URINALYSIS, ROUTINE W REFLEX MICROSCOPIC
Bilirubin Urine: NEGATIVE
Glucose, UA: NEGATIVE mg/dL
Ketones, ur: NEGATIVE mg/dL
Leukocytes, UA: NEGATIVE
Protein, ur: 100 mg/dL — AB

## 2012-09-29 LAB — POCT I-STAT, CHEM 8
Glucose, Bld: 110 mg/dL — ABNORMAL HIGH (ref 70–99)
HCT: 24 % — ABNORMAL LOW (ref 36.0–46.0)
Hemoglobin: 8.2 g/dL — ABNORMAL LOW (ref 12.0–15.0)
Potassium: 3.8 mEq/L (ref 3.5–5.1)

## 2012-09-29 LAB — CBC WITH DIFFERENTIAL/PLATELET
Basophils Absolute: 0 10*3/uL (ref 0.0–0.1)
Basophils Relative: 0 % (ref 0–1)
Eosinophils Absolute: 0 10*3/uL (ref 0.0–0.7)
Eosinophils Relative: 0 % (ref 0–5)
HCT: 23.4 % — ABNORMAL LOW (ref 36.0–46.0)
MCH: 32.5 pg (ref 26.0–34.0)
MCHC: 34.6 g/dL (ref 30.0–36.0)
MCV: 94 fL (ref 78.0–100.0)
Monocytes Absolute: 1.1 10*3/uL — ABNORMAL HIGH (ref 0.1–1.0)
RDW: 13.8 % (ref 11.5–15.5)

## 2012-09-29 LAB — URINE MICROSCOPIC-ADD ON

## 2012-09-29 LAB — LIPASE, BLOOD: Lipase: 12 U/L (ref 11–59)

## 2012-09-29 MED ORDER — SODIUM CHLORIDE 0.9 % IV BOLUS (SEPSIS)
500.0000 mL | Freq: Once | INTRAVENOUS | Status: AC
  Administered 2012-09-29: 500 mL via INTRAVENOUS

## 2012-09-29 NOTE — ED Provider Notes (Signed)
CSN: [id number]     Arrival date & time 09/29/12  1739 History   First MD Initiated Contact with Patient 09/29/12 2007     Chief Complaint  Patient presents with  . Emesis   (Consider location/radiation/quality/duration/timing/severity/associated sxs/prior Treatment) Patient is a 67 y.o. female presenting with vomiting. The history is provided by the patient.  Emesis Severity:  Moderate Associated symptoms: chills   Associated symptoms: no abdominal pain, no diarrhea and no headaches    patient's had nausea and vomiting for the last few days. She is on chemotherapy for metastatic lung cancer. She was admitted to [facility] hospital last weekend and was reportedly treated for pneumonia. She's currently on Levaquin. She states she began to have some chills again. No clear fevers. She states she is vomiting stomach contents. No diarrhea. Minimal abdominal pain. No dysuria. She does not have a Port-A-Cath. No sore throat. She has completed a week of a new chemotherapy regimen that she's not had previously. Her primary care doctor sent her N. She had anemia and thrombocytopenia on lab work drawn 2 days ago.  Past Medical History  Diagnosis Date  . Hyperlipidemia   . Hypertension   . History  of basal cell carcinoma   . Light cigarette smoker   . Kidney stone 01/2000    stent. Renal U/S normal in 12/01.  Marland Kitchen Heart murmur   . Arthritis   . Osteoarthritis   . Lung mass October 2013    RUL  . COPD (chronic obstructive pulmonary disease)   . Ascending aortic aneurysm   . Cancer     skin  . Lung cancer 12/19/2011  . Hx of radiation therapy 01/01/12- 02/21/12    right upper chest region, 63 gray, 35 fx   Past Surgical History  Procedure Laterality Date  . Partial hysterectomy  1978    bleeding  . Kidney stone surgery  01/2000    stent  . Colonoscopy    . Video bronchoscopy with endobronchial ultrasound  11/27/2011    Procedure: VIDEO BRONCHOSCOPY WITH ENDOBRONCHIAL ULTRASOUND;  Surgeon: Leslye Peer, MD;  Location: [facility] OR;  Service: Pulmonary;  Laterality: N/A;   Family History  Problem Relation Age of Onset  . Lung cancer Mother     lung- non smoker  . Hypertension Father   . Stroke Father     cerebral hemorrhage  . Lung cancer Sister     lung  . Diabetes Son    History  Substance Use Topics  . Smoking status: Former Smoker -- 25 years    Types: Cigarettes    Quit date: 01/29/2007  . Smokeless tobacco: Not on file  . Alcohol Use: Yes     Comment: occasional   OB History   Grav Para Term Preterm Abortions TAB SAB Ect Mult Living                 Review of Systems  Constitutional: Positive for chills, appetite change and fatigue. Negative for fever and activity change.  HENT: Negative for neck stiffness.   Eyes: Negative for pain.  Respiratory: Negative for chest tightness and shortness of breath.   Cardiovascular: Negative for chest pain and leg swelling.  Gastrointestinal: Positive for nausea and vomiting. Negative for abdominal pain and diarrhea.  Genitourinary: Negative for flank pain.  Musculoskeletal: Negative for back pain.  Skin: Negative for rash.  Neurological: Negative for weakness, numbness and headaches.  Psychiatric/Behavioral: Negative for behavioral problems.    Allergies  Ace inhibitors  Home Medications   Current Outpatient Rx  Name  Route  Sig  Dispense  Refill  . amLODipine (NORVASC) 10 MG tablet   Oral   Take 1 tablet (10 mg total) by mouth daily.   90 tablet   3   . Artificial Tear Ointment (DRY EYES OP)   Both Eyes   Place 1 drop into both eyes 3 (three) times daily as needed (for dry eyes).          Marland Kitchen aspirin 81 MG chewable tablet   Oral   Chew 81 mg by mouth daily.           . Calcium Carbonate-Vitamin D (CALTRATE 600+D) 600-400 MG-UNIT per tablet   Oral   Take 2 tablets by mouth daily.          Marland Kitchen levofloxacin (LEVAQUIN) 500 MG tablet   Oral   Take 250 mg by mouth daily. 7 day course of therapy started  09/27/12         . metoprolol succinate (TOPROL-XL) 50 MG 24 hr tablet   Oral   Take 1 tablet (50 mg total) by mouth daily. Take with or immediately following a meal.   90 tablet   3   . Multiple Vitamin (MULTIVITAMIN WITH MINERALS) TABS tablet   Oral   Take 1 tablet by mouth daily.         Marland Kitchen olmesartan (BENICAR) 40 MG tablet   Oral   Take 1 tablet (40 mg total) by mouth daily.   90 tablet   0   . PRESCRIPTION MEDICATION   Intravenous   Inject into the vein as directed. Gemzar and Paraplatin infusion on weeks 1 and 2 of 3 week cycle.         . simvastatin (ZOCOR) 20 MG tablet   Oral   Take 1 tablet (20 mg total) by mouth daily.   90 tablet   3   . traMADol (ULTRAM) 50 MG tablet   Oral   Take 1 tablet (50 mg total) by mouth every 8 (eight) hours as needed for pain.   30 tablet   1    BP 157/60  Pulse 83  Temp(Src) 98.8 F (37.1 C) (Oral)  Resp 14  SpO2 94%  LMP 01/29/1976 Physical Exam  Nursing note and vitals reviewed. Constitutional: She is oriented to person, place, and time. She appears well-developed and well-nourished.  HENT:  Head: Normocephalic and atraumatic.  Eyes: EOM are normal. Pupils are equal, round, and reactive to light.  Neck: Normal range of motion. Neck supple.  Cardiovascular: Normal rate, regular rhythm and normal heart sounds.   No murmur heard. Pulmonary/Chest: Effort normal. No respiratory distress. She has no wheezes. She has no rales.  Mildly harsh breath sounds  Abdominal: Soft. Bowel sounds are normal. She exhibits no distension. There is no tenderness. There is no rebound and no guarding.  Musculoskeletal: Normal range of motion.  Neurological: She is alert and oriented to person, place, and time. No cranial nerve deficit.  Skin: Skin is warm and dry. There is pallor.  Psychiatric: She has a normal mood and affect. Her speech is normal.    ED Course  Procedures (including critical care time) Labs Review Labs Reviewed   URINALYSIS, ROUTINE W REFLEX MICROSCOPIC - Abnormal; Notable for the following:    Hgb urine dipstick SMALL (*)    Protein, ur 100 (*)    All other components within normal limits  CBC WITH DIFFERENTIAL - Abnormal;  Notable for the following:    WBC 11.8 (*)    RBC 2.49 (*)    Hemoglobin 8.1 (*)    HCT 23.4 (*)    Platelets 21 (*)    Neutrophils Relative % 86 (*)    Neutro Abs 10.2 (*)    Lymphocytes Relative 4 (*)    Lymphs Abs 0.4 (*)    Monocytes Absolute 1.1 (*)    All other components within normal limits  POCT I-STAT, CHEM 8 - Abnormal; Notable for the following:    Sodium 127 (*)    Chloride 92 (*)    Creatinine, Ser 1.50 (*)    Glucose, Bld 110 (*)    Calcium, Ion 0.98 (*)    Hemoglobin 8.2 (*)    HCT 24.0 (*)    All other components within normal limits  CULTURE, BLOOD (ROUTINE X 2)  CULTURE, BLOOD (ROUTINE X 2)  URINE MICROSCOPIC-ADD ON  LIPASE, BLOOD   Imaging Review Dg Chest 2 View  09/29/2012   *RADIOLOGY REPORT*  Clinical Data: Fever.  Smoker.  CHEST - 2 VIEW  Comparison: Chest CT 08/26/2012.  Chest x-ray 12/10/2011  Findings: COPD.  Apical pleural and parenchymal scarring bilaterally is stable.  Cardiac enlargement without heart failure.  Negative for pneumonia or effusion.  IMPRESSION: COPD with apical scarring.  No acute abnormality.   Original Report Authenticated By: Janeece Riggers, M.D.    MDM   1. Nausea with vomiting   2. Anemia   3. Thrombocytopenia   4. Hyponatremia   5. Lung cancer, unspecified laterality    Patient with nausea and vomiting on chemotherapy. She has anemia that is stable from 2 days ago but also had a recent transfusion. She has had increasing thrombocytopenia. She is hyponatremic. She's also likely volume depleted. She was given IV fluids. She'll be admitted to internal medicine.    Juliet Rude. Rubin Payor, MD 09/29/12 [phone]

## 2012-09-29 NOTE — Progress Notes (Signed)
Subjective:    Patient ID: LORNA STROTHER, female    DOB: Aug 08, 1945, 67 y.o.   MRN: [id number]  HPI Here feeling sick  Over the weekend - vomits anything she eats and drinks   Cannot keep much down at all - can take sips of water  bp is lower  Not too dizzy however   ucx -neg    Chemistry      Component Value Date/Time   NA 129* 09/25/2012 0922   NA 137 09/16/2012 0913   K 4.1 09/25/2012 0922   K 4.1 09/16/2012 0913   CL 99 09/25/2012 0922   CL 103 05/27/2012 0854   CO2 22 09/25/2012 0922   CO2 24 09/16/2012 0913   BUN 21 09/25/2012 0922   BUN 22.1 09/16/2012 0913   CREATININE 1.4* 09/25/2012 0922   CREATININE 1.4* 09/16/2012 0913      Component Value Date/Time   CALCIUM 8.8 09/25/2012 0922   CALCIUM 9.2 09/16/2012 0913   ALKPHOS 748* 09/16/2012 0913   ALKPHOS 100 11/26/2011 1430   AST 62* 09/16/2012 0913   AST 25 11/26/2011 1430   ALT 51 09/16/2012 0913   ALT 17 11/26/2011 1430   BILITOT 0.20 09/16/2012 0913   BILITOT 0.1* 11/26/2011 1430      Lab Results  Component Value Date   WBC 13.1* 09/25/2012   HGB 8.0 Repeated and verified X2.* 09/25/2012   HCT 23.7 Repeated and verified X2.* 09/25/2012   MCV 97.0 09/25/2012   PLT 36.0 Repeated and verified X2.* 09/25/2012      Patient Active Problem List   Diagnosis Date Noted  . Nausea with vomiting 09/29/2012  . Pneumonia 09/25/2012  . UTI (urinary tract infection) 09/25/2012  . Anemia 09/25/2012  . Hx of radiation therapy   . Hyponatremia 04/13/2012  . ETD (eustachian tube dysfunction) 01/07/2012  . Lung cancer 12/19/2011  . Mass of lung 11/22/2011  . COPD (chronic obstructive pulmonary disease) 11/05/2011  . Ascending aortic aneurysm 10/25/2011  . Pleural disorder 10/25/2011  . Colon cancer screening 10/21/2011  . Abnormal chest x-ray 10/20/2011  . Weight loss, non-intentional 10/18/2011  . Other screening mammogram 10/18/2011  . Routine general medical examination at a health care facility 10/12/2011  . Heart murmur  05/12/2011  . UNSPECIFIED ANEMIA 12/15/2009  . HYPERGLYCEMIA 09/02/2008  . HYPOGLYCEMIA, UNSPECIFIED 04/01/2008  . Malignant neoplasm of skin of parts of face 10/17/2006  . HYPERLIPIDEMIA 10/17/2006  . HYPERTENSION 10/17/2006  . ALLERGIC RHINITIS, SEASONAL 10/17/2006  . TOBACCO USE, QUIT 10/17/2006  . CARCINOMA, BASAL CELL, FACE 10/17/2006   Past Medical History  Diagnosis Date  . Hyperlipidemia   . Hypertension   . History  of basal cell carcinoma   . Light cigarette smoker   . Kidney stone 01/2000    stent. Renal U/S normal in 12/01.  Marland Kitchen Heart murmur   . Arthritis   . Osteoarthritis   . Lung mass October 2013    RUL  . COPD (chronic obstructive pulmonary disease)   . Ascending aortic aneurysm   . Cancer     skin  . Lung cancer 12/19/2011  . Hx of radiation therapy 01/01/12- 02/21/12    right upper chest region, 63 gray, 35 fx   Past Surgical History  Procedure Laterality Date  . Partial hysterectomy  1978    bleeding  . Kidney stone surgery  01/2000    stent  . Colonoscopy    . Video bronchoscopy with endobronchial ultrasound  11/27/2011  Procedure: VIDEO BRONCHOSCOPY WITH ENDOBRONCHIAL ULTRASOUND;  Surgeon: Leslye Peer, MD;  Location: [facility] OR;  Service: Pulmonary;  Laterality: N/A;   History  Substance Use Topics  . Smoking status: Former Smoker -- 25 years    Types: Cigarettes    Quit date: 01/29/2007  . Smokeless tobacco: Not on file  . Alcohol Use: Yes     Comment: occasional   Family History  Problem Relation Age of Onset  . Lung cancer Mother     lung- non smoker  . Hypertension Father   . Stroke Father     cerebral hemorrhage  . Lung cancer Sister     lung  . Diabetes Son    Allergies  Allergen Reactions  . Ace Inhibitors     Cough    No current facility-administered medications on file prior to visit.   Current Outpatient Prescriptions on File Prior to Visit  Medication Sig Dispense Refill  . amLODipine (NORVASC) 10 MG tablet Take 1  tablet (10 mg total) by mouth daily.  90 tablet  3  . Artificial Tear Ointment (DRY EYES OP) Place 1 drop into both eyes 3 (three) times daily as needed (for dry eyes).       Marland Kitchen aspirin 81 MG chewable tablet Chew 81 mg by mouth daily.        . Calcium Carbonate-Vitamin D (CALTRATE 600+D) 600-400 MG-UNIT per tablet Take 2 tablets by mouth daily.       Marland Kitchen levofloxacin (LEVAQUIN) 500 MG tablet Take 250 mg by mouth daily. 7 day course of therapy started 09/27/12      . metoprolol succinate (TOPROL-XL) 50 MG 24 hr tablet Take 1 tablet (50 mg total) by mouth daily. Take with or immediately following a meal.  90 tablet  3  . olmesartan (BENICAR) 40 MG tablet Take 1 tablet (40 mg total) by mouth daily.  90 tablet  0  . simvastatin (ZOCOR) 20 MG tablet Take 1 tablet (20 mg total) by mouth daily.  90 tablet  3  . traMADol (ULTRAM) 50 MG tablet Take 1 tablet (50 mg total) by mouth every 8 (eight) hours as needed for pain.  30 tablet  1    Review of Systems Review of Systems  Constitutional: Negative for fever, appetite change, pos for fatigue and weight loss .  Eyes: Negative for pain and visual disturbance.  Respiratory: Negative for cough and shortness of breath.  (this has improved) Cardiovascular: Negative for cp or palpitations    Gastrointestinal: Negative for diarrhea and constipation. or abd pain/ pos for n/v Genitourinary: Negative for urgency and frequency. neg for dysuria (recent neg urine cx) Skin: Negative for pallor or rash   Neurological: Negative for weakness, , numbness and headaches. pos for light headedness  Hematological: Negative for adenopathy. Does not bruise/bleed easily.  Psychiatric/Behavioral: Negative for dysphoric mood. The patient is not nervous/anxious.         Objective:   Physical Exam  Constitutional: She appears well-developed and well-nourished. No distress.  HENT:  Head: Normocephalic and atraumatic.  Mouth/Throat: Oropharynx is clear and moist.  Eyes: EOM are  normal. Pupils are equal, round, and reactive to light. No scleral icterus.  Pale conjunctivae  Neck: Normal range of motion. Neck supple. No JVD present. Carotid bruit is not present. No thyromegaly present.  Cardiovascular: Normal rate and regular rhythm.  Exam reveals no gallop.   Murmur heard. Pulmonary/Chest: Effort normal and breath sounds normal. No respiratory distress. She has no wheezes.  She has no rales.  Diffusely distant bs   Abdominal: Soft. Bowel sounds are normal. She exhibits no distension. There is no tenderness. There is no rebound.  Musculoskeletal: She exhibits no edema.  Lymphadenopathy:    She has no cervical adenopathy.  Neurological: She is alert. She has normal reflexes.  Skin: Skin is warm and dry. No rash noted. No erythema. There is pallor.  Sluggish capillary refill  Psychiatric: She has a normal mood and affect.          Assessment & Plan:

## 2012-09-29 NOTE — ED Notes (Signed)
Pt states sent here from PCP d/o abnormal labs, pt c/o vomiting x1wk; pt hx lung CA

## 2012-09-29 NOTE — Patient Instructions (Addendum)
Go to the ER at [facility] for acute evaluation Your sodium level was low- not with vomiting it is probably lower  Anemia is also a concern as is kidney function I suspect you will need IV fluids , maybe a blood trasfusion

## 2012-09-29 NOTE — Assessment & Plan Note (Signed)
Pt with lung ca/ mets to liver- on chemo with n/v over weekend  Baseline na was 129- is worrisome now with continued n/v Also cr of 1.4  She needs IVF at the bare minimum , and bp is decreasing gradually Sent to [facility] ER for eval and tx

## 2012-09-29 NOTE — Telephone Encounter (Signed)
Discussed with pt directly at acute appt today

## 2012-09-29 NOTE — Assessment & Plan Note (Signed)
Hb of 8 in chemotx pt - with weakness that is inc and inability to keep down food or fluids  Will likely need transfusion Also dehydrated/ hyponatremic Sent to [facility] ER for eval and tx

## 2012-09-29 NOTE — H&P (Signed)
[facility] History and Physical  Pamela Levine ZOX:[id number] DOB: Aug 26, 1945    PCP:   Pamela Manns, MD   Chief Complaint: nausea, vomiting, chills, and weakness.  HPI: Pamela Levine is an 67 y.o. female with hx of lung ca with mets to liver, HTN, hyperlipidemia, ascending aortic aneurysm and heart murmur, undergoing chemotherapy, recently Dx with PNA on Levoquin 250mg  per day, anemia and thrombocytopenia felt to be chemothx related, sent to the ER by PCP Dr Pamela Levine for hyponatremia Na=129, anemia Hb of 8 g/DL, and thrombocytopenia Plt 21K, unable to tolerate PO well with constant nausea and vomiting.  She has no black or bloody stool.  Evaluation in the ER showed the above labs, and hospitalist was asked to admit her for symptomatic Tx of weakness, nausea, vomiting, and to follow labs.  Rewiew of Systems:  Constitutional: Negative for malaise.  No weight gain Eyes: Negative for eye pain, redness and discharge, diplopia, visual changes, or flashes of light. ENMT: Negative for ear pain, hoarseness, nasal congestion, sinus pressure and sore throat. No headaches; tinnitus, drooling, or problem swallowing. Cardiovascular: Negative for chest pain, palpitations, diaphoresis, dyspnea and peripheral edema. ; No orthopnea, PND Respiratory: Negative for hemoptysis, wheezing and stridor. No pleuritic chestpain. Gastrointestinal: Negative for vomiting, diarrhea, constipation, abdominal pain, melena, blood in stool, hematemesis, jaundice and rectal bleeding.    Genitourinary: Negative for frequency, dysuria, incontinence,flank pain and hematuria; Musculoskeletal: Negative for back pain and neck pain. Negative for swelling and trauma.;  Skin: . Negative for pruritus, rash, abrasions, bruising and skin lesion.; ulcerations Neuro: Negative for headache, lightheadedness and neck stiffness. Negative for altered level of consciousness , altered mental status, extremity weakness, burning feet,  involuntary movement, seizure and syncope.  Psych: negative for anxiety, depression, insomnia, tearfulness, panic attacks, hallucinations, paranoia, suicidal or homicidal ideation    Past Medical History  Diagnosis Date  . Hyperlipidemia   . Hypertension   . History  of basal cell carcinoma   . Light cigarette smoker   . Kidney stone 01/2000    stent. Renal U/S normal in 12/01.  Marland Kitchen Heart murmur   . Arthritis   . Osteoarthritis   . Lung mass October 2013    RUL  . COPD (chronic obstructive pulmonary disease)   . Ascending aortic aneurysm   . Cancer     skin  . Lung cancer 12/19/2011  . Hx of radiation therapy 01/01/12- 02/21/12    right upper chest region, 63 gray, 35 fx    Past Surgical History  Procedure Laterality Date  . Partial hysterectomy  1978    bleeding  . Kidney stone surgery  01/2000    stent  . Colonoscopy    . Video bronchoscopy with endobronchial ultrasound  11/27/2011    Procedure: VIDEO BRONCHOSCOPY WITH ENDOBRONCHIAL ULTRASOUND;  Surgeon: Leslye Peer, MD;  Location: [facility] OR;  Service: Pulmonary;  Laterality: N/A;    Medications:  HOME MEDS: Prior to Admission medications   Medication Sig Start Date End Date Taking? Authorizing Provider  amLODipine (NORVASC) 10 MG tablet Take 1 tablet (10 mg total) by mouth daily. 03/11/12 03/11/13 Yes Judy Pimple, MD  Artificial Tear Ointment (DRY EYES OP) Place 1 drop into both eyes 3 (three) times daily as needed (for dry eyes).    Yes Historical Provider, MD  aspirin 81 MG chewable tablet Chew 81 mg by mouth daily.     Yes Historical Provider, MD  Calcium Carbonate-Vitamin D (CALTRATE 600+D) 600-400 MG-UNIT per  tablet Take 2 tablets by mouth daily.    Yes Historical Provider, MD  levofloxacin (LEVAQUIN) 500 MG tablet Take 250 mg by mouth daily. 7 day course of therapy started 09/27/12   Yes Historical Provider, MD  metoprolol succinate (TOPROL-XL) 50 MG 24 hr tablet Take 1 tablet (50 mg total) by mouth daily. Take with or  immediately following a meal. 03/11/12  Yes Judy Pimple, MD  Multiple Vitamin (MULTIVITAMIN WITH MINERALS) TABS tablet Take 1 tablet by mouth daily.   Yes Historical Provider, MD  olmesartan (BENICAR) 40 MG tablet Take 1 tablet (40 mg total) by mouth daily. 09/15/12  Yes Judy Pimple, MD  PRESCRIPTION MEDICATION Inject into the vein as directed. Gemzar and Paraplatin infusion on weeks 1 and 2 of 3 week cycle.   Yes Historical Provider, MD  simvastatin (ZOCOR) 20 MG tablet Take 1 tablet (20 mg total) by mouth daily. 03/11/12  Yes Judy Pimple, MD  traMADol (ULTRAM) 50 MG tablet Take 1 tablet (50 mg total) by mouth every 8 (eight) hours as needed for pain. 09/25/12  Yes Judy Pimple, MD     Allergies:  Allergies  Allergen Reactions  . Ace Inhibitors     Cough     Social History:   reports that she quit smoking about 5 years ago. Her smoking use included Cigarettes. She smoked 0.00 packs per day for 25 years. She does not have any smokeless tobacco history on file. She reports that  drinks alcohol. She reports that she does not use illicit drugs.  Family History: Family History  Problem Relation Age of Onset  . Lung cancer Mother     lung- non smoker  . Hypertension Father   . Stroke Father     cerebral hemorrhage  . Lung cancer Sister     lung  . Diabetes Son      Physical Exam: Filed Vitals:   09/29/12 1752 09/29/12 2036  BP: 129/58 157/60  Pulse: 86 83  Temp: 98.6 F (37 C) 98.8 F (37.1 C)  TempSrc: Oral Oral  Resp: 16 14  SpO2: 100% 94%   Blood pressure 157/60, pulse 83, temperature 98.8 F (37.1 C), temperature source Oral, resp. rate 14, last menstrual period 01/29/1976, SpO2 94.00%.  GEN:  Pleasant  patient lying in the stretcher in no acute distress; cooperative with exam. PSYCH:  alert and oriented x4; does not appear anxious or depressed; affect is appropriate. HEENT: Mucous membranes pink and anicteric; PERRLA; EOM intact; no cervical lymphadenopathy nor  thyromegaly or carotid bruit; no JVD; There were no stridor. Neck is very supple. Breasts:: Not examined CHEST WALL: No tenderness CHEST: Normal respiration, clear to auscultation bilaterally.  HEART: Regular rate and rhythm.  There are no murmur, rub, or gallops.   BACK: No kyphosis or scoliosis; no CVA tenderness ABDOMEN: soft and non-tender; no masses, no organomegaly, normal abdominal bowel sounds; no pannus; no intertriginous candida. There is no rebound and no distention. Rectal Exam: Not done EXTREMITIES: No bone or joint deformity; age-appropriate arthropathy of the hands and knees; no edema; no ulcerations.  There is no calf tenderness. Genitalia: not examined PULSES: 2+ and symmetric SKIN: Normal hydration no rash or ulceration. Some bruising at the IV sites. CNS: Cranial nerves 2-12 grossly intact no focal lateralizing neurologic deficit.  Speech is fluent; uvula elevated with phonation, facial symmetry and tongue midline. DTR are normal bilaterally, cerebella exam is intact, barbinski is negative and strengths are equaled bilaterally.  No  sensory loss.   Labs on Admission:  Basic Metabolic Panel:  Recent Labs Lab 09/25/12 0922 09/29/12 1852  NA 129* 127*  K 4.1 3.8  CL 99 92*  CO2 22  --   GLUCOSE 107* 110*  BUN 21 23  CREATININE 1.4* 1.50*  CALCIUM 8.8  --    Liver Function Tests: No results found for this basename: AST, ALT, ALKPHOS, BILITOT, PROT, ALBUMIN,  in the last 168 hours  Recent Labs Lab 09/29/12 1846  LIPASE 12   No results found for this basename: AMMONIA,  in the last 168 hours CBC:  Recent Labs Lab 09/25/12 0922 09/29/12 1846 09/29/12 1852  WBC 13.1* 11.8*  --   NEUTROABS 12.3* 10.2*  --   HGB 8.0 Repeated and verified X2.* 8.1* 8.2*  HCT 23.7 Repeated and verified X2.* 23.4* 24.0*  MCV 97.0 94.0  --   PLT 36.0 Repeated and verified X2.* 21*  --    Cardiac Enzymes: No results found for this basename: CKTOTAL, CKMB, CKMBINDEX, TROPONINI,   in the last 168 hours  CBG: No results found for this basename: GLUCAP,  in the last 168 hours   Radiological Exams on Admission: Dg Chest 2 View  09/29/2012   *RADIOLOGY REPORT*  Clinical Data: Fever.  Smoker.  CHEST - 2 VIEW  Comparison: Chest CT 08/26/2012.  Chest x-ray 12/10/2011  Findings: COPD.  Apical pleural and parenchymal scarring bilaterally is stable.  Cardiac enlargement without heart failure.  Negative for pneumonia or effusion.  IMPRESSION: COPD with apical scarring.  No acute abnormality.   Original Report Authenticated By: Janeece Riggers, M.D.   Assessment/Plan Present on Admission:  . Dehydration . Anemia . Heart murmur . Hyponatremia . Lung cancer . Pneumonia . Weight loss, non-intentional . COPD (chronic obstructive pulmonary disease) . HYPERTENSION . HYPERLIPIDEMIA  PLAN:  Will admit her for dehydration, weakness, hyponatremia, and possible PNA.  She was given lower dose of Levaquin as she had liver mets, but I will give her full IV dose as she may still have PNA.  She will be given NS for hyponatremia.  Will follow Na carefully.  For her anemia and thrombocytopenia, will follow.  These are likely from chemotherapy.  I have stopped her statin along with her ARB for now.  She does have elevated Cr in the setting of volume depletion.  I also had a chance to discuss her code status with both her and her husband, and she would like to be a DNR.  Will honor her wishes.  She is stable and will be admitted to [facility] service.  Thank you for allowing me to participate in the care of your nice patient.  Other plans as per orders.  Code Status: DNR   Houston Siren, MD. [facility] Pager [phone] 7pm to 7am.  09/29/2012, 11:10 PM

## 2012-09-30 ENCOUNTER — Other Ambulatory Visit: Payer: BC Managed Care – PPO

## 2012-09-30 DIAGNOSIS — E43 Unspecified severe protein-calorie malnutrition: Secondary | ICD-10-CM | POA: Diagnosis present

## 2012-09-30 DIAGNOSIS — J189 Pneumonia, unspecified organism: Secondary | ICD-10-CM

## 2012-09-30 DIAGNOSIS — D72829 Elevated white blood cell count, unspecified: Secondary | ICD-10-CM | POA: Diagnosis present

## 2012-09-30 DIAGNOSIS — E440 Moderate protein-calorie malnutrition: Secondary | ICD-10-CM | POA: Diagnosis present

## 2012-09-30 DIAGNOSIS — D61810 Antineoplastic chemotherapy induced pancytopenia: Secondary | ICD-10-CM | POA: Diagnosis present

## 2012-09-30 DIAGNOSIS — D638 Anemia in other chronic diseases classified elsewhere: Secondary | ICD-10-CM

## 2012-09-30 DIAGNOSIS — N179 Acute kidney failure, unspecified: Secondary | ICD-10-CM | POA: Diagnosis present

## 2012-09-30 LAB — COMPREHENSIVE METABOLIC PANEL
ALT: 71 U/L — ABNORMAL HIGH (ref 0–35)
AST: 76 U/L — ABNORMAL HIGH (ref 0–37)
Albumin: 1.8 g/dL — ABNORMAL LOW (ref 3.5–5.2)
Alkaline Phosphatase: 1348 U/L — ABNORMAL HIGH (ref 39–117)
Potassium: 3.9 mEq/L (ref 3.5–5.1)
Sodium: 125 mEq/L — ABNORMAL LOW (ref 135–145)
Total Protein: 5.7 g/dL — ABNORMAL LOW (ref 6.0–8.3)

## 2012-09-30 LAB — CBC
Hemoglobin: 6.9 g/dL — CL (ref 12.0–15.0)
MCHC: 35.6 g/dL (ref 30.0–36.0)
RDW: 14 % (ref 11.5–15.5)
WBC: 12.9 10*3/uL — ABNORMAL HIGH (ref 4.0–10.5)

## 2012-09-30 LAB — HEMOGLOBIN AND HEMATOCRIT, BLOOD
HCT: 23.2 % — ABNORMAL LOW (ref 36.0–46.0)
Hemoglobin: 7.9 g/dL — ABNORMAL LOW (ref 12.0–15.0)

## 2012-09-30 LAB — PREPARE RBC (CROSSMATCH)

## 2012-09-30 MED ORDER — ONDANSETRON 8 MG/NS 50 ML IVPB
8.0000 mg | Freq: Four times a day (QID) | INTRAVENOUS | Status: DC | PRN
  Administered 2012-09-30 (×2): 8 mg via INTRAVENOUS
  Filled 2012-09-30 (×4): qty 8

## 2012-09-30 MED ORDER — ONDANSETRON HCL 4 MG PO TABS: 4.0000 mg | ORAL_TABLET | Freq: Four times a day (QID) | ORAL | Status: DC | PRN

## 2012-09-30 MED ORDER — TRAMADOL HCL 50 MG PO TABS
50.0000 mg | ORAL_TABLET | Freq: Three times a day (TID) | ORAL | Status: DC | PRN
  Administered 2012-09-30 – 2012-10-02 (×4): 50 mg via ORAL
  Filled 2012-09-30 (×5): qty 1

## 2012-09-30 MED ORDER — ONDANSETRON HCL 4 MG/2ML IJ SOLN
4.0000 mg | Freq: Four times a day (QID) | INTRAMUSCULAR | Status: DC | PRN
  Administered 2012-09-30: 4 mg via INTRAVENOUS
  Filled 2012-09-30: qty 2

## 2012-09-30 MED ORDER — SIMVASTATIN 20 MG PO TABS
20.0000 mg | ORAL_TABLET | Freq: Every evening | ORAL | Status: DC
  Administered 2012-09-30 – 2012-10-02 (×3): 20 mg via ORAL
  Filled 2012-09-30 (×4): qty 1

## 2012-09-30 MED ORDER — ADULT MULTIVITAMIN W/MINERALS CH
1.0000 | ORAL_TABLET | Freq: Every day | ORAL | Status: DC
  Administered 2012-09-30 – 2012-10-03 (×4): 1 via ORAL
  Filled 2012-09-30 (×4): qty 1

## 2012-09-30 MED ORDER — ARTIFICIAL TEARS OP OINT
1.0000 "application " | TOPICAL_OINTMENT | Freq: Three times a day (TID) | OPHTHALMIC | Status: DC
  Filled 2012-09-30: qty 3.5

## 2012-09-30 MED ORDER — KCL IN DEXTROSE-NACL 20-5-0.9 MEQ/L-%-% IV SOLN
INTRAVENOUS | Status: DC
  Administered 2012-09-30 – 2012-10-01 (×4): via INTRAVENOUS
  Administered 2012-10-01: 1000 mL via INTRAVENOUS
  Administered 2012-10-02 (×3): via INTRAVENOUS
  Filled 2012-09-30 (×11): qty 1000

## 2012-09-30 MED ORDER — CALCIUM CARBONATE-VITAMIN D 500-200 MG-UNIT PO TABS
2.0000 | ORAL_TABLET | Freq: Every day | ORAL | Status: DC
  Administered 2012-09-30 – 2012-10-03 (×4): 2 via ORAL
  Filled 2012-09-30 (×4): qty 2

## 2012-09-30 MED ORDER — METOPROLOL SUCCINATE ER 50 MG PO TB24
50.0000 mg | ORAL_TABLET | Freq: Every day | ORAL | Status: DC
  Administered 2012-09-30 – 2012-10-03 (×4): 50 mg via ORAL
  Filled 2012-09-30 (×4): qty 1

## 2012-09-30 MED ORDER — MORPHINE SULFATE 2 MG/ML IJ SOLN: 1.0000 mg | INTRAMUSCULAR | Status: DC | PRN

## 2012-09-30 MED ORDER — LEVOFLOXACIN IN D5W 750 MG/150ML IV SOLN
750.0000 mg | INTRAVENOUS | Status: DC
  Administered 2012-09-30: 750 mg via INTRAVENOUS
  Filled 2012-09-30: qty 150

## 2012-09-30 MED ORDER — ASPIRIN 81 MG PO CHEW
81.0000 mg | CHEWABLE_TABLET | Freq: Every day | ORAL | Status: DC
  Administered 2012-09-30 – 2012-10-03 (×4): 81 mg via ORAL
  Filled 2012-09-30 (×4): qty 1

## 2012-09-30 MED ORDER — BOOST / RESOURCE BREEZE PO LIQD
1.0000 | Freq: Two times a day (BID) | ORAL | Status: DC
  Administered 2012-09-30 – 2012-10-03 (×3): 1 via ORAL

## 2012-09-30 MED ORDER — LEVOFLOXACIN IN D5W 750 MG/150ML IV SOLN
750.0000 mg | INTRAVENOUS | Status: DC
  Administered 2012-10-02: 750 mg via INTRAVENOUS
  Filled 2012-09-30: qty 150

## 2012-09-30 MED ORDER — LORAZEPAM 1 MG PO TABS: 1.0000 mg | ORAL_TABLET | Freq: Three times a day (TID) | ORAL | Status: DC | PRN

## 2012-09-30 MED ORDER — IRBESARTAN 300 MG PO TABS
300.0000 mg | ORAL_TABLET | Freq: Every day | ORAL | Status: DC
  Administered 2012-09-30 – 2012-10-03 (×4): 300 mg via ORAL
  Filled 2012-09-30 (×4): qty 1

## 2012-09-30 MED ORDER — AMLODIPINE BESYLATE 10 MG PO TABS
10.0000 mg | ORAL_TABLET | Freq: Every day | ORAL | Status: DC
  Administered 2012-09-30 – 2012-10-03 (×4): 10 mg via ORAL
  Filled 2012-09-30 (×4): qty 1

## 2012-09-30 NOTE — Progress Notes (Signed)
INITIAL NUTRITION ASSESSMENT  Pt meets criteria for severe MALNUTRITION in the context of chronic illness as evidenced by <75% estimated energy intake in the past month in addition to pt with visible severe muscle wasting and subcutaneous fat loss in legs and upper arms.  DOCUMENTATION CODES Per approved criteria  -Severe malnutrition in the context of chronic illness -Underweight   INTERVENTION: - Resource Breeze BID as pt does not like Ensure/Boost/Carnation Instant Breakfast - Anti-emetics per MD - Diet advancement per MD - Will continue to monitor   NUTRITION DIAGNOSIS: Inadequate oral intake related to nausea as evidenced by pt report.   Goal: 1. Resolution of nausea 2. Advance diet as tolerated to regular diet  Monitor:  Weights, labs, intake, nausea   Reason for Assessment: Underweight, consult   67 y.o. female  Admitting Dx: Nausea with vomiting  ASSESSMENT: Pt discussed during multidisciplinary rounds.   Pt with metastatic lung CA undergoing chemotherapy. Pt with 1 week of vomiting where she was unable to keep down anything. Pt reports before then she was eating 2 meals/day. States she's lost 10 pounds unintentionally in the past year. Pt c/o some taste changes from chemotherapy. Pt not on any nutritional supplements at home. Pt denies any vomiting today but c/o nausea. Pt with significantly elevated Alk phos, and elevated AST/ALT.   Nutrition Focused Physical Exam:  Subcutaneous Fat:  Orbital Region: mild/moderate wasting Upper Arm Region: severe wasting Thoracic and Lumbar Region: mild/moderate wasting  Muscle:  Temple Region: mild/moderate wasting Clavicle Bone Region: mild/moderate wasting Clavicle and Acromion Bone Region: mild/moderate wasting Scapular Bone Region: NA Dorsal Hand: mild/moderate wasting Patellar Region: severe wasting Anterior Thigh Region: severe wasting Posterior Calf Region: severe wasting  Edema: None noted    Height: Ht  Readings from Last 1 Encounters:  09/30/12 5' 7.75" (1.721 m)    Weight: Wt Readings from Last 1 Encounters:  09/30/12 115 lb 4.8 oz (52.3 kg)    Ideal Body Weight: 140 lb   % Ideal Body Weight: 82%  Wt Readings from Last 10 Encounters:  09/30/12 115 lb 4.8 oz (52.3 kg)  09/29/12 116 lb 8 oz (52.844 kg)  09/25/12 117 lb 8 oz (53.298 kg)  09/10/12 119 lb 3.2 oz (54.069 kg)  08/27/12 120 lb 8 oz (54.658 kg)  06/18/12 116 lb 12.8 oz (52.98 kg)  05/27/12 116 lb 1.6 oz (52.663 kg)  05/06/12 117 lb (53.071 kg)  04/15/12 115 lb (52.164 kg)  04/13/12 115 lb (52.164 kg)    Usual Body Weight: 125 lb last year per pt  % Usual Body Weight: 92%  BMI:  Body mass index is 17.66 kg/(m^2). Underweight  Estimated Nutritional Needs: Kcal: 1600-1850 Protein: 65-80g Fluid: 1.6-1.8L/day  Skin: Intact   Diet Order: Clear Liquid  EDUCATION NEEDS: -No education needs identified at this time   Intake/Output Summary (Last 24 hours) at 09/30/12 1200 Last data filed at 09/30/12 1107  Gross per 24 hour  Intake    350 ml  Output    650 ml  Net   -300 ml    Last BM: 9/2   Labs:   Recent Labs Lab 09/25/12 0922 09/29/12 1852 09/30/12 0355  NA 129* 127* 125*  K 4.1 3.8 3.9  CL 99 92* 92*  CO2 22  --  24  BUN 21 23 19   CREATININE 1.4* 1.50* 1.24*  CALCIUM 8.8  --  7.9*  GLUCOSE 107* 110* 113*    CBG (last 3)  No results found for  this basename: GLUCAP,  in the last 72 hours  Scheduled Meds: . amLODipine  10 mg Oral Daily  . artificial tears  1 application Both Eyes TID  . aspirin  81 mg Oral Daily  . calcium-vitamin D  2 tablet Oral Daily  . irbesartan  300 mg Oral Daily  . [START ON 10/02/2012] levofloxacin (LEVAQUIN) IV  750 mg Intravenous Q48H  . metoprolol succinate  50 mg Oral Daily  . multivitamin with minerals  1 tablet Oral Daily  . simvastatin  20 mg Oral QPM    Continuous Infusions: . dextrose 5 % and 0.9 % NaCl with KCl 20 mEq/L 125 mL/hr at 09/30/12 0131     Past Medical History  Diagnosis Date  . Hyperlipidemia   . Hypertension   . History  of basal cell carcinoma   . Light cigarette smoker   . Kidney stone 01/2000    stent. Renal U/S normal in 12/01.  Marland Kitchen Heart murmur   . Arthritis   . Osteoarthritis   . Lung mass October 2013    RUL  . COPD (chronic obstructive pulmonary disease)   . Ascending aortic aneurysm   . Cancer     skin  . Lung cancer 12/19/2011  . Hx of radiation therapy 01/01/12- 02/21/12    right upper chest region, 63 gray, 35 fx    Past Surgical History  Procedure Laterality Date  . Partial hysterectomy  1978    bleeding  . Kidney stone surgery  01/2000    stent  . Colonoscopy    . Video bronchoscopy with endobronchial ultrasound  11/27/2011    Procedure: VIDEO BRONCHOSCOPY WITH ENDOBRONCHIAL ULTRASOUND;  Surgeon: Leslye Peer, MD;  Location: [facility] OR;  Service: Pulmonary;  Laterality: N/A;    Levon Hedger MS, RD, LDN [phone] Pager [phone] After Hours Pager

## 2012-09-30 NOTE — Progress Notes (Addendum)
[facility] PROGRESS NOTE  Pamela Levine ZHY:[id number] DOB: 1945-12-30 DOA: 09/29/2012 PCP: [name], MD  Brief narrative: 66 -year-old female with past medical history of lung cancer with hepatic metastasis, undergoing chemotherapy and last chemotherapy 09/16/2012, anemia and thrombocytopenia who presented to [facility] ED for concern of dehydration, nausea and vomiting. On admission patient was found to have leukocytosis of 11.8, hemoglobin of 6.9 and platelet count of 21. BMP revealed a sodium of 127 and creatinine of 1.5. Chest x-ray showed COPD with apical scarring.  Assessment/Plan:  Principal Problem:   Nausea with vomiting, dehydration - secondary to effects of chemotherapy - Continue supportive care with IV fluids and antiemetics; Zofran 8 mg IV every 6 hours PRN - Advance diet as tolerated Active Problems:   CAP (community acquired pneumonia) - Followup blood culture results - Continue Levaquin   HYPERLIPIDEMIA - Start home statin therapy, simvastatin 20 mg daily   HYPERTENSION - Restart home medications, Norvasc, Avapro, metoprolol   COPD (chronic obstructive pulmonary disease) - Respiratory status stable   Lung cancer with liver metastasis - Last chemotherapy 09/16/2012, under Dr. Arbutus Ped care   Hyponatremia - Secondary to GI losses, dehydration - Continue IV fluids - Followup BMP in the morning   Acute renal failure - Secondary to prerenal causes, dehydration - Creatinine trending down   Anemia of chronic disease - Secondary to history of lung cancer and sequela of chemotherapy - Hemoglobin 6.9 and patient is receiving 1 unit of PRBC at this time   Leukocytosis, unspecified - Secondary to community-acquired pneumonia - Continue Levaquin   Moderate protein-calorie malnutrition - Nutrition consulted   Code Status: DNR/DNI Family Communication: no family at the bedside Disposition Plan: home when stable  [name], MD  [facility] Pager  [phone]  If 7PM-7AM, please contact night-coverage [email or web address] Password [email or web address] 09/30/2012, 11:29 AM   LOS: 1 day   Consultants:  None   Procedures:  None   Antibiotics:  Levaquin 09/29/2012 -->   HPI/Subjective: No acute overnight events.  Objective: Filed Vitals:   09/30/12 0838 09/30/12 0938 09/30/12 1038 09/30/12 1107  BP: 132/60 136/62 162/58 146/62  Pulse: 80 78 76 77  Temp: 98.2 F (36.8 C) 98.5 F (36.9 C) 98.2 F (36.8 C) 98.4 F (36.9 C)  TempSrc: Oral Oral Oral Oral  Resp: 18 15 18 18   Height:      Weight:      SpO2: 97% 97% 98% 98%    Intake/Output Summary (Last 24 hours) at 09/30/12 1129 Last data filed at 09/30/12 1107  Gross per 24 hour  Intake    350 ml  Output    650 ml  Net   -300 ml    Exam:   General:  Pt is alert, follows commands appropriately, not in acute distress  Cardiovascular: Regular rate and rhythm, S1/S2, SEM appreciated, no rubs, no gallops  Respiratory: Clear to auscultation bilaterally, no wheezing, no crackles, no rhonchi  Abdomen: Soft, non tender, non distended, bowel sounds present, no guarding  Extremities: No edema, pulses DP and PT palpable bilaterally  Neuro: Grossly nonfocal  Data Reviewed: Basic Metabolic Panel:  Recent Labs Lab 09/25/12 0922 09/29/12 1852 09/30/12 0355  NA 129* 127* 125*  K 4.1 3.8 3.9  CL 99 92* 92*  CO2 22  --  24  GLUCOSE 107* 110* 113*  BUN 21 23 19   CREATININE 1.4* 1.50* 1.24*  CALCIUM 8.8  --  7.9*   Liver Function Tests:  Recent Labs Lab 09/30/12  0355  AST 76*  ALT 71*  ALKPHOS 1348*  BILITOT 0.8  PROT 5.7*  ALBUMIN 1.8*    Recent Labs Lab 09/29/12 1846  LIPASE 12   No results found for this basename: AMMONIA,  in the last 168 hours CBC:  Recent Labs Lab 09/25/12 0922 09/29/12 1846 09/29/12 1852 09/30/12 0355  WBC 13.1* 11.8*  --  12.9*  NEUTROABS 12.3* 10.2*  --   --   HGB 8.0 Repeated and verified X2.* 8.1* 8.2* 6.9*  HCT 23.7 Repeated and  verified X2.* 23.4* 24.0* 19.4*  MCV 97.0 94.0  --  93.7  PLT 36.0 Repeated and verified X2.* 21*  --  20*   Cardiac Enzymes: No results found for this basename: CKTOTAL, CKMB, CKMBINDEX, TROPONINI,  in the last 168 hours BNP: No components found with this basename: POCBNP,  CBG: No results found for this basename: GLUCAP,  in the last 168 hours  URINE CULTURE     Status: None   Collection Time    09/25/12  3:34 PM      Result Value Range Status   Colony Count NO GROWTH   Final   Organism ID, Bacteria NO GROWTH   Final     Studies: Dg Chest 2 View 09/29/2012   * IMPRESSION: COPD with apical scarring.  No acute abnormality.   Original Report Authenticated By: Janeece Riggers, M.D.    Scheduled Meds: . amLODipine  10 mg Oral Daily  . aspirin  81 mg Oral Daily  . calcium-vitamin D  2 tablet Oral Daily  . irbesartan  300 mg Oral Daily  .  levofloxacin   750 mg Intravenous Q48H  . metoprolol succinate  50 mg Oral Daily  . multivitamin   1 tablet Oral Daily  . simvastatin  20 mg Oral Daily   Continuous Infusions: . dextrose 5 % and 0.9 % NaCl with KCl 20 mEq/L 125 mL/hr at 09/30/12 0131

## 2012-09-30 NOTE — Progress Notes (Signed)
MD ordered for pt. To receive one unit of blood. Pt. Was transfused one unit of blood and tolerated it well. Will continue to monitor.

## 2012-09-30 NOTE — Progress Notes (Signed)
CRITICAL VALUE ALERT  Critical value received: HGB  Date of notification:  09/30/2012  Time of notification: 0430  Critical value read back: hgb 6.9 Nurse who received alert: Bobbye Morton  MD notified (1st page): Schorr  Time of first page:  [phone]  MD notified (2nd page)  Time of second page:  Responding MD: schorr  Time MD responded:  0500

## 2012-10-01 DIAGNOSIS — E871 Hypo-osmolality and hyponatremia: Secondary | ICD-10-CM

## 2012-10-01 DIAGNOSIS — N179 Acute kidney failure, unspecified: Secondary | ICD-10-CM

## 2012-10-01 DIAGNOSIS — D6959 Other secondary thrombocytopenia: Principal | ICD-10-CM

## 2012-10-01 DIAGNOSIS — D696 Thrombocytopenia, unspecified: Secondary | ICD-10-CM | POA: Diagnosis present

## 2012-10-01 LAB — BASIC METABOLIC PANEL
GFR calc Af Amer: 54 mL/min — ABNORMAL LOW (ref >90)
GFR calc non Af Amer: 47 mL/min — ABNORMAL LOW (ref >90)
Potassium: 4.4 mEq/L (ref 3.5–5.1)
Sodium: 129 mEq/L — ABNORMAL LOW (ref 135–145)

## 2012-10-01 LAB — PREPARE RBC (CROSSMATCH)

## 2012-10-01 LAB — CBC
Hemoglobin: 7.7 g/dL — ABNORMAL LOW (ref 12.0–15.0)
MCHC: 34.1 g/dL (ref 30.0–36.0)
RBC: 2.44 MIL/uL — ABNORMAL LOW (ref 3.87–5.11)
WBC: 11 10*3/uL — ABNORMAL HIGH (ref 4.0–10.5)

## 2012-10-01 NOTE — Progress Notes (Signed)
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   PRIOR THERAPY:  1) Concurrent chemoradiation with weekly carboplatin for AUC of 2 and paclitaxel 45 mg/M2.  2) Consolidation systemic chemotherapy with carboplatin for AUC of 5 and paclitaxel 175 mg/M2 with Neulasta support every 3 weeks. Status post 3 cycles, last dose was given on 12/07/2011 with stable disease.   CURRENT THERAPY: systemic chemotherapy with carboplatin for AUC of 5 on day 1 and gemcitabine 1000 mg/M2 on days 1 and 8 every 3 weeks, first cycle on 09/16/2012.    HPI: Ms Rupnow was admitted to [facility] on 09/29/2012 after presenting to her PCP with complaints of fatigue, anorexia , nausea, vomiting, dehydration due to poor oral intake and  and chills.  She was found to have leukocytosis with WBC 11.8, hemoglobin of 6.9 and platelet count of 21k. CMET revealed a sodium of 127 and creatinine of 1.5. Chest x-ray showed COPD with apical scarring but no acute abnormality. She received one unit of PRBC transfusion 09/30/2012 with with good response in hemoglobin at 7.7 thus, requiring another unit today 9/4. Her WBC is 11 (from 12.9) and her platelets are 15 (from 20k on 9/3) without bleeding issues. Final cultures pending.    No new complaints. Symptoms slowly improving. She denies subjective fever or chills this morning. Appetite still poor. Improved  Nausea, no  vomiting today. Appears to be resting confortably. No confusion.   Medications:  . amLODipine  10 mg Oral Daily  . artificial tears  1 application Both Eyes TID  . aspirin  81 mg Oral Daily  . calcium-vitamin D  2 tablet Oral Daily  . feeding supplement  1 Container Oral BID BM  . irbesartan  300 mg Oral Daily  . [START ON 10/02/2012] levofloxacin (LEVAQUIN) IV  750 mg Intravenous Q48H  . metoprolol succinate  50 mg Oral  Daily  . multivitamin with minerals  1 tablet Oral Daily  . simvastatin  20 mg Oral QPM   LORazepam, morphine injection, ondansetron (ZOFRAN) IV, traMADol  Objective: Vital signs in last 24 hours: Temp:  [99 F (37.2 C)] 99 F (37.2 C) (09/03 1356) Pulse Rate:  [80] 80 (09/03 1356) Resp:  [18] 18 (09/03 1356) BP: (120)/(60) 120/60 mmHg (09/04 0929) SpO2:  [96 %] 96 % (09/03 1356)  Intake/Output from previous day: 09/03 0701 - 09/04 0700 In: 470 [P.O.:120; Blood:350] Out: 1750 [Urine:1750] Intake/Output this shift:   Physical Exam:  General appearance: alert, cooperative and no distress . Weak, ill appearing Head: Temporal wasting noted.atraumatic  Neck: no adenopathy  Lymph nodes: Cervical, supraclavicular, and axillary nodes normal.  Resp: clear to auscultation bilaterally  Cardio: regular rate and rhythm,no murmur, click, rub or gallop  GI: soft, non-tender; bowel sounds normal; no masses, no organomegaly  Extremities: extremities normal, atraumatic, no cyanosis or edema . Muscle wasting noted. Neurologic: Alert and oriented X 3, normal strength and tone. Normal symmetric reflexes. Normal coordination and gait  Lab Results:   Recent Labs  09/30/12 0355 09/30/12 1251 10/01/12 0344  WBC 12.9*  --  11.0*  HGB 6.9* 7.9* 7.7*  HCT 19.4* 23.2* 22.6*  PLT 20*  --  15*   BMET  Recent Labs  09/30/12 0355 10/01/12 0344  NA 125* 129*  K 3.9 4.4  CL 92* 98  CO2 24 23  GLUCOSE 113* 106*  BUN  19 12  CREATININE 1.24* 1.19*  CALCIUM 7.9* 7.8*    Studies/Results: Dg Chest 2 View  09/29/2012   *RADIOLOGY REPORT*  Clinical Data: Fever.  Smoker.  CHEST - 2 VIEW  Comparison: Chest CT 08/26/2012.  Chest x-ray 12/10/2011  Findings: COPD.  Apical pleural and parenchymal scarring bilaterally is stable.  Cardiac enlargement without heart failure.  Negative for pneumonia or effusion.  IMPRESSION: COPD with apical scarring.  No acute abnormality.   Original Report Authenticated  By: Janeece Riggers, M.D.      Assessment/Plan: 1.  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 Currently on  systemic chemotherapy with carboplatin for AUC of 5 on day 1 and gemcitabine 1000 mg/M2 on days 1 and 8 every 3 weeks, first cycle on 09/16/2012.   2. Nausea, vomiting and dehydration, improving with IVF and antiemetics. Improved oral intake. Appreciate admitting team and Nutrition involvement.   3. ?CAP, with cultures pending. On Levaquin.  4. Hyponatremia, as per IM. Improving from 125 to 129.   5. ARI, secondary to dehydration, improving.   6. Anemia, multifactorial, s/p transfusion total 3 unitis . New CBC pending.  7. Leukocytosis, in the setting of ?CAP, slight improvement. Repeat CBC in am.   8. Thrombocytopenia, multifactorial in the setting of chemo, dilution, polypharmacy, no acute bleeding issues. Will monitor closely with CBC  9. DNR     LOS: 2 days   Addendum: Hematology/Oncology Attending: The patient is seen and examined today. I agree with the above note. She had metastatic non-small cell lung cancer and recently started on systemic chemotherapy with carboplatin and gemcitabine status post 1 cycle. The patient was recently admitted with nausea vomiting as well as pneumonia. She is feeling a little bit better today but continues to have persistent thrombocytopenia secondary to recent chemotherapy as well as sepsis and polypharmacy. Continue current supportive care. No need for platelet transfusion unless her platelets count is less than 10,000 or the patient has any bleeding issues. Consider the patient for 2 units of PRBCs transfusion to keep her hemoglobin up of 8.0 g/dL. Thank you for taking good care of Ms. Coley. I will continue to follow up the patient with you and assist in her management an as-needed basis.

## 2012-10-01 NOTE — Progress Notes (Signed)
[facility] PROGRESS NOTE  SYRITA DOVEL WUJ:[id number] DOB: 14-Nov-1945 DOA: 09/29/2012 PCP: [name], MD  Brief narrative: 67 -year-old female with past medical history of lung cancer with hepatic metastasis, undergoing chemotherapy and last chemotherapy 09/16/2012, anemia and thrombocytopenia who presented to [facility] ED for concern of dehydration, nausea and vomiting. On admission patient was found to have leukocytosis of 11.8, hemoglobin of 6.9 and platelet count of 21. BMP revealed a sodium of 127 and creatinine of 1.5. Chest x-ray showed COPD with apical scarring. Patient has received one unit of PRBC transfusion 09/30/2012 with appropriate rise in hemoglobin but hemoglobin is still low at 7.7 so we will transfuse another unit of PRBC today.   Assessment/Plan:   Principal Problem:  Nausea with vomiting, dehydration  - secondary to effects of chemotherapy  - Patient feels better today - Continue supportive care with IV fluids and antiemetics, Zofran 8 mg IV every 6 hours as needed - diet advanced to regular Active Problems:  CAP (community acquired pneumonia)  - Blood culture results - no growth to date - Continue Levaquin  HYPERLIPIDEMIA  - Continue simvastatin 20 mg daily HYPERTENSION  - Restart home medications, Norvasc, Avapro, metoprolol  COPD (chronic obstructive pulmonary disease)  - Respiratory status stable  Lung cancer with liver metastasis  - Last chemotherapy 09/16/2012 - Management per oncology Hyponatremia  - Secondary to GI losses, dehydration  - Continue IV fluids  - Sodium improving, 125 --> 129 Thrombocytopenia - Likely sequela of chemotherapy - White blood count 15 today - [name] to monitor CBC Acute renal failure  - Secondary to prerenal causes, dehydration  - Creatinine is continuously trending down Anemia of chronic disease  - Secondary to history of lung cancer and sequela of chemotherapy  - Hemoglobin 6.9 and after 1 unit of PRBCs 7.9 and  subsequently 7.7. Transfuse one more unit of PRBC today Leukocytosis, unspecified  - Secondary to community-acquired pneumonia  - Continue Levaquin  Moderate protein-calorie malnutrition  - Nutrition consulted    Code Status: DNR/DNI  Family Communication: no family at the bedside  Disposition Plan: home when stable   [name], MD  [facility]  Pager [phone]   Consultants:  None  Procedures:  None  Antibiotics:  Levaquin 09/29/2012 -->   [name], MD  [facility] Pager [phone]  If 7PM-7AM, please contact night-coverage [email or web address] Password [email or web address] 10/01/2012, 1:11 PM   LOS: 2 days    HPI/Subjective: No acute overnight events.  Objective: Filed Vitals:   09/30/12 1356 10/01/12 0929 10/01/12 1211 10/01/12 1240  BP:  120/60 130/58 129/58  Pulse: 80   77  Temp: 99 F (37.2 C)  98.1 F (36.7 C) 98.4 F (36.9 C)  TempSrc: Oral  Oral Oral  Resp: 18  18 18   Height:      Weight:      SpO2: 96%       Intake/Output Summary (Last 24 hours) at 10/01/12 1311 Last data filed at 10/01/12 1221  Gross per 24 hour  Intake  182.5 ml  Output   1750 ml  Net -1567.5 ml    Exam:   General:  Pt is alert, follows commands appropriately, not in acute distress  Cardiovascular: Regular rate and rhythm, SEM appreciated  Respiratory: Clear to auscultation bilaterally, no wheezing, no crackles, no rhonchi  Abdomen: Soft, non tender, non distended, bowel sounds present, no guarding  Extremities: No edema, pulses DP and PT palpable bilaterally  Neuro: Grossly nonfocal  Data Reviewed: Basic  Metabolic Panel:  Recent Labs Lab 09/25/12 0922 09/29/12 1852 09/30/12 0355 10/01/12 0344  NA 129* 127* 125* 129*  K 4.1 3.8 3.9 4.4  CL 99 92* 92* 98  CO2 22  --  24 23  GLUCOSE 107* 110* 113* 106*  BUN 21 23 19 12   CREATININE 1.4* 1.50* 1.24* 1.19*  CALCIUM 8.8  --  7.9* 7.8*   Liver Function Tests:  Recent Labs Lab 09/30/12 0355  AST 76*  ALT  71*  ALKPHOS 1348*  BILITOT 0.8  PROT 5.7*  ALBUMIN 1.8*    Recent Labs Lab 09/29/12 1846  LIPASE 12   No results found for this basename: AMMONIA,  in the last 168 hours CBC:  Recent Labs Lab 09/25/12 0922 09/29/12 1846 09/29/12 1852 09/30/12 0355 09/30/12 1251 10/01/12 0344  WBC 13.1* 11.8*  --  12.9*  --  11.0*  NEUTROABS 12.3* 10.2*  --   --   --   --   HGB 8.0 Repeated and verified X2.* 8.1* 8.2* 6.9* 7.9* 7.7*  HCT 23.7 Repeated and verified X2.* 23.4* 24.0* 19.4* 23.2* 22.6*  MCV 97.0 94.0  --  93.7  --  92.6  PLT 36.0 Repeated and verified X2.* 21*  --  20*  --  15*   Cardiac Enzymes: No results found for this basename: CKTOTAL, CKMB, CKMBINDEX, TROPONINI,  in the last 168 hours BNP: No components found with this basename: POCBNP,  CBG: No results found for this basename: GLUCAP,  in the last 168 hours  URINE CULTURE     Status: None   Collection Time    09/25/12  3:34 PM      Result Value Range Status   Colony Count NO GROWTH   Final   Organism ID, Bacteria NO GROWTH   Final  CULTURE, BLOOD (ROUTINE X 2)     Status: None   Collection Time    09/29/12  9:00 PM      Result Value Range Status   Specimen Description BLOOD LEFT ANTECUBITAL   Final   Special Requests BOTTLES DRAWN AEROBIC AND ANAEROBIC 4CC   Final   Culture  Setup Time     Final   Value: 09/30/2012 02:41     Performed at [facility]   Culture     Final   Value:        BLOOD CULTURE RECEIVED NO GROWTH TO DATE CULTURE WILL BE HELD FOR 5 DAYS BEFORE ISSUING A FINAL NEGATIVE REPORT     Performed at [facility]   Report Status PENDING   Incomplete  CULTURE, BLOOD (ROUTINE X 2)     Status: None   Collection Time    09/29/12  9:29 PM      Result Value Range Status   Specimen Description BLOOD RIGHT ANTECUBITAL   Final   Special Requests BOTTLES DRAWN AEROBIC AND ANAEROBIC 3CC   Final   Culture  Setup Time     Final   Value: 09/30/2012 05:21     Performed at [facility]   Culture     Final   Value:        BLOOD CULTURE RECEIVED NO GROWTH TO DATE CULTURE WILL BE HELD FOR 5 DAYS BEFORE ISSUING A FINAL NEGATIVE REPORT     Performed at [facility]   Report Status PENDING   Incomplete     Studies: Dg Chest 2 View 09/29/2012   * IMPRESSION: COPD with apical scarring.  No acute abnormality.   Original Report Authenticated By: Janeece Riggers, M.D.    Scheduled Meds: . amLODipine  10 mg Oral Daily  . aspirin  81 mg Oral Daily  . calcium-vitamin D  2 tablet Oral Daily  . irbesartan  300 mg Oral Daily  . (LEVAQUIN)   750 mg Intravenous Q48H  . metoprolol succinate  50 mg Oral Daily  . multivitamin  1 tablet Oral Daily  . simvastatin  20 mg Oral QPM   Continuous Infusions: . dextrose 5 % and 0.9 % NaCl with KCl 20 mEq/L 125 mL/hr at 10/01/12 0358

## 2012-10-01 NOTE — Progress Notes (Signed)
CRITICAL VALUE ALERT  Critical value received:  Plat 15  Date of notification:  10/01/12  Time of notification:  0530  Critical value read back: yes  Nurse who received alert:  Arlington Calix, RN  MD notified (1st page):  Schorr  Time of first page:  0535  MD notified (2nd page):  Time of second page:  Responding MD:  Schorr  Time MD responded:  0540, no new orders

## 2012-10-02 DIAGNOSIS — C3490 Malignant neoplasm of unspecified part of unspecified bronchus or lung: Secondary | ICD-10-CM

## 2012-10-02 LAB — CBC
HCT: 25.4 % — ABNORMAL LOW (ref 36.0–46.0)
Hemoglobin: 8.8 g/dL — ABNORMAL LOW (ref 12.0–15.0)
RDW: 15.3 % (ref 11.5–15.5)
WBC: 12.5 10*3/uL — ABNORMAL HIGH (ref 4.0–10.5)

## 2012-10-02 LAB — TYPE AND SCREEN
ABO/RH(D): O POS
Antibody Screen: NEGATIVE
Unit division: 0
Unit division: 0

## 2012-10-02 LAB — BASIC METABOLIC PANEL
BUN: 10 mg/dL (ref 6–23)
Chloride: 97 mEq/L (ref 96–112)
GFR calc Af Amer: 57 mL/min — ABNORMAL LOW (ref >90)
Potassium: 4.5 mEq/L (ref 3.5–5.1)

## 2012-10-02 MED ORDER — SALINE SPRAY 0.65 % NA SOLN
1.0000 | NASAL | Status: DC | PRN
  Administered 2012-10-02: 1 via NASAL
  Filled 2012-10-02: qty 44

## 2012-10-02 MED ORDER — DM-GUAIFENESIN ER 30-600 MG PO TB12
1.0000 | ORAL_TABLET | Freq: Two times a day (BID) | ORAL | Status: DC
  Administered 2012-10-02 – 2012-10-03 (×3): 1 via ORAL
  Filled 2012-10-02 (×4): qty 1

## 2012-10-02 NOTE — Progress Notes (Signed)
[facility] PROGRESS NOTE  Pamela Levine ZOX:[id number] DOB: 10-27-1945 DOA: 09/29/2012 PCP: [name], MD  Brief narrative: 67 -year-old female with past medical history of lung cancer with hepatic metastasis, undergoing chemotherapy and last chemotherapy 09/16/2012, anemia and thrombocytopenia who presented to [facility] ED for concern of dehydration, nausea and vomiting.  On admission patient was found to have leukocytosis of 11.8, hemoglobin of 6.9 and platelet count of 21. BMP revealed a sodium of 127 and creatinine of 1.5. Chest x-ray showed COPD with apical scarring. Patient has received a total of 2 units of PRBCs since the time of the admission. Hospital course complicated due to persistent thrombocytopenia.  Assessment/Plan:   Principal Problem:  Nausea with vomiting, dehydration  - secondary to effects of chemotherapy  - Patient still has nausea but no vomiting - Patient tolerates liquid diet. She did not try regular diet even order was placed. - Continue supportive care with IV fluids (decreased the rate) and continue antiemetics, Zofran 8 mg IV every 6 hours as needed   Active Problems:  CAP (community acquired pneumonia)  - Blood culture results - no growth to date  - Continue Levaquin and stop tomorrow HYPERLIPIDEMIA  - Continue simvastatin 20 mg daily  HYPERTENSION  - Continue Norvasc, Avapro, metoprolol  COPD (chronic obstructive pulmonary disease)  - Respiratory status stable  Lung cancer with liver metastasis  - Last chemotherapy 09/16/2012  - Management per oncology  Hyponatremia  - Secondary to GI losses, dehydration  - Continue IV fluids  - Sodium improving, sodium ranges 125 --129  Thrombocytopenia  - Likely sequela of chemotherapy  - White blood count 13 today  - [name] to monitor CBC  Acute renal failure  - Secondary to prerenal causes, dehydration  - Creatinine trending down Anemia of chronic disease  - Secondary to history of lung cancer and  sequela of chemotherapy  - Hemoglobin 6.9 and after 2 units of PRBC transfusion hemoglobin is 8.8 Leukocytosis, unspecified  - Secondary to community-acquired pneumonia  - Continue Levaquin for one more day Moderate protein-calorie malnutrition  - Nutrition consulted    Code Status: DNR/DNI  Family Communication: no family at the bedside  Disposition Plan: home when stable   Consultants:  None  Procedures:  None  Antibiotics:  Levaquin 09/29/2012 -->   [name], MD  [facility]  Pager [phone]  If 7PM-7AM, please contact night-coverage [email or web address] Password [email or web address] 10/02/2012, 1:30 PM   LOS: 3 days    HPI/Subjective: No acute overnight events.  Objective: Filed Vitals:   10/01/12 1449 10/01/12 2106 10/02/12 0453 10/02/12 0924  BP: 140/60 128/64 144/68 138/64  Pulse: 78 91 95 72  Temp: 98.4 F (36.9 C) 99.3 F (37.4 C) 98.5 F (36.9 C)   TempSrc: Oral Oral Oral   Resp: 18 18 16    Height:      Weight:      SpO2:  94% 98%     Intake/Output Summary (Last 24 hours) at 10/02/12 1330 Last data filed at 10/02/12 1309  Gross per 24 hour  Intake   2660 ml  Output    800 ml  Net   1860 ml    Exam:   General:  Pt is alert, follows commands appropriately, not in acute distress; pale  Cardiovascular: Regular rate and rhythm, S1/S2, no murmurs, no rubs, no gallops  Respiratory: Clear to auscultation bilaterally, no wheezing, no crackles, no rhonchi  Abdomen: Soft, non tender, non distended, bowel sounds present, no guarding  Extremities:  No edema, pulses DP and PT palpable bilaterally  Neuro: Grossly nonfocal  Data Reviewed: Basic Metabolic Panel:  Recent Labs Lab 09/29/12 1852 09/30/12 0355 10/01/12 0344 10/02/12 0345  NA 127* 125* 129* 127*  K 3.8 3.9 4.4 4.5  CL 92* 92* 98 97  CO2  --  24 23 22   GLUCOSE 110* 113* 106* 101*  BUN 23 19 12 10   CREATININE 1.50* 1.24* 1.19* 1.14*  CALCIUM  --  7.9* 7.8* 7.8*   Liver Function  Tests:  Recent Labs Lab 09/30/12 0355  AST 76*  ALT 71*  ALKPHOS 1348*  BILITOT 0.8  PROT 5.7*  ALBUMIN 1.8*    Recent Labs Lab 09/29/12 1846  LIPASE 12   No results found for this basename: AMMONIA,  in the last 168 hours CBC:  Recent Labs Lab 09/29/12 1846 09/29/12 1852 09/30/12 0355 09/30/12 1251 10/01/12 0344 10/02/12 0345  WBC 11.8*  --  12.9*  --  11.0* 12.5*  NEUTROABS 10.2*  --   --   --   --   --   HGB 8.1* 8.2* 6.9* 7.9* 7.7* 8.8*  HCT 23.4* 24.0* 19.4* 23.2* 22.6* 25.4*  MCV 94.0  --  93.7  --  92.6 90.7  PLT 21*  --  20*  --  15* 13*   Cardiac Enzymes: No results found for this basename: CKTOTAL, CKMB, CKMBINDEX, TROPONINI,  in the last 168 hours BNP: No components found with this basename: POCBNP,  CBG: No results found for this basename: GLUCAP,  in the last 168 hours  Recent Results (from the past 240 hour(s))  URINE CULTURE     Status: None   Collection Time    09/25/12  3:34 PM      Result Value Range Status   Colony Count NO GROWTH   Final   Organism ID, Bacteria NO GROWTH   Final  CULTURE, BLOOD (ROUTINE X 2)     Status: None   Collection Time    09/29/12  9:00 PM      Result Value Range Status   Specimen Description BLOOD LEFT ANTECUBITAL   Final   Special Requests BOTTLES DRAWN AEROBIC AND ANAEROBIC 4CC   Final   Culture  Setup Time     Final   Value: 09/30/2012 02:41     Performed at [facility]   Culture     Final   Value:        BLOOD CULTURE RECEIVED NO GROWTH TO DATE CULTURE WILL BE HELD FOR 5 DAYS BEFORE ISSUING A FINAL NEGATIVE REPORT     Performed at [facility]   Report Status PENDING   Incomplete  CULTURE, BLOOD (ROUTINE X 2)     Status: None   Collection Time    09/29/12  9:29 PM      Result Value Range Status   Specimen Description BLOOD RIGHT ANTECUBITAL   Final   Special Requests BOTTLES DRAWN AEROBIC AND ANAEROBIC 3CC   Final   Culture  Setup Time     Final   Value: 09/30/2012 05:21      Performed at [facility]   Culture     Final   Value:        BLOOD CULTURE RECEIVED NO GROWTH TO DATE CULTURE WILL BE HELD FOR 5 DAYS BEFORE ISSUING A FINAL NEGATIVE REPORT     Performed at [facility]   Report Status PENDING   Incomplete     Studies:  No results found.  Scheduled Meds: . amLODipine  10 mg Oral Daily  . artificial tears  1 application Both Eyes TID  . aspirin  81 mg Oral Daily  . calcium-vitamin D  2 tablet Oral Daily  . dextromethorphan-guaiFENesin  1 tablet Oral BID  . feeding supplement  1 Container Oral BID BM  . irbesartan  300 mg Oral Daily  . levofloxacin (LEVAQUIN) IV  750 mg Intravenous Q48H  . metoprolol succinate  50 mg Oral Daily  . multivitamin with minerals  1 tablet Oral Daily  . simvastatin  20 mg Oral QPM   Continuous Infusions: . dextrose 5 % and 0.9 % NaCl with KCl 20 mEq/L 125 mL/hr at 10/02/12 [phone]

## 2012-10-03 DIAGNOSIS — D72829 Elevated white blood cell count, unspecified: Secondary | ICD-10-CM

## 2012-10-03 LAB — BASIC METABOLIC PANEL
BUN: 9 mg/dL (ref 6–23)
CO2: 21 mEq/L (ref 19–32)
Chloride: 97 mEq/L (ref 96–112)
GFR calc non Af Amer: 53 mL/min — ABNORMAL LOW (ref >90)
Glucose, Bld: 100 mg/dL — ABNORMAL HIGH (ref 70–99)
Potassium: 4.5 mEq/L (ref 3.5–5.1)

## 2012-10-03 LAB — CBC
HCT: 25.7 % — ABNORMAL LOW (ref 36.0–46.0)
Hemoglobin: 8.7 g/dL — ABNORMAL LOW (ref 12.0–15.0)
MCHC: 33.9 g/dL (ref 30.0–36.0)

## 2012-10-03 MED ORDER — ONDANSETRON HCL 8 MG PO TABS: 8.0000 mg | ORAL_TABLET | Freq: Three times a day (TID) | ORAL | Status: DC | PRN

## 2012-10-03 MED ORDER — LORAZEPAM 1 MG PO TABS: 1.0000 mg | ORAL_TABLET | Freq: Three times a day (TID) | ORAL | Status: DC | PRN

## 2012-10-03 MED ORDER — OXYCODONE-ACETAMINOPHEN 5-325 MG PO TABS: 1.0000 | ORAL_TABLET | ORAL | Status: DC | PRN

## 2012-10-03 MED ORDER — BOOST / RESOURCE BREEZE PO LIQD: 1.0000 | Freq: Two times a day (BID) | ORAL | Status: DC

## 2012-10-03 MED ORDER — DM-GUAIFENESIN ER 30-600 MG PO TB12: 1.0000 | ORAL_TABLET | Freq: Two times a day (BID) | ORAL | Status: DC | PRN

## 2012-10-03 NOTE — Progress Notes (Signed)
Pt. Was discharged home. She was given her discharge instructions and prescriptions and all questions were answered.  Pt. Was transported home by family.

## 2012-10-03 NOTE — Discharge Summary (Signed)
Physician Discharge Summary  Pamela Levine:[id number] DOB: [birth year] DOA: 09/29/2012  PCP: [name], MD  Admit date: 09/29/2012 Discharge date: 10/03/2012  Recommendations for Outpatient Follow-up:  1. Please follow up in cancer center 10/06/12 at 9-9:30 am for follow up  2. Your platelet count has been 13  for past 2 days. You did not receive platelet transfusion while in hospital  3. Your hemoglobin is 8.7 prior to discharge. You have received 2 units PRBC while in hospital.  4. Your kidney function was slightly above normal limit, 1.19 but uit has returned to normal with creatinine of 1.08  5. Please have your sodium rechecked on your next appointment in cancer center. Sodium is 126 prior to discharge (normal range is 135-145)  6. Your white blood cell count is 13.7 prior to discharge  7. Would recommend holding Benicar (Blood pressure medication) as it may have contributed to kidney dysfunction.   Discharge Diagnoses:  Principal Problem:   Nausea with vomiting Active Problems:   Dehydration   HYPERLIPIDEMIA   HYPERTENSION   COPD (chronic obstructive pulmonary disease)   Lung cancer   Hyponatremia   Anemia of chronic disease   Leukocytosis, unspecified   Moderate protein-calorie malnutrition   CAP (community acquired pneumonia)   Acute renal failure   Thrombocytopenia, unspecified   Protein-calorie malnutrition, severe    Discharge Condition: medically stable for discharge today; please note that the pt insisted ongoing home today and she will follow up in cancer center Tuesday 9/9 for blood work. We discussed extensively her platelet count and the what her current count is. She understands and is aware of potential bleed. I have advised her to call me if any problems arise from the time of discharge till being seen in cancer center.  Diet recommendation: as tolerated  History of present illness:  67 -year-old female with past medical history of lung cancer with  hepatic metastasis, undergoing chemotherapy and last chemotherapy 09/16/2012, anemia and thrombocytopenia who presented to [facility] ED for concern of dehydration, nausea and vomiting.  On admission patient was found to have leukocytosis of 11.8, hemoglobin of 6.9 and platelet count of 21. BMP revealed a sodium of 127 and creatinine of 1.5. Chest x-ray showed COPD with apical scarring. Patient has received a total of 2 units of PRBCs since the time of the admission. Hospital course complicated due to persistent thrombocytopenia.   Assessment/Plan:   Principal Problem:  Nausea with vomiting, dehydration  - secondary to effects of chemotherapy  - Patient feels weak but wants to go home as she feels she will sleep better in her own home. Tolerates some PO intake but has poor appetite  - will discontinue IV fluids prior to discharge Active Problems:  CAP (community acquired pneumonia)  - Blood culture results - no growth to date  - may stop Levaquin, has received it for total of 7 days HYPERLIPIDEMIA  - Continue simvastatin 20 mg daily  HYPERTENSION  - Continue Norvasc, metoprolol  - hold Avapro due to concern of kidney failure COPD (chronic obstructive pulmonary disease)  - Respiratory status stable  Lung cancer with liver metastasis  - Last chemotherapy 09/16/2012  - Management per oncology; appt 9/9 at 9 - 9:30 amd  Hyponatremia  - Secondary to GI losses, dehydration  - Sodium ranges 125 --129  Thrombocytopenia  - Likely sequela of chemotherapy  - White blood count 13 for past 2 days - [name] to monitor CBC  Acute renal failure  - Secondary  to prerenal causes, dehydration  - Creatinine trending down to normal level  Anemia of chronic disease  - Secondary to history of lung cancer and sequela of chemotherapy  - Hemoglobin 6.9 and after 2 units of PRBC transfusion hemoglobin is 8.7 Leukocytosis, unspecified  - Secondary to community-acquired pneumonia  Moderate protein-calorie malnutrition   - Nutrition consulted   Code Status: DNR/DNI  Family Communication: no family at the bedside  Disposition Plan: home when stable   Consultants:  None  Procedures:  None  Antibiotics:  Levaquin 09/29/2012 -->    Signed:  [name], MD  [facility] 10/03/2012, 8:51 AM  Pager #: [phone]   Discharge Exam: Filed Vitals:   10/03/12 0500  BP: 125/60  Pulse: 87  Temp: 98.6 F (37 C)  Resp: 16   Filed Vitals:   10/02/12 0924 10/02/12 1335 10/02/12 2018 10/03/12 0500  BP: 138/64 120/80 122/70 125/60  Pulse: 72 79 85 87  Temp:  98.4 F (36.9 C) 99 F (37.2 C) 98.6 F (37 C)  TempSrc:  Oral Oral Oral  Resp:  16 16 16   Height:      Weight:      SpO2:  97% 94% 92%    General: Pt is alert, follows commands appropriately, not in acute distress Cardiovascular: Regular rate and rhythm, S1/S2 +, no murmurs, no rubs, no gallops Respiratory: Clear to auscultation bilaterally, no wheezing, no crackles, no rhonchi Abdominal: Soft, non tender, non distended, bowel sounds +, no guarding Extremities: no edema, no cyanosis, pulses palpable bilaterally DP and PT Neuro: Grossly nonfocal  Discharge Instructions  Discharge Orders   Future Appointments Provider Department Dept Phone   10/07/2012 9:00 AM Beverely Pace [facility] [place] [facility] [phone]   10/07/2012 9:30 AM [facility] [place] [facility] [phone]   10/14/2012 9:00 AM Krista Blue [facility] [phone]   10/14/2012 9:30 AM [facility] [place] [facility] [phone]   10/16/2012 9:15 AM Judy Pimple, MD [facility] [phone]   10/21/2012 9:00 AM [facility] [place] [facility] [phone]   Future Orders Complete By Expires   Call MD for:  difficulty breathing, headache or visual disturbances  As directed    Call MD for:  persistant  dizziness or light-headedness  As directed    Call MD for:  persistant nausea and vomiting  As directed    Call MD for:  redness, tenderness, or signs of infection (pain, swelling, redness, odor or green/yellow discharge around incision site)  As directed    Diet - low sodium heart healthy  As directed    Discharge instructions  As directed    Comments:     1. Please follow up in cancer center 10/06/12 at 9-9:30 am for follow up 2. Your platelet count has been 13  for past 2 days. You did not receive platelet transfusion while in hospital 3. Your hemoglobin is 8.7 prior to discharge. You have received 2 units PRBC while in hospital. 4. Your kidney function was slightly above normal limit, 1.19 but uit has returned to normal with creatinine of 1.08 5. Please have your sodium rechecked on your next appointment in cancer center. Sodium is 126 prior to discharge (normal range is 135-145) 6. Your white blood cell count is 13.7 prior to discharge 7. Would recommend holding Benicar (Blood pressure medication) as it may have contributed to kidney dysfunction.  Increase activity slowly  As directed        Medication List    STOP taking these medications       aspirin 81 MG chewable tablet     levofloxacin 500 MG tablet  Commonly known as:  LEVAQUIN     olmesartan 40 MG tablet  Commonly known as:  BENICAR      TAKE these medications       amLODipine 10 MG tablet  Commonly known as:  NORVASC  Take 1 tablet (10 mg total) by mouth daily.     CALTRATE 600+D 600-400 MG-UNIT per tablet  Generic drug:  Calcium Carbonate-Vitamin D  Take 2 tablets by mouth daily.     dextromethorphan-guaiFENesin 30-600 MG per 12 hr tablet  Commonly known as:  MUCINEX DM  Take 1 tablet by mouth 2 (two) times daily as needed.     DRY EYES OP  Place 1 drop into both eyes 3 (three) times daily as needed (for dry eyes).     feeding supplement Liqd  Take 1 Container by mouth 2 (two) times daily between meals.      LORazepam 1 MG tablet  Commonly known as:  ATIVAN  Take 1 tablet (1 mg total) by mouth every 8 (eight) hours as needed for anxiety (Give with Zofran to augment antiemetic effect.).     metoprolol succinate 50 MG 24 hr tablet  Commonly known as:  TOPROL-XL  Take 1 tablet (50 mg total) by mouth daily. Take with or immediately following a meal.     multivitamin with minerals Tabs tablet  Take 1 tablet by mouth daily.     ondansetron 8 MG tablet  Commonly known as:  ZOFRAN  Take 1 tablet (8 mg total) by mouth every 8 (eight) hours as needed for nausea (or vomiting).     oxyCODONE-acetaminophen 5-325 MG per tablet  Commonly known as:  ROXICET  Take 1 tablet by mouth every 4 (four) hours as needed for pain.     PRESCRIPTION MEDICATION  Inject into the vein as directed. Gemzar and Paraplatin infusion on weeks 1 and 2 of 3 week cycle.     simvastatin 20 MG tablet  Commonly known as:  ZOCOR  Take 1 tablet (20 mg total) by mouth daily.     traMADol 50 MG tablet  Commonly known as:  ULTRAM  Take 1 tablet (50 mg total) by mouth every 8 (eight) hours as needed for pain.           Follow-up Information   Follow up with [name], MD In 2 weeks.   Specialties:  Family Medicine, Radiology   Contact information:   [address] [place] [place] [address] [phone]       Follow up with Lajuana Matte., MD On 10/06/2012. (9 - 9:30 am check CBC and BMP)    Specialty:  Oncology   Contact information:   [address] [place] [address] [phone]        The results of significant diagnostics from this hospitalization (including imaging, microbiology, ancillary and laboratory) are listed below for reference.    Significant Diagnostic Studies: Dg Chest 2 View  09/29/2012   *RADIOLOGY REPORT*  Clinical Data: Fever.  Smoker.  CHEST - 2 VIEW  Comparison: Chest CT 08/26/2012.  Chest x-ray 12/10/2011  Findings: COPD.  Apical pleural and  parenchymal scarring bilaterally is stable.  Cardiac enlargement without heart failure.  Negative for pneumonia or  effusion.  IMPRESSION: COPD with apical scarring.  No acute abnormality.   Original Report Authenticated By: Janeece Riggers, M.D.    Microbiology: Recent Results (from the past 240 hour(s))  URINE CULTURE     Status: None   Collection Time    09/25/12  3:34 PM      Result Value Range Status   Colony Count NO GROWTH   Final   Organism ID, Bacteria NO GROWTH   Final  CULTURE, BLOOD (ROUTINE X 2)     Status: None   Collection Time    09/29/12  9:00 PM      Result Value Range Status   Specimen Description BLOOD LEFT ANTECUBITAL   Final   Special Requests BOTTLES DRAWN AEROBIC AND ANAEROBIC 4CC   Final   Culture  Setup Time     Final   Value: 09/30/2012 02:41     Performed at [facility]   Culture     Final   Value:        BLOOD CULTURE RECEIVED NO GROWTH TO DATE CULTURE WILL BE HELD FOR 5 DAYS BEFORE ISSUING A FINAL NEGATIVE REPORT     Performed at [facility]   Report Status PENDING   Incomplete  CULTURE, BLOOD (ROUTINE X 2)     Status: None   Collection Time    09/29/12  9:29 PM      Result Value Range Status   Specimen Description BLOOD RIGHT ANTECUBITAL   Final   Special Requests BOTTLES DRAWN AEROBIC AND ANAEROBIC 3CC   Final   Culture  Setup Time     Final   Value: 09/30/2012 05:21     Performed at [facility]   Culture     Final   Value:        BLOOD CULTURE RECEIVED NO GROWTH TO DATE CULTURE WILL BE HELD FOR 5 DAYS BEFORE ISSUING A FINAL NEGATIVE REPORT     Performed at [facility]   Report Status PENDING   Incomplete     Labs: Basic Metabolic Panel:  Recent Labs Lab 09/29/12 1852 09/30/12 0355 10/01/12 0344 10/02/12 0345 10/03/12 0425  NA 127* 125* 129* 127* 126*  K 3.8 3.9 4.4 4.5 4.5  CL 92* 92* 98 97 97  CO2  --  24 23 22 21   GLUCOSE 110* 113* 106* 101* 100*  BUN 23 19 12 10 9   CREATININE 1.50* 1.24* 1.19*  1.14* 1.08  CALCIUM  --  7.9* 7.8* 7.8* 7.8*   Liver Function Tests:  Recent Labs Lab 09/30/12 0355  AST 76*  ALT 71*  ALKPHOS 1348*  BILITOT 0.8  PROT 5.7*  ALBUMIN 1.8*    Recent Labs Lab 09/29/12 1846  LIPASE 12   No results found for this basename: AMMONIA,  in the last 168 hours CBC:  Recent Labs Lab 09/29/12 1846  09/30/12 0355 09/30/12 1251 10/01/12 0344 10/02/12 0345 10/03/12 0425  WBC 11.8*  --  12.9*  --  11.0* 12.5* 13.7*  NEUTROABS 10.2*  --   --   --   --   --   --   HGB 8.1*  < > 6.9* 7.9* 7.7* 8.8* 8.7*  HCT 23.4*  < > 19.4* 23.2* 22.6* 25.4* 25.7*  MCV 94.0  --  93.7  --  92.6 90.7 91.5  PLT 21*  --  20*  --  15* 13* 13*  < > = values in this interval not displayed. Cardiac Enzymes: No  results found for this basename: CKTOTAL, CKMB, CKMBINDEX, TROPONINI,  in the last 168 hours BNP: BNP (last 3 results) No results found for this basename: PROBNP,  in the last 8760 hours CBG: No results found for this basename: GLUCAP,  in the last 168 hours  Time coordinating discharge: Over 30 minutes

## 2012-10-06 ENCOUNTER — Ambulatory Visit (INDEPENDENT_AMBULATORY_CARE_PROVIDER_SITE_OTHER): Payer: BC Managed Care – PPO | Admitting: Family Medicine

## 2012-10-06 ENCOUNTER — Encounter: Payer: Self-pay | Admitting: Internal Medicine

## 2012-10-06 ENCOUNTER — Other Ambulatory Visit (HOSPITAL_BASED_OUTPATIENT_CLINIC_OR_DEPARTMENT_OTHER): Payer: BC Managed Care – PPO | Admitting: Lab

## 2012-10-06 ENCOUNTER — Telehealth: Payer: Self-pay | Admitting: Internal Medicine

## 2012-10-06 ENCOUNTER — Encounter: Payer: Self-pay | Admitting: Family Medicine

## 2012-10-06 ENCOUNTER — Other Ambulatory Visit: Payer: Self-pay | Admitting: Medical Oncology

## 2012-10-06 VITALS — BP 128/58 | HR 91 | Temp 97.5°F | Ht 67.75 in | Wt 121.0 lb

## 2012-10-06 DIAGNOSIS — I10 Essential (primary) hypertension: Secondary | ICD-10-CM

## 2012-10-06 DIAGNOSIS — C3410 Malignant neoplasm of upper lobe, unspecified bronchus or lung: Secondary | ICD-10-CM

## 2012-10-06 DIAGNOSIS — N179 Acute kidney failure, unspecified: Secondary | ICD-10-CM

## 2012-10-06 DIAGNOSIS — E871 Hypo-osmolality and hyponatremia: Secondary | ICD-10-CM

## 2012-10-06 DIAGNOSIS — R112 Nausea with vomiting, unspecified: Secondary | ICD-10-CM

## 2012-10-06 DIAGNOSIS — D696 Thrombocytopenia, unspecified: Secondary | ICD-10-CM

## 2012-10-06 DIAGNOSIS — C3490 Malignant neoplasm of unspecified part of unspecified bronchus or lung: Secondary | ICD-10-CM

## 2012-10-06 DIAGNOSIS — D638 Anemia in other chronic diseases classified elsewhere: Secondary | ICD-10-CM

## 2012-10-06 DIAGNOSIS — D72829 Elevated white blood cell count, unspecified: Secondary | ICD-10-CM

## 2012-10-06 DIAGNOSIS — R17 Unspecified jaundice: Secondary | ICD-10-CM

## 2012-10-06 LAB — CBC WITH DIFFERENTIAL/PLATELET
Basophils Absolute: 0 10*3/uL (ref 0.0–0.1)
Eosinophils Absolute: 0.1 10*3/uL (ref 0.0–0.5)
HCT: 26.9 % — ABNORMAL LOW (ref 34.8–46.6)
LYMPH%: 3.2 % — ABNORMAL LOW (ref 14.0–49.7)
MCV: 89.7 fL (ref 79.5–101.0)
MONO#: 0.7 10*3/uL (ref 0.1–0.9)
MONO%: 3.8 % (ref 0.0–14.0)
NEUT#: 17.5 10*3/uL — ABNORMAL HIGH (ref 1.5–6.5)
NEUT%: 92.6 % — ABNORMAL HIGH (ref 38.4–76.8)
Platelets: 32 10*3/uL — ABNORMAL LOW (ref 145–400)
WBC: 18.9 10*3/uL — ABNORMAL HIGH (ref 3.9–10.3)

## 2012-10-06 LAB — COMPREHENSIVE METABOLIC PANEL (CC13)
ALT: 35 U/L (ref 0–55)
BUN: 17.8 mg/dL (ref 7.0–26.0)
CO2: 22 mEq/L (ref 22–29)
Calcium: 8 mg/dL — ABNORMAL LOW (ref 8.4–10.4)
Chloride: 96 mEq/L — ABNORMAL LOW (ref 98–109)
Creatinine: 1.3 mg/dL — ABNORMAL HIGH (ref 0.6–1.1)
Glucose: 98 mg/dl (ref 70–140)
Total Bilirubin: 4.4 mg/dL — ABNORMAL HIGH (ref 0.20–1.20)

## 2012-10-06 LAB — CULTURE, BLOOD (ROUTINE X 2): Culture: NO GROWTH

## 2012-10-06 NOTE — Assessment & Plan Note (Signed)
S/p clinical resolution of pneumonia - was still up in hospital  Up further this am  Lab Results  Component Value Date   WBC 18.9* 10/06/2012    She has no c/o of fever or infectious symptoms at this time

## 2012-10-06 NOTE — Telephone Encounter (Signed)
Gave pt appt for lab, Md and chemo for September 2014 °

## 2012-10-06 NOTE — Assessment & Plan Note (Addendum)
Now off benicar and hctz due to elevated cr (in setting of high cr/ dehydration)  Will continue to watch bp but stable now  She complains about fluid retention but no pedal edema on exam

## 2012-10-06 NOTE — Assessment & Plan Note (Signed)
This has slowed significantly and is thought 2ndary to her chemo (though ? If liver could also play a role) Has zofran 8 mg that works sometimes - enc her to use it as needed and work hard on nutrition  Rev hosp notes and studies with her in detail today

## 2012-10-06 NOTE — Telephone Encounter (Signed)
gv pt appt schedule for September and October. appts scheduled per new pof sent 9/9.

## 2012-10-06 NOTE — Assessment & Plan Note (Signed)
Improved in hospital with hydration and now her benicar and hctz is held  Will continue to work on hydration  Lab Results  Component Value Date   CREATININE 1.3* 10/06/2012   will continue to follow  F/u oncol pending

## 2012-10-06 NOTE — Progress Notes (Signed)
Subjective:    Patient ID: Pamela Levine, female    DOB: [birth year], 67 y.o.   MRN: [id number]  HPI Here for f/u after hospitalization   She had a transfusion of 2 units  Did not get platelets  Hb went up to 8.7 Lab Results  Component Value Date   WBC 18.9* 10/06/2012   HGB 9.3* 10/06/2012   HCT 26.9* 10/06/2012   MCV 89.7 10/06/2012   PLT 32* 10/06/2012    Wbc is higher and hb is up to 9.3 and platelets are 32     Chemistry      Component Value Date/Time   NA 127* 10/06/2012 0921   NA 126* 10/03/2012 0425   K 4.0 10/06/2012 0921   K 4.5 10/03/2012 0425   CL 97 10/03/2012 0425   CL 103 05/27/2012 0854   CO2 22 10/06/2012 0921   CO2 21 10/03/2012 0425   BUN 17.8 10/06/2012 0921   BUN 9 10/03/2012 0425   CREATININE 1.3* 10/06/2012 0921   CREATININE 1.08 10/03/2012 0425      Component Value Date/Time   CALCIUM 8.0* 10/06/2012 0921   CALCIUM 7.8* 10/03/2012 0425   ALKPHOS 1,290* 10/06/2012 0921   ALKPHOS 1348* 09/30/2012 0355   AST 49* 10/06/2012 0921   AST 76* 09/30/2012 0355   ALT 35 10/06/2012 0921   ALT 71* 09/30/2012 0355   BILITOT 4.40* 10/06/2012 0921   BILITOT 0.8 09/30/2012 0355       She is getting enough fluids now  They took her off the benicar - she is a bit swollen in feet  She wears support stockings   Is a bit jaudiced today  Bili is high  Waiting for Dr Shirline Frees comment   Is able to eat now better - not eating a lot - able to keep food down now  Is getting fluids in fairly well   Patient Active Problem List   Diagnosis Date Noted  . Thrombocytopenia, unspecified 10/01/2012  . Leukocytosis, unspecified 09/30/2012  . Moderate protein-calorie malnutrition 09/30/2012  . CAP (community acquired pneumonia) 09/30/2012  . Acute renal failure 09/30/2012  . Protein-calorie malnutrition, severe 09/30/2012  . Nausea with vomiting 09/29/2012  . Dehydration 09/29/2012  . Anemia of chronic disease 09/25/2012  . Hyponatremia 04/13/2012  . Lung cancer 12/19/2011  . COPD (chronic obstructive  pulmonary disease) 11/05/2011  . Ascending aortic aneurysm 10/25/2011  . HYPERLIPIDEMIA 10/17/2006  . HYPERTENSION 10/17/2006  . CARCINOMA, BASAL CELL, FACE 10/17/2006   Past Medical History  Diagnosis Date  . Hyperlipidemia   . Hypertension   . History  of basal cell carcinoma   . Light cigarette smoker   . Kidney stone 01/2000    stent. Renal U/S normal in 12/01.  Marland Kitchen Heart murmur   . Arthritis   . Osteoarthritis   . Lung mass October 2013    RUL  . COPD (chronic obstructive pulmonary disease)   . Ascending aortic aneurysm   . Cancer     skin  . Lung cancer 12/19/2011  . Hx of radiation therapy 01/01/12- 02/21/12    right upper chest region, 63 gray, 35 fx   Past Surgical History  Procedure Laterality Date  . Partial hysterectomy  1978    bleeding  . Kidney stone surgery  01/2000    stent  . Colonoscopy    . Video bronchoscopy with endobronchial ultrasound  11/27/2011    Procedure: VIDEO BRONCHOSCOPY WITH ENDOBRONCHIAL ULTRASOUND;  Surgeon: Leslye Peer, MD;  Location: [facility] OR;  Service: Pulmonary;  Laterality: N/A;   History  Substance Use Topics  . Smoking status: Former Smoker -- 25 years    Types: Cigarettes    Quit date: 01/29/2007  . Smokeless tobacco: Not on file  . Alcohol Use: Yes     Comment: occasional   Family History  Problem Relation Age of Onset  . Lung cancer Mother     lung- non smoker  . Hypertension Father   . Stroke Father     cerebral hemorrhage  . Lung cancer Sister     lung  . Diabetes Son    Allergies  Allergen Reactions  . Ace Inhibitors     Cough    Current Outpatient Prescriptions on File Prior to Visit  Medication Sig Dispense Refill  . amLODipine (NORVASC) 10 MG tablet Take 1 tablet (10 mg total) by mouth daily.  90 tablet  3  . Artificial Tear Ointment (DRY EYES OP) Place 1 drop into both eyes 3 (three) times daily as needed (for dry eyes).       . Calcium Carbonate-Vitamin D (CALTRATE 600+D) 600-400 MG-UNIT per tablet Take  2 tablets by mouth daily.       Marland Kitchen dextromethorphan-guaiFENesin (MUCINEX DM) 30-600 MG per 12 hr tablet Take 1 tablet by mouth 2 (two) times daily as needed.  45 tablet  0  . feeding supplement (RESOURCE BREEZE) LIQD Take 1 Container by mouth 2 (two) times daily between meals.  10 Container  0  . LORazepam (ATIVAN) 1 MG tablet Take 1 tablet (1 mg total) by mouth every 8 (eight) hours as needed for anxiety (Give with Zofran to augment antiemetic effect.).  45 tablet  0  . metoprolol succinate (TOPROL-XL) 50 MG 24 hr tablet Take 1 tablet (50 mg total) by mouth daily. Take with or immediately following a meal.  90 tablet  3  . Multiple Vitamin (MULTIVITAMIN WITH MINERALS) TABS tablet Take 1 tablet by mouth daily.      . ondansetron (ZOFRAN) 8 MG tablet Take 1 tablet (8 mg total) by mouth every 8 (eight) hours as needed for nausea (or vomiting).  60 tablet  0  . oxyCODONE-acetaminophen (ROXICET) 5-325 MG per tablet Take 1 tablet by mouth every 4 (four) hours as needed for pain.  45 tablet  0  . PRESCRIPTION MEDICATION Inject into the vein as directed. Gemzar and Paraplatin infusion on weeks 1 and 2 of 3 week cycle.      . simvastatin (ZOCOR) 20 MG tablet Take 1 tablet (20 mg total) by mouth daily.  90 tablet  3  . traMADol (ULTRAM) 50 MG tablet Take 1 tablet (50 mg total) by mouth every 8 (eight) hours as needed for pain.  30 tablet  1   No current facility-administered medications on file prior to visit.    Review of Systems Review of Systems  Constitutional: Negative for fever, appetite change,  and unexpected weight change.pos for fatigue, pos for weight gain of 6 lb (which is good)  Eyes: Negative for pain and visual disturbance.  Respiratory: Negative for cough and shortness of breath.  pos for occas wheeze  Cardiovascular: Negative for cp or palpitations    Gastrointestinal: Negative for nausea, diarrhea and constipation. pos for occ RUQ abd soreness Genitourinary: Negative for urgency and  frequency. pos for dark urine (poss due to jaundice) Skin: Negative for pallor or rash  pos for sallow complexion Neurological: Negative for weakness, light-headedness, numbness and headaches.  Hematological: Negative for adenopathy. Does not bruise/bleed easily.  Psychiatric/Behavioral: Negative for dysphoric mood. The patient is not nervous/anxious.         Objective:   Physical Exam  Constitutional: She appears well-developed. No distress.  Very thin and chronically ill appearing with mild jaundice    HENT:  Head: Normocephalic and atraumatic.  Mouth/Throat: Oropharynx is clear and moist.  Eyes: EOM are normal. Pupils are equal, round, and reactive to light. Scleral icterus is present.  Mild icterus  Neck: Normal range of motion. Neck supple. No JVD present. No tracheal deviation present. No thyromegaly present.  Cardiovascular: Normal rate, regular rhythm, normal heart sounds and intact distal pulses.  Exam reveals no gallop.   Rate in high 80s   Pulmonary/Chest: Effort normal and breath sounds normal. No respiratory distress. She has no wheezes. She has no rales.  Abdominal: Soft. Bowel sounds are normal. She exhibits no distension and no mass. There is no hepatosplenomegaly. There is tenderness in the right upper quadrant. There is no rigidity, no rebound, no guarding, no CVA tenderness and negative Murphy's sign.  Tenderness is very mild  Musculoskeletal: She exhibits no edema and no tenderness.  Lymphadenopathy:    She has no cervical adenopathy.  Neurological: She is alert. She has normal reflexes. She displays no tremor. No cranial nerve deficit. She exhibits normal muscle tone. Coordination normal.  Skin: Skin is warm and dry. No rash noted. No erythema. There is pallor.  Sallow to mildly jaundiced skin color   Psychiatric: She has a normal mood and affect.  Cheerful and talkative- she feels better today          Assessment & Plan:

## 2012-10-06 NOTE — Assessment & Plan Note (Signed)
This improved since hosp She did not have a platelet transfusion  Thought to be 2ndary to chemo  No bruising or bleeding per pt  Improved today on labs this am in oncology Lab Results  Component Value Date   PLT 32* 10/06/2012

## 2012-10-06 NOTE — Assessment & Plan Note (Signed)
Thought to be due to chemo and chronic dz S/p 2 U tranfusion in hospital  Lab Results  Component Value Date   WBC 18.9* 10/06/2012   HGB 9.3* 10/06/2012   HCT 26.9* 10/06/2012   MCV 89.7 10/06/2012   PLT 32* 10/06/2012    For heme/ onc f/u soon

## 2012-10-06 NOTE — Assessment & Plan Note (Signed)
Thought to be 2ndary to chemo and vomiting  Some imp in hospital   Chemistry      Component Value Date/Time   NA 127* 10/06/2012 0921   NA 126* 10/03/2012 0425   K 4.0 10/06/2012 0921   K 4.5 10/03/2012 0425   CL 97 10/03/2012 0425   CL 103 05/27/2012 0854   CO2 22 10/06/2012 0921   CO2 21 10/03/2012 0425   BUN 17.8 10/06/2012 0921   BUN 9 10/03/2012 0425   CREATININE 1.3* 10/06/2012 0921   CREATININE 1.08 10/03/2012 0425      Component Value Date/Time   CALCIUM 8.0* 10/06/2012 0921   CALCIUM 7.8* 10/03/2012 0425   ALKPHOS 1,290* 10/06/2012 0921   ALKPHOS 1348* 09/30/2012 0355   AST 49* 10/06/2012 0921   AST 76* 09/30/2012 0355   ALT 35 10/06/2012 0921   ALT 71* 09/30/2012 0355   BILITOT 4.40* 10/06/2012 0921   BILITOT 0.8 09/30/2012 0355     asymptomatic  Urged to work on nutrition and f/u with oncology

## 2012-10-06 NOTE — Progress Notes (Signed)
Put fmla form on nurse's desk °

## 2012-10-06 NOTE — Patient Instructions (Addendum)
I am glad you are feeling better  Your oncologist will review your labs  Follow up with oncology as planned Try your best to keep fluid intake up and eat regular meals - take your nausea medicine as needed If your symptoms worsen please let me know

## 2012-10-06 NOTE — Assessment & Plan Note (Signed)
With known liver lesions/ mets from lung cancer  Lab Results  Component Value Date   ALT 35 10/06/2012   AST 49* 10/06/2012   ALKPHOS 1,290* 10/06/2012   BILITOT 4.40* 10/06/2012   She has icterus/ but is overall feeling better  Some RUQ discomfort For oncol f/u to discuss further

## 2012-10-07 ENCOUNTER — Other Ambulatory Visit: Payer: BC Managed Care – PPO | Admitting: Lab

## 2012-10-07 ENCOUNTER — Ambulatory Visit: Payer: BC Managed Care – PPO

## 2012-10-09 ENCOUNTER — Encounter: Payer: Self-pay | Admitting: Internal Medicine

## 2012-10-09 NOTE — Progress Notes (Signed)
Faxed fmla form to [employer] @ [phone] and mailed original to patient.

## 2012-10-14 ENCOUNTER — Emergency Department (HOSPITAL_COMMUNITY): Payer: BC Managed Care – PPO

## 2012-10-14 ENCOUNTER — Inpatient Hospital Stay (HOSPITAL_COMMUNITY)
Admission: EM | Admit: 2012-10-14 | Discharge: 2012-10-16 | DRG: 813 | Disposition: A | Discharge status: Home / Self Care | Payer: BC Managed Care – PPO | Attending: Internal Medicine | Admitting: Internal Medicine

## 2012-10-14 ENCOUNTER — Ambulatory Visit: Payer: BC Managed Care – PPO

## 2012-10-14 ENCOUNTER — Ambulatory Visit: Payer: BC Managed Care – PPO | Admitting: Family Medicine

## 2012-10-14 ENCOUNTER — Telehealth: Payer: Self-pay | Admitting: *Deleted

## 2012-10-14 ENCOUNTER — Ambulatory Visit (HOSPITAL_BASED_OUTPATIENT_CLINIC_OR_DEPARTMENT_OTHER): Payer: BC Managed Care – PPO | Admitting: Physician Assistant

## 2012-10-14 ENCOUNTER — Ambulatory Visit (HOSPITAL_BASED_OUTPATIENT_CLINIC_OR_DEPARTMENT_OTHER): Payer: BC Managed Care – PPO

## 2012-10-14 ENCOUNTER — Encounter (HOSPITAL_COMMUNITY): Payer: Self-pay | Admitting: *Deleted

## 2012-10-14 ENCOUNTER — Other Ambulatory Visit (HOSPITAL_BASED_OUTPATIENT_CLINIC_OR_DEPARTMENT_OTHER): Payer: BC Managed Care – PPO | Admitting: Lab

## 2012-10-14 DIAGNOSIS — E440 Moderate protein-calorie malnutrition: Secondary | ICD-10-CM | POA: Diagnosis present

## 2012-10-14 DIAGNOSIS — Z87891 Personal history of nicotine dependence: Secondary | ICD-10-CM

## 2012-10-14 DIAGNOSIS — R197 Diarrhea, unspecified: Secondary | ICD-10-CM | POA: Diagnosis present

## 2012-10-14 DIAGNOSIS — T451X5A Adverse effect of antineoplastic and immunosuppressive drugs, initial encounter: Secondary | ICD-10-CM | POA: Diagnosis present

## 2012-10-14 DIAGNOSIS — R5381 Other malaise: Secondary | ICD-10-CM

## 2012-10-14 DIAGNOSIS — R112 Nausea with vomiting, unspecified: Principal | ICD-10-CM | POA: Diagnosis present

## 2012-10-14 DIAGNOSIS — R7989 Other specified abnormal findings of blood chemistry: Secondary | ICD-10-CM | POA: Diagnosis present

## 2012-10-14 DIAGNOSIS — Z681 Body mass index (BMI) 19 or less, adult: Secondary | ICD-10-CM

## 2012-10-14 DIAGNOSIS — D638 Anemia in other chronic diseases classified elsewhere: Secondary | ICD-10-CM | POA: Diagnosis present

## 2012-10-14 DIAGNOSIS — C787 Secondary malignant neoplasm of liver and intrahepatic bile duct: Secondary | ICD-10-CM

## 2012-10-14 DIAGNOSIS — C3410 Malignant neoplasm of upper lobe, unspecified bronchus or lung: Secondary | ICD-10-CM

## 2012-10-14 DIAGNOSIS — R748 Abnormal levels of other serum enzymes: Secondary | ICD-10-CM | POA: Diagnosis present

## 2012-10-14 DIAGNOSIS — J449 Chronic obstructive pulmonary disease, unspecified: Secondary | ICD-10-CM | POA: Diagnosis present

## 2012-10-14 DIAGNOSIS — E860 Dehydration: Secondary | ICD-10-CM | POA: Diagnosis present

## 2012-10-14 DIAGNOSIS — C3490 Malignant neoplasm of unspecified part of unspecified bronchus or lung: Secondary | ICD-10-CM

## 2012-10-14 DIAGNOSIS — R945 Abnormal results of liver function studies: Secondary | ICD-10-CM

## 2012-10-14 DIAGNOSIS — D630 Anemia in neoplastic disease: Secondary | ICD-10-CM | POA: Diagnosis present

## 2012-10-14 DIAGNOSIS — J4489 Other specified chronic obstructive pulmonary disease: Secondary | ICD-10-CM | POA: Diagnosis present

## 2012-10-14 DIAGNOSIS — D72829 Elevated white blood cell count, unspecified: Secondary | ICD-10-CM | POA: Diagnosis present

## 2012-10-14 DIAGNOSIS — I10 Essential (primary) hypertension: Secondary | ICD-10-CM | POA: Diagnosis present

## 2012-10-14 DIAGNOSIS — C799 Secondary malignant neoplasm of unspecified site: Secondary | ICD-10-CM

## 2012-10-14 DIAGNOSIS — E871 Hypo-osmolality and hyponatremia: Secondary | ICD-10-CM | POA: Diagnosis present

## 2012-10-14 DIAGNOSIS — R17 Unspecified jaundice: Secondary | ICD-10-CM | POA: Diagnosis present

## 2012-10-14 DIAGNOSIS — E785 Hyperlipidemia, unspecified: Secondary | ICD-10-CM | POA: Diagnosis present

## 2012-10-14 DIAGNOSIS — N179 Acute kidney failure, unspecified: Secondary | ICD-10-CM

## 2012-10-14 DIAGNOSIS — D6481 Anemia due to antineoplastic chemotherapy: Secondary | ICD-10-CM | POA: Diagnosis present

## 2012-10-14 DIAGNOSIS — E236 Other disorders of pituitary gland: Secondary | ICD-10-CM | POA: Diagnosis present

## 2012-10-14 DIAGNOSIS — C801 Malignant (primary) neoplasm, unspecified: Secondary | ICD-10-CM

## 2012-10-14 LAB — COMPREHENSIVE METABOLIC PANEL
ALT: 22 U/L (ref 0–35)
AST: 53 U/L — ABNORMAL HIGH (ref 0–37)
Albumin: 1.5 g/dL — ABNORMAL LOW (ref 3.5–5.2)
Calcium: 8 mg/dL — ABNORMAL LOW (ref 8.4–10.5)
GFR calc Af Amer: 59 mL/min — ABNORMAL LOW (ref >90)
Sodium: 122 mEq/L — ABNORMAL LOW (ref 135–145)
Total Protein: 5.4 g/dL — ABNORMAL LOW (ref 6.0–8.3)

## 2012-10-14 LAB — CBC WITH DIFFERENTIAL/PLATELET
BASO%: 0.2 % (ref 0.0–2.0)
Basophils Absolute: 0 10*3/uL (ref 0.0–0.1)
Eosinophils Absolute: 0 10*3/uL (ref 0.0–0.5)
Eosinophils Absolute: 0 10*3/uL (ref 0.0–0.7)
HCT: 25 % — ABNORMAL LOW (ref 34.8–46.6)
HGB: 8.8 g/dL — ABNORMAL LOW (ref 11.6–15.9)
MCH: 30.9 pg (ref 26.0–34.0)
MCHC: 35.2 g/dL (ref 31.5–36.0)
MCHC: 34.6 g/dL (ref 30.0–36.0)
MONO#: 1.3 10*3/uL — ABNORMAL HIGH (ref 0.1–0.9)
Monocytes Absolute: 0.9 10*3/uL (ref 0.1–1.0)
NEUT#: 49.3 10*3/uL — ABNORMAL HIGH (ref 1.5–6.5)
NEUT%: 94.6 % — ABNORMAL HIGH (ref 38.4–76.8)
Neutrophils Relative %: 95 % — ABNORMAL HIGH (ref 43–77)
Platelets: 180 10*3/uL (ref 150–400)
WBC: 52.1 10*3/uL (ref 3.9–10.3)
lymph#: 1.4 10*3/uL (ref 0.9–3.3)
nRBC: 0 % (ref 0–0)

## 2012-10-14 LAB — URINE MICROSCOPIC-ADD ON

## 2012-10-14 LAB — COMPREHENSIVE METABOLIC PANEL (CC13)
ALT: 23 U/L (ref 0–55)
AST: 54 U/L — ABNORMAL HIGH (ref 5–34)
Albumin: 1.6 g/dL — ABNORMAL LOW (ref 3.5–5.0)
BUN: 16.4 mg/dL (ref 7.0–26.0)
CO2: 28 mEq/L (ref 22–29)
Calcium: 8.1 mg/dL — ABNORMAL LOW (ref 8.4–10.4)
Chloride: 88 mEq/L — ABNORMAL LOW (ref 98–109)
Potassium: 3.7 mEq/L (ref 3.5–5.1)

## 2012-10-14 LAB — LIPASE, BLOOD: Lipase: 8 U/L — ABNORMAL LOW (ref 11–59)

## 2012-10-14 LAB — URINALYSIS, ROUTINE W REFLEX MICROSCOPIC
Ketones, ur: NEGATIVE mg/dL
Leukocytes, UA: NEGATIVE
Nitrite: NEGATIVE
Specific Gravity, Urine: 1.016 (ref 1.005–1.030)
pH: 6 (ref 5.0–8.0)

## 2012-10-14 MED ORDER — CALCIUM CARBONATE-VITAMIN D 500-200 MG-UNIT PO TABS
2.0000 | ORAL_TABLET | Freq: Every day | ORAL | Status: DC
  Administered 2012-10-15 – 2012-10-16 (×2): 2 via ORAL
  Filled 2012-10-14 (×2): qty 2

## 2012-10-14 MED ORDER — AMLODIPINE BESYLATE 10 MG PO TABS
10.0000 mg | ORAL_TABLET | Freq: Every day | ORAL | Status: DC
  Administered 2012-10-15 – 2012-10-16 (×2): 10 mg via ORAL
  Filled 2012-10-14 (×2): qty 1

## 2012-10-14 MED ORDER — SODIUM CHLORIDE 0.9 % IV SOLN
Freq: Once | INTRAVENOUS | Status: AC
  Administered 2012-10-14: 14:00:00 via INTRAVENOUS

## 2012-10-14 MED ORDER — ACETAMINOPHEN 325 MG PO TABS: 650.0000 mg | ORAL_TABLET | Freq: Four times a day (QID) | ORAL | Status: DC | PRN

## 2012-10-14 MED ORDER — POLYVINYL ALCOHOL 1.4 % OP SOLN
1.0000 [drp] | Freq: Two times a day (BID) | OPHTHALMIC | Status: DC | PRN
  Filled 2012-10-14: qty 15

## 2012-10-14 MED ORDER — ENOXAPARIN SODIUM 40 MG/0.4ML ~~LOC~~ SOLN
40.0000 mg | SUBCUTANEOUS | Status: DC
  Administered 2012-10-14: 40 mg via SUBCUTANEOUS
  Filled 2012-10-14 (×2): qty 0.4

## 2012-10-14 MED ORDER — ALBUTEROL SULFATE (5 MG/ML) 0.5% IN NEBU: 2.5000 mg | INHALATION_SOLUTION | RESPIRATORY_TRACT | Status: DC | PRN

## 2012-10-14 MED ORDER — ACETAMINOPHEN 650 MG RE SUPP: 650.0000 mg | Freq: Four times a day (QID) | RECTAL | Status: DC | PRN

## 2012-10-14 MED ORDER — SIMVASTATIN 20 MG PO TABS
20.0000 mg | ORAL_TABLET | Freq: Every day | ORAL | Status: DC
  Administered 2012-10-15 – 2012-10-16 (×2): 20 mg via ORAL
  Filled 2012-10-14 (×2): qty 1

## 2012-10-14 MED ORDER — ONDANSETRON HCL 4 MG/2ML IJ SOLN: 4.0000 mg | Freq: Four times a day (QID) | INTRAMUSCULAR | Status: DC | PRN

## 2012-10-14 MED ORDER — SODIUM CHLORIDE 0.9 % IV SOLN
INTRAVENOUS | Status: AC
  Administered 2012-10-14 – 2012-10-15 (×2): via INTRAVENOUS

## 2012-10-14 MED ORDER — TRAMADOL HCL 50 MG PO TABS
50.0000 mg | ORAL_TABLET | Freq: Three times a day (TID) | ORAL | Status: DC | PRN
  Administered 2012-10-15: 50 mg via ORAL
  Filled 2012-10-14: qty 1

## 2012-10-14 MED ORDER — ADULT MULTIVITAMIN W/MINERALS CH
1.0000 | ORAL_TABLET | Freq: Every day | ORAL | Status: DC
  Administered 2012-10-15 – 2012-10-16 (×2): 1 via ORAL
  Filled 2012-10-14 (×2): qty 1

## 2012-10-14 MED ORDER — ONDANSETRON HCL 4 MG/2ML IJ SOLN
4.0000 mg | Freq: Once | INTRAMUSCULAR | Status: AC
  Administered 2012-10-14: 4 mg via INTRAVENOUS
  Filled 2012-10-14: qty 2

## 2012-10-14 MED ORDER — METOPROLOL SUCCINATE ER 50 MG PO TB24
50.0000 mg | ORAL_TABLET | Freq: Every day | ORAL | Status: DC
  Administered 2012-10-15 – 2012-10-16 (×2): 50 mg via ORAL
  Filled 2012-10-14 (×2): qty 1

## 2012-10-14 MED ORDER — LORAZEPAM 1 MG PO TABS: 1.0000 mg | ORAL_TABLET | Freq: Three times a day (TID) | ORAL | Status: DC | PRN

## 2012-10-14 MED ORDER — ONDANSETRON HCL 4 MG PO TABS: 4.0000 mg | ORAL_TABLET | Freq: Four times a day (QID) | ORAL | Status: DC | PRN

## 2012-10-14 MED ORDER — ONDANSETRON HCL 4 MG/2ML IJ SOLN: 4.0000 mg | Freq: Three times a day (TID) | INTRAMUSCULAR | Status: DC | PRN

## 2012-10-14 MED ORDER — SODIUM CHLORIDE 0.9 % IV SOLN: INTRAVENOUS | Status: DC

## 2012-10-14 NOTE — Progress Notes (Addendum)
[facility] [phone]   [phone]  SHARED VISIT PROGRESS NOTE  [name], MD [address] 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   PRIOR THERAPY:  1) Concurrent chemoradiation with weekly carboplatin for AUC of 2 and paclitaxel 45 mg/M2.  2) Consolidation systemic chemotherapy with carboplatin for AUC of 5 and paclitaxel 175 mg/M2 with Neulasta support every 3 weeks. Status post 3 cycles, last dose was given on 12/07/2011 with stable disease.   CURRENT THERAPY: systemic chemotherapy with carboplatin for AUC of 5 on day 1 and gemcitabine 1000 mg/M2 on days 1 and 8 every 3 weeks, first cycle on 09/16/2012.    CHEMOTHERAPY INTENT: Palliative  CURRENT # OF CHEMOTHERAPY CYCLES: 1 CURRENT ANTIEMETICS: Compazine  CURRENT SMOKING STATUS: Quit smoking few years ago  ORAL CHEMOTHERAPY AND CONSENT: None  CURRENT BISPHOSPHONATES USE: None  PAIN MANAGEMENT: 0/10  NARCOTICS INDUCED CONSTIPATION: N/A  LIVING WILL AND CODE STATUS: Full code.   INTERVAL HISTORY: ELENA DAVIA 67 y.o. female returns to the clinic today for follow up visit accompanied by her sister-in-law. She was considered for immunotherapy clinical trial with Nivolumab but the patient does not have enough measurable disease to be eligible for the trial. She's currently being treated with systemic chemotherapy in the form of carboplatin for an AUC of 5 given on day given on day 1 and gemcitabine at 1000 mg per meter square given on days 1 and 8 every 3 weeks, status post day 1 of cycle 1. She was recently from September 2 through September 6 with low blood counts related to her first cycle of chemotherapy. She also had pneumonia, renal insufficiency as well as a low blood pressure. Her Benicar was  held due to the possible factor was having on her renal dysfunction. She presents today to proceed with cycle #2 of her systemic chemotherapy with carboplatin and gemcitabine. She states that she has not been eating very well. She denied any fever, chills, abdominal pain, diarrhea or constipation. She reports continued nausea.  She has no chest pain, shortness breath, cough or hemoptysis. She has no night sweats. She has had some mild weight loss related to her poor by mouth intake.   MEDICAL HISTORY: Past Medical History  Diagnosis Date  . Hyperlipidemia   . Hypertension   . History  of basal cell carcinoma   . Light cigarette smoker   . Kidney stone 01/2000    stent. Renal U/S normal in 12/01.  Marland Kitchen Heart murmur   . Arthritis   . Osteoarthritis   . Lung mass October 2013    RUL  . COPD (chronic obstructive pulmonary disease)   . Ascending aortic aneurysm   . Cancer     skin  . Lung cancer 12/19/2011  . Hx of radiation therapy 01/01/12- 02/21/12    right upper chest region, 63 gray, 35 fx    ALLERGIES:  is allergic to ace inhibitors.  MEDICATIONS:  No current facility-administered medications for this visit.   Current Outpatient Prescriptions  Medication Sig Dispense Refill  . amLODipine (NORVASC) 10 MG tablet Take 1 tablet (10 mg total) by mouth daily.  90 tablet  3  . Artificial Tear Ointment (DRY EYES OP) Place 1 drop into both eyes 3 (three) times daily as  needed (for dry eyes).       . Calcium Carbonate-Vitamin D (CALTRATE 600+D) 600-400 MG-UNIT per tablet Take 2 tablets by mouth daily.       Marland Kitchen dextromethorphan-guaiFENesin (MUCINEX DM) 30-600 MG per 12 hr tablet Take 1 tablet by mouth 2 (two) times daily as needed.  45 tablet  0  . feeding supplement (RESOURCE BREEZE) LIQD Take 1 Container by mouth 2 (two) times daily between meals.  10 Container  0  . LORazepam (ATIVAN) 1 MG tablet Take 1 tablet (1 mg total) by mouth every 8 (eight) hours as needed for anxiety (Give with Zofran  to augment antiemetic effect.).  45 tablet  0  . metoprolol succinate (TOPROL-XL) 50 MG 24 hr tablet Take 1 tablet (50 mg total) by mouth daily. Take with or immediately following a meal.  90 tablet  3  . Multiple Vitamin (MULTIVITAMIN WITH MINERALS) TABS tablet Take 1 tablet by mouth daily.      . ondansetron (ZOFRAN) 8 MG tablet Take 1 tablet (8 mg total) by mouth every 8 (eight) hours as needed for nausea (or vomiting).  60 tablet  0  . oxyCODONE-acetaminophen (ROXICET) 5-325 MG per tablet Take 1 tablet by mouth every 4 (four) hours as needed for pain.  45 tablet  0  . PRESCRIPTION MEDICATION Inject into the vein as directed. Gemzar and Paraplatin infusion on weeks 1 and 2 of 3 week cycle.      . simvastatin (ZOCOR) 20 MG tablet Take 1 tablet (20 mg total) by mouth daily.  90 tablet  3  . traMADol (ULTRAM) 50 MG tablet Take 1 tablet (50 mg total) by mouth every 8 (eight) hours as needed for pain.  30 tablet  1   Facility-Administered Medications Ordered in Other Visits  Medication Dose Route Frequency Provider Last Rate Last Dose  . ondansetron (ZOFRAN) injection 4 mg  4 mg Intravenous Once Glynn Octave, MD        SURGICAL HISTORY:  Past Surgical History  Procedure Laterality Date  . Partial hysterectomy  1978    bleeding  . Kidney stone surgery  01/2000    stent  . Colonoscopy    . Video bronchoscopy with endobronchial ultrasound  11/27/2011    Procedure: VIDEO BRONCHOSCOPY WITH ENDOBRONCHIAL ULTRASOUND;  Surgeon: Leslye Peer, MD;  Location: [facility] OR;  Service: Pulmonary;  Laterality: N/A;    REVIEW OF SYSTEMS:  Pertinent items are noted in HPI.   PHYSICAL EXAMINATION: General appearance: alert, cooperative, icteric and no distress Head: Normocephalic, without obvious abnormality, atraumatic Neck: no adenopathy Lymph nodes: Cervical, supraclavicular, and axillary nodes normal. Resp: clear to auscultation bilaterally Cardio: regular rate and rhythm, S1, S2 normal, no murmur,  click, rub or gallop GI: soft, non-tender; bowel sounds normal; no masses,  no organomegaly Extremities: edema 1-2+ soft tissue edema about the feet and ankles bilaterally Neurologic: Alert and oriented X 3, normal strength and tone. Normal symmetric reflexes. Normal coordination and gait  ECOG PERFORMANCE STATUS: 1 - Symptomatic but completely ambulatory  Blood pressure 138/71, pulse 97, temperature 98.4 F (36.9 C), temperature source Oral, resp. rate 20, height 5' 7.75" (1.721 m), weight 118 lb 9.6 oz (53.797 kg), last menstrual period 01/29/1976.  LABORATORY DATA: Lab Results  Component Value Date   WBC 52.1* 10/14/2012   HGB 8.8* 10/14/2012   HCT 25.0* 10/14/2012   MCV 88.0 10/14/2012   PLT 173 10/14/2012      Chemistry      Component  Value Date/Time   NA 125* 10/14/2012 1034   NA 126* 10/03/2012 0425   K 3.7 10/14/2012 1034   K 4.5 10/03/2012 0425   CL 97 10/03/2012 0425   CL 103 05/27/2012 0854   CO2 28 10/14/2012 1034   CO2 21 10/03/2012 0425   BUN 16.4 10/14/2012 1034   BUN 9 10/03/2012 0425   CREATININE 1.1 10/14/2012 1034   CREATININE 1.08 10/03/2012 0425      Component Value Date/Time   CALCIUM 8.1* 10/14/2012 1034   CALCIUM 7.8* 10/03/2012 0425   ALKPHOS 1,354* 10/14/2012 1034   ALKPHOS 1348* 09/30/2012 0355   AST 54* 10/14/2012 1034   AST 76* 09/30/2012 0355   ALT 23 10/14/2012 1034   ALT 71* 09/30/2012 0355   BILITOT 4.04* 10/14/2012 1034   BILITOT 0.8 09/30/2012 0355       RADIOGRAPHIC STUDIES: Ct Chest W Contrast  08/26/2012   *RADIOLOGY REPORT*  Clinical Data: Restaging lung cancer  CT CHEST WITH CONTRAST  Technique:  Multidetector CT imaging of the chest was performed following the standard protocol during bolus administration of intravenous contrast.  Contrast: 80mL OMNIPAQUE IOHEXOL 300 MG/ML  SOLN  Comparison: 05/22/2012  Findings: There is a small right pleural effusion identified. Stable appearance of the right apical lung density.  Similar appearance of nodular scarring within  the right middle lobe is identified, image 25/series 5.  This likely reflects a combination of pleural parenchymal scarring and known lung lesion. There is a 5 mm nodule in the left lower lobe, image 48/series 5.  This is unchanged from previous exam.  Left upper lobe nodule is unchanged measuring 3 mm, image 20/series 5.  There is a new nodule in the right middle lobe measuring 7 mm, image 43/series 5.  The heart size appears normal.  No pericardial effusion.  Prominent coronary artery calcifications involve the LAD, left circumflex and RCA coronary arteries. No mediastinal or hilar adenopathy identified.  There is no axillary or supraclavicular adenopathy.  Interval development of multi focal low attenuation lesions throughout the liver parenchyma identified.  Index lesion in the anterior right hepatic lobe measures 0.8 cm, image 56/series 2. Posterior right hepatic lobe lesion measures 6 mm, image 62/series 2.  Within the left hepatic lobe there is a 7 mm low attenuation lesion, image 53/series 2.  The adrenal glands both appear normal.  Review of the visualized bony structures is significant for multilevel spondylosis.  IMPRESSION:  1.  Interval development of multifocal low attenuation lesions within the liver parenchyma.  Suspicious for metastatic disease. 2.  No significant change in the appearance of the lungs.  There is a new nodule in the right middle lobe which measures 7 mm. 3.  Coronary artery calcifications.   Original Report Authenticated By: Signa Kell, M.D.    ASSESSMENT AND PLAN: this is a very pleasant 67 years old white female now with metastatic non-small cell lung cancer, squamous cell carcinoma with evidence for disease progression in the liver. I have a lengthy discussion with the patient about her current disease status and treatment options. The patient is ineligible for the Nivolumab clinical trial because the liver lesion were small and did not meet criteria for measurable disease.  patient is currently being treated with systemic chemotherapy in the form of carboplatin for AUC of 5 on day 1 and gemcitabine 1000 mg/M2 on days 1 and 8 every 3 weeks, status post day 1 of cycle 1. Patient was discussed with him also seen by  Dr. Arbutus Ped. She is icteric today and her white count was extremely elevated today at over 52,000. We will await the results of her stat chemistries prior to initiating her second cycle of chemotherapy as chemotherapy may need to be held pending further evaluation of her elevated white count and any abnormalities in her chemistries.  Her stat chemistries came back with a sodium of 125 chloride of 88 total bilirubin 4.04 alkaline phosphatase of 1354 and AST of 54 total protein of 5.6 albumin of 1.6 and a calcium of 8.1. Chemotherapy was held. Concerned she may have an acute obstruction involving the liver. Patient was transferred to the emergency room for further evaluation and management.  The patient voices understanding of current disease status and treatment options and is in agreement with the current care plan.  Conni Slipper, PA-C   ADDENDUM: Hematology/Oncology Attending: I have a face to face encounter with the patient during the visit. I recommended her care plan. She is a very pleasant 67 years old white female with metastatic non-small cell lung cancer with significant liver metastases are present today to start the second cycle of her systemic chemotherapy with carboplatin and Alimta but the patient looks icteric and has significant weakness and fatigue. Blood work during her visit showed significant elevation of the alkaline phosphatase as well as total bilirubin.  I recommended for the patient to go immediately to [facility] for admission for further evaluation of the elevated liver enzymes and to rule out any biliary obstruction. I will arrange for her followup visit with me after discharge from the hospital for evaluation of her  disease. The patient agreed to the current plan. Lajuana Matte., MD 10/18/2012

## 2012-10-14 NOTE — ED Notes (Signed)
Bed: ZO10 Expected date:  Expected time:  Means of arrival:  Comments: Cancer pt-elevated WBC

## 2012-10-14 NOTE — ED Notes (Signed)
Report given to [place], rn

## 2012-10-14 NOTE — Progress Notes (Addendum)
Pt discharged via wheelchair with Garlon Hatchet and taken to ED. IV remains dry and intact and fluids per gravity.

## 2012-10-14 NOTE — Telephone Encounter (Signed)
Lm gv appts for 10/21/12, 10/28/12, and 11/04/12. i emailed MW to add the tx. i made the pt aware that i will mail a letter/avs...td

## 2012-10-14 NOTE — ED Notes (Signed)
md at bedside  Pt alert and oriented x4. Respirations even and unlabored, bilateral symmetrical rise and fall of chest. Skin warm and dry. In no acute distress. Denies needs.   

## 2012-10-14 NOTE — ED Notes (Signed)
Pt sent from cancer center. Hx of luekocytosis and elevated liver enzymes. Pt has had 1 cycle of chemo 3 weeks ago, chemo held today. Not being treated radiation. Pt came to cancer center for chemo today, pt pale, blood work drawn, WBC 52, total bilirubin 4.04, and alkaline phos 1,354. Pt denies abdominal pain. Pt alert and oriented x4.   24 g left wrist.

## 2012-10-14 NOTE — ED Notes (Signed)
Per md pt allowed to have food

## 2012-10-14 NOTE — Patient Instructions (Signed)
Your being taken to the emergency room for further evaluation and management of your abnormal liver enzymes and high white count

## 2012-10-14 NOTE — H&P (Signed)
[facility] History and Physical  Pamela Levine:[id number] DOB: October 14, 1945 DOA: 10/14/2012  Referring physician: Dr. Glynn Octave PCP: [name], MD  Outpatient Specialists:  1. Medical Oncology: Dr. [name]  Chief Complaint: nausea & vomiting.  HPI: Pamela Levine is a 67 y.o. female with history of metastatic non-small cell lung cancer, squamous cell carcinoma, with evidence of liver metastasis on systemic chemotherapy-carboplatin and gemcitabine, hypertension, hyperlipidemia, COPD, former smoker, was sent from the cancer center to the ED for evaluation of abnormal labs-sodium 125, chloride 88, total bilirubin 4.04, alkaline phosphatase 1354, AST 54 and WBC 52,000. She was recently hospitalized between 09/29/12-10/03/12 for chemotherapy related nausea, vomiting, dehydration and community acquired pneumonia. She apparently had a prior admission at [facility] too. She gives 3 weeks history of persistent nausea, vomiting times one every morning consisting of clear liquids without blood or coffee grounds, extremely poor appetite, significant weight loss of approximately 11 pounds since November 2013 and darkish discoloration of urine. She denies fever, chills, abdominal pain, cough, dyspnea, dysuria or urinary frequency. She claims to have chronic loose stools up to 4-5 times per day which are soft/watery and apparently unchanged. No sickly contacts or insect bites. In the ED, repeat labs are consistent with labs done at the [facility] & abdominal ultrasound consistent with extensive metastatic liver disease. Hospitalist admission has been requested. Patient does not recall getting Neupogen.   Review of Systems: All systems reviewed and apart from history of presenting illness, are negative.   Past Medical History  Diagnosis Date  . Hyperlipidemia   . Hypertension   . History  of basal cell carcinoma   . Light cigarette smoker   . Kidney stone 01/2000     stent. Renal U/S normal in 12/01.  Marland Kitchen Heart murmur   . Arthritis   . Osteoarthritis   . Lung mass October 2013    RUL  . COPD (chronic obstructive pulmonary disease)   . Ascending aortic aneurysm   . Cancer     skin  . Lung cancer 12/19/2011  . Hx of radiation therapy 01/01/12- 02/21/12    right upper chest region, 63 gray, 35 fx   Past Surgical History  Procedure Laterality Date  . Partial hysterectomy  1978    bleeding  . Kidney stone surgery  01/2000    stent  . Colonoscopy    . Video bronchoscopy with endobronchial ultrasound  11/27/2011    Procedure: VIDEO BRONCHOSCOPY WITH ENDOBRONCHIAL ULTRASOUND;  Surgeon: Leslye Peer, MD;  Location: [facility] OR;  Service: Pulmonary;  Laterality: N/A;   Social History:  reports that she quit smoking about 5 years ago. Her smoking use included Cigarettes. She smoked 0.00 packs per day for 25 years. She does not have any smokeless tobacco history on file. She reports that  drinks alcohol. She reports that she does not use illicit drugs. Married. Lives with spouse. Independent of activities of daily living.  Allergies  Allergen Reactions  . Ace Inhibitors     Cough     Family History  Problem Relation Age of Onset  . Lung cancer Mother     lung- non smoker  . Hypertension Father   . Stroke Father     cerebral hemorrhage  . Lung cancer Sister     lung  . Diabetes Son     Prior to Admission medications   Medication Sig Start Date End Date Taking? Authorizing Provider  amLODipine (NORVASC) 10 MG tablet Take 10  mg by mouth daily.   Yes Historical Provider, MD  Calcium Carbonate-Vitamin D (CALTRATE 600+D) 600-400 MG-UNIT per tablet Take 2 tablets by mouth daily.    Yes Historical Provider, MD  dextromethorphan-guaiFENesin (MUCINEX DM) 30-600 MG per 12 hr tablet Take 1 tablet by mouth 2 (two) times daily as needed. Congestion   Yes Historical Provider, MD  feeding supplement (RESOURCE BREEZE) LIQD Take 1 Container by mouth 2 (two) times  daily as needed. Pt does not like to take   Yes Historical Provider, MD  LORazepam (ATIVAN) 1 MG tablet Take 1 mg by mouth every 8 (eight) hours as needed for anxiety (Give with Zofran to augment antiemetic effect.).   Yes Historical Provider, MD  methylcellulose (ARTIFICIAL TEARS) 1 % ophthalmic solution Place 1 drop into both eyes 2 (two) times daily as needed (dry eyes).   Yes Historical Provider, MD  metoprolol succinate (TOPROL-XL) 50 MG 24 hr tablet Take 50 mg by mouth daily. Take with or immediately following a meal.   Yes Historical Provider, MD  Multiple Vitamin (MULTIVITAMIN WITH MINERALS) TABS tablet Take 1 tablet by mouth daily.   Yes Historical Provider, MD  ondansetron (ZOFRAN) 8 MG tablet Take 8 mg by mouth every 8 (eight) hours as needed for nausea.   Yes Historical Provider, MD  PRESCRIPTION MEDICATION Inject into the vein as directed. Gemzar and Paraplatin infusion on weeks 1 and 2 of 3 week cycle.   Yes Historical Provider, MD  simvastatin (ZOCOR) 20 MG tablet Take 20 mg by mouth daily.   Yes Historical Provider, MD  traMADol (ULTRAM) 50 MG tablet Take 50 mg by mouth every 8 (eight) hours as needed for pain.   Yes Historical Provider, MD   Physical Exam: Filed Vitals:   10/14/12 1516 10/14/12 1520  BP: 139/55   Pulse:  73  Temp:  98 F (36.7 C)  TempSrc:  Oral  Resp:  16  SpO2:  98%     General exam: Moderately built and thinly nourished female patient, frail appearing but pleasant, lying comfortably supine on the gurney in no obvious distress. Does not look septic or toxic.  Head, eyes and ENT: Nontraumatic and normocephalic. Pupils equally reacting to light and accommodation. Oral mucosa dry and mildly coated dark. Tinge of scleral icterus.  Neck: Supple. No JVD, carotid bruit or thyromegaly.  Lymphatics: No lymphadenopathy.  Respiratory system: Clear to auscultation. No increased work of breathing.  Cardiovascular system: S1 and S2 heard, RRR. No JVD, murmurs,  gallops, clicks or pedal edema.  Gastrointestinal system: Abdomen is nondistended, soft and mild RUQ tenderness without rigidity, guarding or rebound. Negative Murphy sign.. Normal bowel sounds heard. Liver palpable 3 fingerbreadths below right costal margin, hard, irregular, nonpulsatile and mildly tender. No other organomegaly or masses appreciated.  Central nervous system: Alert and oriented. No focal neurological deficits.  Extremities: Symmetric 5 x 5 power. Peripheral pulses symmetrically felt.  Skin: No rashes or acute findings.  Musculoskeletal system: Negative exam.  Psychiatry: Pleasant and cooperative.   Labs on Admission:  Basic Metabolic Panel:  Recent Labs Lab 10/14/12 1034 10/14/12 1530  NA 125* 122*  K 3.7 3.8  CL  --  85*  CO2 28 26  GLUCOSE 133 96  BUN 16.4 17  CREATININE 1.1 1.11*  CALCIUM 8.1* 8.0*   Liver Function Tests:  Recent Labs Lab 10/14/12 1034 10/14/12 1530  AST 54* 53*  ALT 23 22  ALKPHOS 1,354* 1272*  BILITOT 4.04* 4.0*  PROT 5.6* 5.4*  ALBUMIN 1.6* 1.5*    Recent Labs Lab 10/14/12 1530  LIPASE 8*   No results found for this basename: AMMONIA,  in the last 168 hours CBC:  Recent Labs Lab 10/14/12 1034 10/14/12 1530  WBC 52.1* 46.8*  NEUTROABS 49.3* 44.5*  HGB 8.8* 8.2*  HCT 25.0* 23.7*  MCV 88.0 89.4  PLT 173 180   Cardiac Enzymes: No results found for this basename: CKTOTAL, CKMB, CKMBINDEX, TROPONINI,  in the last 168 hours  BNP (last 3 results) No results found for this basename: PROBNP,  in the last 8760 hours CBG: No results found for this basename: GLUCAP,  in the last 168 hours  Radiological Exams on Admission: Dg Chest 2 View  10/14/2012   CLINICAL DATA:  Hypertension. Leukocytosis.  Initial encounter.  EXAM: CHEST  2 VIEW  COMPARISON:  Two-view chest x-ray 09/29/2012.  FINDINGS: The heart size is normal. Emphysematous changes are again noted. Chronic interstitial coarsening is evident. Biapical scarring  is stable.  IMPRESSION: 1. No acute cardiopulmonary disease or significant interval change. 2. Stable chronic interstitial coarsening and biapical scarring, right greater than left.   Electronically Signed   By: Gennette Pac   On: 10/14/2012 16:15   US Abdomen Complete  10/14/2012   CLINICAL DATA:  Elevated liver function tests. History of lung carcinoma.  EXAM: ABDOMEN ULTRASOUND  COMPARISON:  None.  FINDINGS: Gallbladder  Gallbladder is mostly contracted. Is otherwise unremarkable.  Common bile duct  Diameter: No bile duct dilation. Common bile duct measures 6.5 mm.  Liver  There are numerous hypoechoic masses throughout the liver leading to a diffusely heterogeneous echogenicity. The surface is nodular. The liver appears borderline to mildly enlarged. Hepatopetal flow is documented in the portal vein.  IVC  No abnormality visualized.  Pancreas  Not well visualized.  Spleen  Normal in size and echogenicity measuring 8.2 cm.  Right Kidney  Length: 14.6 cm. The right kidney is of increased echogenicity diffusely. No mass or hydronephrosis.  Left Kidney  Length: 11.3 cm. Increased echogenicity diffusely. No mass or hydronephrosis.  Abdominal aorta  Normal in caliber proximally. Not well seen distally.  IMPRESSION: Numerous small hypoechoic liver lesions are seen leading to a diffusely heterogeneous appearance of the liver. Liver is borderline to mildly enlarged. The findings are consistent with extensive liver metastatic disease.  No acute findings. Gallbladder is contracted but otherwise unremarkable. No bile duct dilation.  Bilateral echogenic kidneys are consistent with medical renal disease.   Electronically Signed   By: Amie Portland   On: 10/14/2012 16:49    Assessment/Plan Principal Problem:   Leukocytosis, unspecified Active Problems:   HYPERLIPIDEMIA   HYPERTENSION   COPD (chronic obstructive pulmonary disease)   Lung cancer   Hyponatremia   Anemia of chronic disease   Nausea with  vomiting   Dehydration   Moderate protein-calorie malnutrition   Marked Leukocytosis/rule out C. Difficile. - No clear clinical focus of sepsis. - Followup urine microscopy. - Patient gives history of chronic unchanged loose stools. Given her history of recent hospitalization and exposure to antibiotics, will check C. difficile PCR. Hold off on antibiotics unless returns positive. - Contact isolation - Check peripheral smear and follow daily CBC.  Dehydration/nausea & vomiting - Secondary to poor oral intake, nausea and vomiting - Brief IV fluids and follow clinically. - By mouth diet as tolerated.  Hyponatremia - Patient seems to have chronic hyponatremia with sodium ranging in the 125-130 range. 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 -  Sodium slightly worse, possibly from poor oral intake, nausea and vomiting. - Check urine and sodium creatinine, urine and serum osmolarity. - Treat with brief IV normal saline and follow BMP in a.m. - Asymptomatic.  Jaundice/metastatic liver disease from non-small cell lung cancer - Abdominal ultrasound does not show any other acute findings. - Management per oncology  Anemia - Of chronic disease/malignancy/chemotherapy - Trend daily CBCs and transfuse if hemoglobin less than 7 g per DL.  Hypertension - Controlled. Continue amlodipine and metoprolol  Hyperlipidemia - Continue Zocor.  COPD - Stable.      Code Status: Full  Family Communication: Discussed with mother-in-law at bedside.  Disposition Plan: Home when medically stable.   Time spent: 65 minutes.  [facility] Pager [phone]  If 7PM-7AM, please contact night-coverage [email or web address] Password [facility] 10/14/2012, 6:22 PM

## 2012-10-14 NOTE — ED Provider Notes (Signed)
CSN: [id number]     Arrival date & time 10/14/12  1511 History   First MD Initiated Contact with Patient 10/14/12 1525     Chief Complaint  Patient presents with  . cancer pt, WBC 52, alk phos 1,354    (Consider location/radiation/quality/duration/timing/severity/associated sxs/prior Treatment) HPI Comments: Patient arrives from cancer center with leukocytosis elevated liver enzymes. She has a history of lung cancer which has metastasized to her liver. She is due for chemotherapy today her last one was 3 weeks ago. She missed a feeling weak and lightheaded and somewhat nauseated. She feels pale and jaundice. She denies any fevers denies abdominal pain, nausea vomiting. No chest pain, shortness of breath or cough. She endorses dark urine for the past month it is not painful. No abdominal pain. Discussed with Dr. Arbutus Ped who is like her to have imaging of her right upper quadrant and workup of her leukocytosis.  The history is provided by the patient.    Past Medical History  Diagnosis Date  . Hyperlipidemia   . Hypertension   . History  of basal cell carcinoma   . Light cigarette smoker   . Kidney stone 01/2000    stent. Renal U/S normal in 12/01.  Marland Kitchen Heart murmur   . Arthritis   . Osteoarthritis   . Lung mass October 2013    RUL  . COPD (chronic obstructive pulmonary disease)   . Ascending aortic aneurysm   . Cancer     skin  . Lung cancer 12/19/2011  . Hx of radiation therapy 01/01/12- 02/21/12    right upper chest region, 63 gray, 35 fx   Past Surgical History  Procedure Laterality Date  . Partial hysterectomy  1978    bleeding  . Kidney stone surgery  01/2000    stent  . Colonoscopy    . Video bronchoscopy with endobronchial ultrasound  11/27/2011    Procedure: VIDEO BRONCHOSCOPY WITH ENDOBRONCHIAL ULTRASOUND;  Surgeon: Leslye Peer, MD;  Location: [facility] OR;  Service: Pulmonary;  Laterality: N/A;   Family History  Problem Relation Age of Onset  . Lung cancer Mother      lung- non smoker  . Hypertension Father   . Stroke Father     cerebral hemorrhage  . Lung cancer Sister     lung  . Diabetes Son    History  Substance Use Topics  . Smoking status: Former Smoker -- 25 years    Types: Cigarettes    Quit date: 01/29/2007  . Smokeless tobacco: Not on file  . Alcohol Use: Yes     Comment: occasional   OB History   Grav Para Term Preterm Abortions TAB SAB Ect Mult Living                 Review of Systems  Constitutional: Positive for activity change, appetite change and fatigue. Negative for fever.  HENT: Negative for congestion and rhinorrhea.   Respiratory: Negative for cough, chest tightness and shortness of breath.   Cardiovascular: Negative for chest pain.  Gastrointestinal: Positive for nausea. Negative for vomiting and abdominal pain.  Genitourinary: Negative for dysuria, hematuria, vaginal bleeding and vaginal discharge.  Musculoskeletal: Positive for myalgias and arthralgias. Negative for back pain.  Skin: Negative for wound.  Neurological: Positive for weakness. Negative for dizziness.  A complete 10 system review of systems was obtained and all systems are negative except as noted in the HPI and PMH.    Allergies  Ace inhibitors  Home Medications  No current outpatient prescriptions on file. BP 141/47  Pulse 79  Temp(Src) 98 F (36.7 C) (Oral)  Resp 14  Ht 5' 7.5" (1.715 m)  Wt 119 lb 12.8 oz (54.341 kg)  BMI 18.48 kg/m2  SpO2 96%  LMP 01/29/1976 Physical Exam  Constitutional: She is oriented to person, place, and time. She appears well-developed and well-nourished. No distress.  HENT:  Head: Normocephalic and atraumatic.  Mouth/Throat: Oropharynx is clear and moist. No oropharyngeal exudate.  Jaundice, scleral icterus  Eyes: Conjunctivae are normal. Pupils are equal, round, and reactive to light.  Neck: Normal range of motion. Neck supple.  Cardiovascular: Normal rate and regular rhythm.   No murmur  heard. Pulmonary/Chest: Effort normal and breath sounds normal. No respiratory distress.  Abdominal: Soft. There is no tenderness. There is no rebound and no guarding.  Musculoskeletal: Normal range of motion. She exhibits no edema and no tenderness.  Neurological: She is alert and oriented to person, place, and time. No cranial nerve deficit. She exhibits normal muscle tone. Coordination normal.  CN 2-12 intact, no ataxia on finger to nose, no nystagmus, 5/5 strength throughout, no pronator drift, Romberg negative, normal gait. No asterixis  Skin: Skin is warm.    ED Course  Procedures (including critical care time) Labs Review Labs Reviewed  CBC WITH DIFFERENTIAL - Abnormal; Notable for the following:    WBC 46.8 (*)    RBC 2.65 (*)    Hemoglobin 8.2 (*)    HCT 23.7 (*)    RDW 16.1 (*)    Neutrophils Relative % 95 (*)    Lymphocytes Relative 3 (*)    Monocytes Relative 2 (*)    Neutro Abs 44.5 (*)    All other components within normal limits  COMPREHENSIVE METABOLIC PANEL - Abnormal; Notable for the following:    Sodium 122 (*)    Chloride 85 (*)    Creatinine, Ser 1.11 (*)    Calcium 8.0 (*)    Total Protein 5.4 (*)    Albumin 1.5 (*)    AST 53 (*)    Alkaline Phosphatase 1272 (*)    Total Bilirubin 4.0 (*)    GFR calc non Af Amer 51 (*)    GFR calc Af Amer 59 (*)    All other components within normal limits  LIPASE, BLOOD - Abnormal; Notable for the following:    Lipase 8 (*)    All other components within normal limits  URINALYSIS, ROUTINE W REFLEX MICROSCOPIC - Abnormal; Notable for the following:    Color, Urine AMBER (*)    Hgb urine dipstick TRACE (*)    Bilirubin Urine MODERATE (*)    Protein, ur 100 (*)    All other components within normal limits  URINE MICROSCOPIC-ADD ON - Abnormal; Notable for the following:    Casts HYALINE CASTS (*)    All other components within normal limits  CG4 I-STAT (LACTIC ACID) - Abnormal; Notable for the following:    Lactic  Acid, Venous 2.50 (*)    All other components within normal limits  CULTURE, BLOOD (ROUTINE X 2)  CULTURE, BLOOD (ROUTINE X 2)  URINE CULTURE  PROTIME-INR  PATHOLOGIST SMEAR REVIEW  COMPREHENSIVE METABOLIC PANEL  CBC  SODIUM, URINE, RANDOM  CREATININE, URINE, RANDOM   Imaging Review Dg Chest 2 View  10/14/2012   CLINICAL DATA:  Hypertension. Leukocytosis.  Initial encounter.  EXAM: CHEST  2 VIEW  COMPARISON:  Two-view chest x-ray 09/29/2012.  FINDINGS: The heart size is  normal. Emphysematous changes are again noted. Chronic interstitial coarsening is evident. Biapical scarring is stable.  IMPRESSION: 1. No acute cardiopulmonary disease or significant interval change. 2. Stable chronic interstitial coarsening and biapical scarring, right greater than left.   Electronically Signed   By: Gennette Pac   On: 10/14/2012 16:15   US Abdomen Complete  10/14/2012   CLINICAL DATA:  Elevated liver function tests. History of lung carcinoma.  EXAM: ABDOMEN ULTRASOUND  COMPARISON:  None.  FINDINGS: Gallbladder  Gallbladder is mostly contracted. Is otherwise unremarkable.  Common bile duct  Diameter: No bile duct dilation. Common bile duct measures 6.5 mm.  Liver  There are numerous hypoechoic masses throughout the liver leading to a diffusely heterogeneous echogenicity. The surface is nodular. The liver appears borderline to mildly enlarged. Hepatopetal flow is documented in the portal vein.  IVC  No abnormality visualized.  Pancreas  Not well visualized.  Spleen  Normal in size and echogenicity measuring 8.2 cm.  Right Kidney  Length: 14.6 cm. The right kidney is of increased echogenicity diffusely. No mass or hydronephrosis.  Left Kidney  Length: 11.3 cm. Increased echogenicity diffusely. No mass or hydronephrosis.  Abdominal aorta  Normal in caliber proximally. Not well seen distally.  IMPRESSION: Numerous small hypoechoic liver lesions are seen leading to a diffusely heterogeneous appearance of the liver.  Liver is borderline to mildly enlarged. The findings are consistent with extensive liver metastatic disease.  No acute findings. Gallbladder is contracted but otherwise unremarkable. No bile duct dilation.  Bilateral echogenic kidneys are consistent with medical renal disease.   Electronically Signed   By: Amie Portland   On: 10/14/2012 16:49    MDM   1. Leukocytosis   2. Elevated LFTs   3. Metastatic cancer   4. Dehydration   5. Hyponatremia    History of metastatic lung cancer with worsening LFTs and leukocytosis. Afebrile, vital stable  Patient is not septic or toxic appearing but she is pale and malnourished.  Labs are remarkable for leukocytosis, elevated alkaline phosphatase and bilirubin. Worsening hyponatremia. Suspects hyponatremia worsens dehydration. She is started on IV fluids.  Dr. Gwenyth Bouillon requests right upper quadrant ultrasound to evaluate gallbladder. There is no evidence of gallbladder stone or duct dilation. Bloody urine culture sent for leukocytosis. No obvious evidence of infection. Patient has had ongoing diarrhea and recent antibiotic exposure. We'll check C. Difficile.   Glynn Octave, MD 10/14/12 [phone]

## 2012-10-15 ENCOUNTER — Telehealth: Payer: Self-pay | Admitting: *Deleted

## 2012-10-15 DIAGNOSIS — C3490 Malignant neoplasm of unspecified part of unspecified bronchus or lung: Secondary | ICD-10-CM

## 2012-10-15 DIAGNOSIS — R945 Abnormal results of liver function studies: Secondary | ICD-10-CM | POA: Diagnosis present

## 2012-10-15 DIAGNOSIS — D638 Anemia in other chronic diseases classified elsewhere: Secondary | ICD-10-CM

## 2012-10-15 DIAGNOSIS — N179 Acute kidney failure, unspecified: Secondary | ICD-10-CM

## 2012-10-15 LAB — COMPREHENSIVE METABOLIC PANEL
ALT: 21 U/L (ref 0–35)
AST: 55 U/L — ABNORMAL HIGH (ref 0–37)
CO2: 26 mEq/L (ref 19–32)
Chloride: 89 mEq/L — ABNORMAL LOW (ref 96–112)
Creatinine, Ser: 1.05 mg/dL (ref 0.50–1.10)
GFR calc non Af Amer: 55 mL/min — ABNORMAL LOW (ref >90)
Total Bilirubin: 3.8 mg/dL — ABNORMAL HIGH (ref 0.3–1.2)

## 2012-10-15 LAB — SODIUM, URINE, RANDOM: Sodium, Ur: 25 mEq/L

## 2012-10-15 LAB — PATHOLOGIST SMEAR REVIEW

## 2012-10-15 LAB — CBC
MCH: 31.4 pg (ref 26.0–34.0)
MCHC: 34.8 g/dL (ref 30.0–36.0)
Platelets: 145 10*3/uL — ABNORMAL LOW (ref 150–400)
RBC: 2.58 MIL/uL — ABNORMAL LOW (ref 3.87–5.11)

## 2012-10-15 MED ORDER — SODIUM CHLORIDE 0.9 % IV SOLN
INTRAVENOUS | Status: DC
  Administered 2012-10-15: 1000 mL via INTRAVENOUS

## 2012-10-15 NOTE — Progress Notes (Signed)
Paged NP on call about IVF-order expired after 24 hours. ? Need to continue fluids or not.

## 2012-10-15 NOTE — Care Management Note (Signed)
   CARE MANAGEMENT NOTE 10/15/2012  Patient:  DARIANN, HUCKABA   Account Number:  [id number]  Date Initiated:  10/15/2012  Documentation initiated by:  Makaylie Dedeaux  Subjective/Objective Assessment:   67 yo female admitted with N/V and hyponatremia.     Action/Plan:   Home when stable   Anticipated DC Date:     Anticipated DC Plan:  HOME/SELF CARE      DC Planning Services  CM consult      Choice offered to / List presented to:  NA      DME agency  NA     HH arranged  NA      HH agency  NA   Status of service:  In process, will continue to follow Medicare Important Message given?   (If response is "NO", the following Medicare IM given date fields will be blank) Date Medicare IM given:   Date Additional Medicare IM given:    Discharge Disposition:    Per UR Regulation:  Reviewed for med. necessity/level of care/duration of stay  If discussed at Long Length of Stay Meetings, dates discussed:    Comments:  10/15/12 1134 Liylah Najarro,RN,MSN [phone] Chart reviewed for utilization of services. No barriers to dc or HH needs assessed at this time.

## 2012-10-15 NOTE — Progress Notes (Signed)
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 In ED, CBC revealed WBC count of 52 K, hemoglobin of 8.2 and normal platelet count. BMP revealed sodium of 125. ALP was 1354, AST 54. Abdominal ultrasound did not reveal acute intraabdominal findings and there was no evidence of infection based on UA or CXR.  Assessment/Plan:   Principal Problem:  Nausea with vomiting, dehydration  - secondary to effects of chemotherapy and history of malignancy - continue supportive care with IV fluids and antiemetics as needed - replete electrolytes as needed  Active Problems:  Leukocytosis - unclear etiology - no evidence of infection, CXR WNL and UA negative - follow up blood culture results HYPERLIPIDEMIA  - Continue simvastatin 20 mg daily  HYPERTENSION  - Continue Norvasc, metoprolol  COPD (chronic obstructive pulmonary disease)  - Respiratory status stable  Lung cancer with liver metastasis  - Management per oncology Hyponatremia  - 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 Password [facility] 10/15/2012, 6:39 AM   LOS: 1 day   HPI/Subjective: Says she feels little better this am.  Objective: Filed Vitals:   10/14/12 1516 10/14/12 1520 10/14/12 1855 10/14/12 2144  BP: 139/55  141/47 135/53  Pulse:  73 79 80  Temp:  98 F (36.7 C) 98 F (36.7 C) 98 F (36.7 C)  TempSrc:  Oral Oral Oral  Resp:  16 14 16   Height:   5' 7.5" (1.715 m)   Weight:   54.341 kg (119 lb 12.8 oz) 54.296 kg (119 lb 11.2 oz)  SpO2:  98% 96% 96%    Intake/Output Summary (Last 24 hours) at 10/15/12 8295 Last data filed at 10/14/12 2145  Gross per 24 hour  Intake    120 ml  Output    300 ml  Net   -180 ml    Exam:   General:  Pt is alert, follows commands appropriately, not in acute distress; pale  Cardiovascular: Regular rate and rhythm, S1/S2, no murmurs, no rubs, no gallops  Respiratory: Clear to auscultation bilaterally, no wheezing, no crackles, no rhonchi  Abdomen: Soft, non tender, non distended, bowel sounds present, no guarding  Extremities: No edema, pulses DP and PT palpable bilaterally  Neuro: Grossly nonfocal  Data Reviewed: Basic Metabolic Panel:  Recent Labs Lab 10/14/12 1034 10/14/12 1530 10/15/12 0359  NA 125* 122* 124*  K 3.7 3.8 3.9  CL  --  85* 89*  CO2 28 26 26   GLUCOSE 133 96 86  BUN 16.4 17 16   CREATININE 1.1 1.11*  1.05  CALCIUM 8.1* 8.0* 7.7*   Liver Function Tests:  Recent Labs Lab 10/14/12 1034 10/14/12 1530 10/15/12 0359  AST 54* 53* 55*  ALT 23 22 21   ALKPHOS 1,354* 1272* 1170*  BILITOT 4.04* 4.0* 3.8*  PROT 5.6* 5.4* 4.7*  ALBUMIN 1.6* 1.5* 1.3*    Recent Labs Lab 10/14/12 1530  LIPASE 8*   No results found for this basename: AMMONIA,  in the last 168 hours CBC:  Recent Labs Lab 10/14/12 1034 10/14/12 1530 10/15/12 0359  WBC 52.1* 46.8* 40.0*  NEUTROABS 49.3* 44.5*   --   HGB 8.8* 8.2* 8.1*  HCT 25.0* 23.7* 23.3*  MCV 88.0 89.4 90.3  PLT 173 180 145*   Cardiac Enzymes: No results found for this basename: CKTOTAL, CKMB, CKMBINDEX, TROPONINI,  in the last 168 hours BNP: No components found with this basename: POCBNP,  CBG: No results found for this basename: GLUCAP,  in the last 168 hours  Recent Results (from the past 240 hour(s))  TECHNOLOGIST REVIEW     Status: None   Collection Time    10/14/12 10:34 AM      Result Value Range Status   Technologist Review occ meta   Final     Studies: Dg Chest 2 View 10/14/2012     IMPRESSION: 1. No acute cardiopulmonary disease or significant interval change. 2. Stable chronic interstitial coarsening and biapical scarring, right greater than left.   Electronically Signed   By: Gennette Pac   On: 10/14/2012 16:15   US Abdomen Complete 10/14/2012    IMPRESSION: Numerous small hypoechoic liver lesions are seen leading to a diffusely heterogeneous appearance of the liver. Liver is borderline to mildly enlarged. The findings are consistent with extensive liver metastatic disease.  No acute findings. Gallbladder is contracted but otherwise unremarkable. No bile duct dilation.  Bilateral echogenic kidneys are consistent with medical renal disease.   Electronically Signed   By: Amie Portland   On: 10/14/2012 16:49    Scheduled Meds: . amLODipine  10 mg Oral Daily  . calcium-vitamin D  2 tablet Oral Daily  . metoprolol succinate  50 mg Oral Daily  . multivitamin   1 tablet Oral Daily  . simvastatin  20 mg Oral Daily   Continuous Infusions: . sodium chloride 75 mL/hr at 10/14/12 1859

## 2012-10-15 NOTE — Telephone Encounter (Signed)
Per staff message and POF I have scheduled appts.  JMW  

## 2012-10-16 ENCOUNTER — Ambulatory Visit: Payer: BC Managed Care – PPO | Admitting: Family Medicine

## 2012-10-16 DIAGNOSIS — R7989 Other specified abnormal findings of blood chemistry: Secondary | ICD-10-CM

## 2012-10-16 LAB — CBC
MCH: 31.2 pg (ref 26.0–34.0)
MCHC: 34.4 g/dL (ref 30.0–36.0)
Platelets: 184 10*3/uL (ref 150–400)
RDW: 16.5 % — ABNORMAL HIGH (ref 11.5–15.5)

## 2012-10-16 LAB — URINE CULTURE

## 2012-10-16 LAB — BASIC METABOLIC PANEL
Calcium: 7.7 mg/dL — ABNORMAL LOW (ref 8.4–10.5)
GFR calc non Af Amer: 54 mL/min — ABNORMAL LOW (ref >90)
Sodium: 124 mEq/L — ABNORMAL LOW (ref 135–145)

## 2012-10-16 NOTE — Progress Notes (Signed)
Pt discharged home with her mother in law in stable condition. Discharge instructions given. Pt verbalized understanding.

## 2012-10-16 NOTE — Discharge Summary (Signed)
Physician Discharge Summary  Pamela Levine:[id number] DOB: 1946/01/24 DOA: 10/14/2012  PCP: [name], MD  Admit date: 10/14/2012 Discharge date: 10/16/2012  Recommendations for Outpatient Follow-up:  1. You were hospitalized for leukocytosis (increased white blood cell count). There was no obvious source of infection as your urinalysis was normal and chest x ray did not reveal pneumonia. Also, blood cultures are negative. 2. Please follow up with Dr. Arbutus Ped of oncology and have your blood work (CBC) rechecked during the next visit.  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 In ED, CBC revealed WBC count of 52 K, hemoglobin of 8.2 and normal platelet count. BMP revealed sodium of 125. ALP was 1354, AST 54. Abdominal ultrasound did not reveal acute intraabdominal findings and there was no evidence of infection based on UA or CXR.   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  - Sodium ranges 122 - 124  Acute renal failure  - Secondary to prerenal causes, dehydration  - Creatinine now WNL  Anemia of chronic disease  - Secondary to history of lung cancer and sequela of chemotherapy  - Hemoglobin 7.8 to 8.1; no indications for transfusion; transfuse only if Hgb less than 7 Moderate protein-calorie malnutrition  - Nutrition consulted    Code Status:full code  Family Communication: husband at the bedside    Consultants:  None  Procedures:  None  Antibiotics:  None    Signed:  [name], MD  [facility] 10/16/2012, 11:01 AM  Pager #: [phone]   Discharge Exam: Filed Vitals:   10/16/12 0606  BP: 150/57  Pulse: 86  Temp: 98 F (36.7 C)  Resp: 16   Filed Vitals:   10/15/12 2058 10/16/12 0606 10/16/12 0615 10/16/12 0618  BP: 159/58 150/57    Pulse: 91 86    Temp: 98.2 F (36.8 C) 98 F (36.7 C)    TempSrc: Oral Oral    Resp: 16 16    Height:      Weight:      SpO2: 96% 94% 88% 93%    General: Pt is alert, follows commands appropriately, not in acute distress Cardiovascular: Regular rate and rhythm, S1/S2 +, no murmurs,  no rubs, no gallops Respiratory: Clear to auscultation bilaterally, no wheezing, no crackles, no rhonchi Abdominal: Soft, non tender, non distended, bowel sounds +, no guarding Extremities: no edema, no cyanosis, pulses palpable bilaterally DP and PT Neuro: Grossly nonfocal  Discharge Instructions  Discharge Orders   Future Appointments Provider Department Dept Phone   10/21/2012 11:15 AM [name]  [phone]   10/21/2012 11:45 AM [name] [facility] [phone]   10/28/2012 9:00 AM [name] [phone]   11/04/2012 9:00 AM Mauri [name] [phone]   11/04/2012 9:30 AM [name], MD [place] [facility] [phone]   11/04/2012 10:30 AM [facility] [address] [place] [facility] [phone]   11/11/2012 11:45 AM [facility] [place] [facility] [phone]   Future Orders Complete By Expires   Call MD for:  difficulty breathing, headache or visual disturbances  As directed    Call MD for:  persistant dizziness or light-headedness  As directed    Call MD for:  persistant nausea and vomiting  As directed    Call MD for:  severe uncontrolled pain  As directed    Diet - low sodium heart healthy  As directed    Discharge instructions  As directed    Comments:     1. You were hospitalized for leukocytosis (increased white blood cell count). There was no obvious source of infection as your urinalysis was normal and chest x ray did not reveal pneumonia. Also, blood cultures are negative. 2. Please follow up with Dr. Arbutus Ped of oncology and have your blood work (CBC) rechecked during the next visit.   Increase activity slowly  As directed        Medication List         amLODipine 10 MG tablet  Commonly known as:  NORVASC  Take 10 mg by mouth daily.     CALTRATE 600+D 600-400 MG-UNIT per tablet  Generic drug:  Calcium Carbonate-Vitamin D  Take 2 tablets by mouth daily.     dextromethorphan-guaiFENesin 30-600 MG per 12 hr tablet  Commonly known as:  MUCINEX DM  Take 1 tablet by mouth 2 (two) times daily as needed. Congestion     feeding supplement Liqd  Take 1 Container by mouth 2 (two) times daily as needed. Pt does not like to take     LORazepam 1 MG tablet   Commonly known as:  ATIVAN  Take 1 mg by mouth every 8 (eight) hours as needed for anxiety (Give with Zofran to augment antiemetic effect.).     methylcellulose 1 % ophthalmic solution  Commonly known as:  ARTIFICIAL TEARS  Place 1 drop into both eyes 2 (two) times daily as needed (dry eyes).     metoprolol succinate 50 MG 24 hr tablet  Commonly known as:  TOPROL-XL  Take 50 mg by mouth daily. Take with or immediately following a meal.     multivitamin with minerals Tabs tablet  Take 1 tablet by mouth daily.     ondansetron 8 MG tablet  Commonly known as:  ZOFRAN  Take 8 mg by mouth every 8 (eight) hours as needed for nausea.     PRESCRIPTION MEDICATION  Inject into the vein as directed. Gemzar and Paraplatin infusion on weeks 1 and 2 of 3 week cycle.     simvastatin  20 MG tablet  Commonly known as:  ZOCOR  Take 20 mg by mouth daily.     traMADol 50 MG tablet  Commonly known as:  ULTRAM  Take 50 mg by mouth every 8 (eight) hours as needed for pain.           Follow-up Information   Follow up with [name], MD In 1 week.   Specialties:  Family Medicine, Radiology   Contact information:   [address] [place] [address] [place] [address] [phone]        The results of significant diagnostics from this hospitalization (including imaging, microbiology, ancillary and laboratory) are listed below for reference.    Significant Diagnostic Studies: Dg Chest 2 View  10/14/2012   CLINICAL DATA:  Hypertension. Leukocytosis.  Initial encounter.  EXAM: CHEST  2 VIEW  COMPARISON:  Two-view chest x-ray 09/29/2012.  FINDINGS: The heart size is normal. Emphysematous changes are again noted. Chronic interstitial coarsening is evident. Biapical scarring is stable.  IMPRESSION: 1. No acute cardiopulmonary disease or significant interval change. 2. Stable chronic interstitial coarsening and biapical scarring, right greater than left.   Electronically Signed    By: Gennette Pac   On: 10/14/2012 16:15   Dg Chest 2 View  09/29/2012   *RADIOLOGY REPORT*  Clinical Data: Fever.  Smoker.  CHEST - 2 VIEW  Comparison: Chest CT 08/26/2012.  Chest x-ray 12/10/2011  Findings: COPD.  Apical pleural and parenchymal scarring bilaterally is stable.  Cardiac enlargement without heart failure.  Negative for pneumonia or effusion.  IMPRESSION: COPD with apical scarring.  No acute abnormality.   Original Report Authenticated By: Janeece Riggers, M.D.   US Abdomen Complete  10/14/2012   CLINICAL DATA:  Elevated liver function tests. History of lung carcinoma.  EXAM: ABDOMEN ULTRASOUND  COMPARISON:  None.  FINDINGS: Gallbladder  Gallbladder is mostly contracted. Is otherwise unremarkable.  Common bile duct  Diameter: No bile duct dilation. Common bile duct measures 6.5 mm.  Liver  There are numerous hypoechoic masses throughout the liver leading to a diffusely heterogeneous echogenicity. The surface is nodular. The liver appears borderline to mildly enlarged. Hepatopetal flow is documented in the portal vein.  IVC  No abnormality visualized.  Pancreas  Not well visualized.  Spleen  Normal in size and echogenicity measuring 8.2 cm.  Right Kidney  Length: 14.6 cm. The right kidney is of increased echogenicity diffusely. No mass or hydronephrosis.  Left Kidney  Length: 11.3 cm. Increased echogenicity diffusely. No mass or hydronephrosis.  Abdominal aorta  Normal in caliber proximally. Not well seen distally.  IMPRESSION: Numerous small hypoechoic liver lesions are seen leading to a diffusely heterogeneous appearance of the liver. Liver is borderline to mildly enlarged. The findings are consistent with extensive liver metastatic disease.  No acute findings. Gallbladder is contracted but otherwise unremarkable. No bile duct dilation.  Bilateral echogenic kidneys are consistent with medical renal disease.   Electronically Signed   By: Amie Portland   On: 10/14/2012 16:49     Microbiology: Recent Results (from the past 240 hour(s))  TECHNOLOGIST REVIEW     Status: None   Collection Time    10/14/12 10:34 AM      Result Value Range Status   Technologist Review occ meta   Final  CULTURE, BLOOD (ROUTINE X 2)     Status: None   Collection Time    10/14/12  3:36 PM      Result Value Range  Status   Specimen Description BLOOD RIGHT ANTECUBITAL   Final   Special Requests     Final   Value: Immunocompromised BOTTLES DRAWN AEROBIC AND ANAEROBIC 5CC   Culture  Setup Time     Final   Value: 10/15/2012 03:42     Performed at [facility]   Culture     Final   Value:        BLOOD CULTURE RECEIVED NO GROWTH TO DATE CULTURE WILL BE HELD FOR 5 DAYS BEFORE ISSUING A FINAL NEGATIVE REPORT     Performed at [facility]   Report Status PENDING   Incomplete  CULTURE, BLOOD (ROUTINE X 2)     Status: None   Collection Time    10/14/12  3:41 PM      Result Value Range Status   Specimen Description BLOOD RIGHT ARM   Final   Special Requests BOTTLES DRAWN AEROBIC ONLY 3CC   Final   Culture  Setup Time     Final   Value: 10/15/2012 03:42     Performed at [facility]   Culture     Final   Value:        BLOOD CULTURE RECEIVED NO GROWTH TO DATE CULTURE WILL BE HELD FOR 5 DAYS BEFORE ISSUING A FINAL NEGATIVE REPORT     Performed at [facility]   Report Status PENDING   Incomplete  URINE CULTURE     Status: None   Collection Time    10/14/12  5:28 PM      Result Value Range Status   Specimen Description URINE, CATHETERIZED   Final   Special Requests Immunocompromised   Final   Culture  Setup Time     Final   Value: 10/15/2012 01:26     Performed at [facility] Count     Final   Value: NO GROWTH     Performed at [facility]   Culture     Final   Value: NO GROWTH     Performed at [facility]   Report Status 10/16/2012 FINAL   Final     Labs: Basic Metabolic Panel:  Recent Labs Lab  10/14/12 1034 10/14/12 1530 10/15/12 0359 10/16/12 0347  NA 125* 122* 124* 124*  K 3.7 3.8 3.9 4.0  CL  --  85* 89* 91*  CO2 28 26 26 23   GLUCOSE 133 96 86 82  BUN 16.4 17 16 15   CREATININE 1.1 1.11* 1.05 1.06  CALCIUM 8.1* 8.0* 7.7* 7.7*   Liver Function Tests:  Recent Labs Lab 10/14/12 1034 10/14/12 1530 10/15/12 0359  AST 54* 53* 55*  ALT 23 22 21   ALKPHOS 1,354* 1272* 1170*  BILITOT 4.04* 4.0* 3.8*  PROT 5.6* 5.4* 4.7*  ALBUMIN 1.6* 1.5* 1.3*    Recent Labs Lab 10/14/12 1530  LIPASE 8*   No results found for this basename: AMMONIA,  in the last 168 hours CBC:  Recent Labs Lab 10/14/12 1034 10/14/12 1530 10/15/12 0359 10/16/12 0347  WBC 52.1* 46.8* 40.0* 42.1*  NEUTROABS 49.3* 44.5*  --   --   HGB 8.8* 8.2* 8.1* 7.8*  HCT 25.0* 23.7* 23.3* 22.7*  MCV 88.0 89.4 90.3 90.8  PLT 173 180 145* 184   Cardiac Enzymes: No results found for this basename: CKTOTAL, CKMB, CKMBINDEX, TROPONINI,  in the last 168 hours BNP: BNP (last 3 results) No results found for this basename: PROBNP,  in the last  8760 hours CBG: No results found for this basename: GLUCAP,  in the last 168 hours  Time coordinating discharge: Over 30 minutes

## 2012-10-16 NOTE — Care Management Note (Signed)
Cm spoke with the patient at bedside to assess dc needs. Pt states being independent. Provides self transportation to MD appointments. PCP: [name], MD . Pt uses [facility] [address] as pharmacy and mail order rx. Family to provide tx home.   [name] Estreya Clay,RN,MSN [phone]

## 2012-10-20 ENCOUNTER — Ambulatory Visit (INDEPENDENT_AMBULATORY_CARE_PROVIDER_SITE_OTHER): Payer: BC Managed Care – PPO | Admitting: Family Medicine

## 2012-10-20 ENCOUNTER — Encounter: Payer: Self-pay | Admitting: Family Medicine

## 2012-10-20 VITALS — BP 122/56 | HR 99 | Temp 97.6°F | Ht 67.75 in | Wt 128.0 lb

## 2012-10-20 DIAGNOSIS — R945 Abnormal results of liver function studies: Secondary | ICD-10-CM

## 2012-10-20 DIAGNOSIS — R112 Nausea with vomiting, unspecified: Secondary | ICD-10-CM

## 2012-10-20 DIAGNOSIS — E871 Hypo-osmolality and hyponatremia: Secondary | ICD-10-CM

## 2012-10-20 DIAGNOSIS — D638 Anemia in other chronic diseases classified elsewhere: Secondary | ICD-10-CM

## 2012-10-20 DIAGNOSIS — D72829 Elevated white blood cell count, unspecified: Secondary | ICD-10-CM

## 2012-10-20 MED ORDER — TRAMADOL HCL 50 MG PO TABS: 50.0000 mg | ORAL_TABLET | Freq: Two times a day (BID) | ORAL | Status: AC | PRN

## 2012-10-20 NOTE — Patient Instructions (Addendum)
Avoid any tylenol (acetaminophen) products  Avoid alcohol  Take tramadol up to 1 pill every 12 hours as needed for pain  Follow up as planned with oncology tomorrow- be sure to discuss your recent hospitalization and also future options re: possible opinion at a tertiary care center like [facility] or [facility] - , or involvement in a study if available  If symptoms worsen such as nausea or vomiting - go to ER If fever also go to ER

## 2012-10-20 NOTE — Progress Notes (Signed)
Subjective:    Patient ID: Pamela Levine, female    DOB: 01-06-1946, 67 y.o.   MRN: [id number]  HPI Here for hospital f/u   Pt hosp d/c on 9/19  Was there for leukocytosis/ hyponatremia / nausea with vomiting on chemotx for lung cancer   Lab Results  Component Value Date   WBC 42.1* 10/16/2012   HGB 7.8* 10/16/2012   HCT 22.7* 10/16/2012   MCV 90.8 10/16/2012   PLT 184 10/16/2012   cxr-no infiltrate, urine clear and blood cx clear   Lab Results  Component Value Date   WBC 42.1* 10/16/2012   HGB 7.8* 10/16/2012   HCT 22.7* 10/16/2012   MCV 90.8 10/16/2012   PLT 184 10/16/2012       Chemistry      Component Value Date/Time   NA 124* 10/16/2012 0347   NA 125* 10/14/2012 1034   K 4.0 10/16/2012 0347   K 3.7 10/14/2012 1034   CL 91* 10/16/2012 0347   CL 103 05/27/2012 0854   CO2 23 10/16/2012 0347   CO2 28 10/14/2012 1034   BUN 15 10/16/2012 0347   BUN 16.4 10/14/2012 1034   CREATININE 1.06 10/16/2012 0347   CREATININE 1.1 10/14/2012 1034      Component Value Date/Time   CALCIUM 7.7* 10/16/2012 0347   CALCIUM 8.1* 10/14/2012 1034   ALKPHOS 1170* 10/15/2012 0359   ALKPHOS 1,354* 10/14/2012 1034   AST 55* 10/15/2012 0359   AST 54* 10/14/2012 1034   ALT 21 10/15/2012 0359   ALT 23 10/14/2012 1034   BILITOT 3.8* 10/15/2012 0359   BILITOT 4.04* 10/14/2012 1034     sodium is stable-thought to be from vomiting and also 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   High liver enzymes Lab Results  Component Value Date   ALT 21 10/15/2012   AST 55* 10/15/2012   ALKPHOS 1170* 10/15/2012   BILITOT 3.8* 10/15/2012   very high alk phos  Pt has cancer and also on chemo  In addition-on ultrasound - lesions in liver are suspect for metastasis and CBD looks ok and unobstructed  No known bony metastasis   At this point pt is concerned that she is not tolerating chemotx  She thinks her current oncologist has nothing left to offer-esp due to liver mets  She is interested in a 2nd opinion-perhaps from tertiary care  center like [facility] or [facility] or poss cancer centers of [place] - IF no other options remain with current care  She will investigate these and talk to her oncology team about it at her appt tomorrow  Patient Active Problem List   Diagnosis Date Noted  . Abnormal LFTs 10/15/2012  . Leukocytosis, unspecified 09/30/2012  . Moderate protein-calorie malnutrition 09/30/2012  . Nausea with vomiting 09/29/2012  . Dehydration 09/29/2012  . Anemia of chronic disease 09/25/2012  . Hyponatremia 04/13/2012  . Lung cancer 12/19/2011  . COPD (chronic obstructive pulmonary disease) 11/05/2011  . HYPERLIPIDEMIA 10/17/2006  . HYPERTENSION 10/17/2006   Past Medical History  Diagnosis Date  . Hyperlipidemia   . Hypertension   . History  of basal cell carcinoma   . Light cigarette smoker   . Kidney stone 01/2000    stent. Renal U/S normal in 12/01.  Marland Kitchen Heart murmur   . Arthritis   . Osteoarthritis   . Lung mass October 2013    RUL  . COPD (chronic obstructive pulmonary disease)   . Ascending aortic aneurysm   . Cancer  skin  . Lung cancer 12/19/2011  . Hx of radiation therapy 01/01/12- 02/21/12    right upper chest region, 63 gray, 35 fx   Past Surgical History  Procedure Laterality Date  . Partial hysterectomy  1978    bleeding  . Kidney stone surgery  01/2000    stent  . Colonoscopy    . Video bronchoscopy with endobronchial ultrasound  11/27/2011    Procedure: VIDEO BRONCHOSCOPY WITH ENDOBRONCHIAL ULTRASOUND;  Surgeon: Leslye Peer, MD;  Location: [facility] OR;  Service: Pulmonary;  Laterality: N/A;   History  Substance Use Topics  . Smoking status: Former Smoker -- 25 years    Types: Cigarettes    Quit date: 01/29/2007  . Smokeless tobacco: Not on file  . Alcohol Use: Yes     Comment: occasional   Family History  Problem Relation Age of Onset  . Lung cancer Mother     lung- non smoker  . Hypertension Father   . Stroke Father     cerebral hemorrhage  . Lung cancer Sister     lung   . Diabetes Son    Allergies  Allergen Reactions  . Ace Inhibitors     Cough    Current Outpatient Prescriptions on File Prior to Visit  Medication Sig Dispense Refill  . amLODipine (NORVASC) 10 MG tablet Take 10 mg by mouth daily.      . Calcium Carbonate-Vitamin D (CALTRATE 600+D) 600-400 MG-UNIT per tablet Take 2 tablets by mouth daily.       Marland Kitchen dextromethorphan-guaiFENesin (MUCINEX DM) 30-600 MG per 12 hr tablet Take 1 tablet by mouth 2 (two) times daily as needed. Congestion      . feeding supplement (RESOURCE BREEZE) LIQD Take 1 Container by mouth 2 (two) times daily as needed. Pt does not like to take      . LORazepam (ATIVAN) 1 MG tablet Take 1 mg by mouth every 8 (eight) hours as needed for anxiety (Give with Zofran to augment antiemetic effect.).      Marland Kitchen methylcellulose (ARTIFICIAL TEARS) 1 % ophthalmic solution Place 1 drop into both eyes 2 (two) times daily as needed (dry eyes).      . metoprolol succinate (TOPROL-XL) 50 MG 24 hr tablet Take 50 mg by mouth daily. Take with or immediately following a meal.      . Multiple Vitamin (MULTIVITAMIN WITH MINERALS) TABS tablet Take 1 tablet by mouth daily.      . ondansetron (ZOFRAN) 8 MG tablet Take 8 mg by mouth every 8 (eight) hours as needed for nausea.      Marland Kitchen PRESCRIPTION MEDICATION Inject into the vein as directed. Gemzar and Paraplatin infusion on weeks 1 and 2 of 3 week cycle.      . simvastatin (ZOCOR) 20 MG tablet Take 20 mg by mouth daily.       No current facility-administered medications on file prior to visit.    Review of Systems Review of Systems  Constitutional: nef for fever/ pos for wt loss/ malaise/ dec appetitie.  Eyes: Negative for pain and visual disturbance.  Respiratory: Negative for cough and shortness of breath.   Cardiovascular: Negative for cp or palpitations   neg for PND or orthopnea/ pos for pedal edema  Gastrointestinal: Negative for , diarrhea and constipation. pos for nausea (no vomiting  today) Genitourinary: Negative for urgency and frequency.  Skin: Negative for pallor or rash  pos for jaundice  Neurological: Negative for weakness, light-headedness, numbness and headaches.  Hematological:  Negative for adenopathy. Does not bruise/bleed easily.  Psychiatric/Behavioral: Negative for dysphoric mood. The patient is not nervous/anxious.         Objective:   Physical Exam  Constitutional: She appears well-developed and well-nourished. No distress.  underwt chronically ill app female with jaundice   HENT:  Head: Normocephalic and atraumatic.  Mouth/Throat: Oropharynx is clear and moist.  Eyes: EOM are normal. Pupils are equal, round, and reactive to light. Scleral icterus is present.  Neck: Normal range of motion. Neck supple. No thyromegaly present.  Cardiovascular: Regular rhythm and intact distal pulses.   Pulmonary/Chest: Effort normal and breath sounds normal. No respiratory distress. She has no wheezes. She has no rales. She exhibits tenderness.  Mild R lower cw tenderness  Abdominal: Soft. Bowel sounds are normal. She exhibits distension. She exhibits no mass. There is no tenderness. There is no rebound and no guarding.  Some soft distension without ascites appreciated today  Musculoskeletal: She exhibits edema.  One plus pedal edema  Lymphadenopathy:    She has no cervical adenopathy.  Neurological: She is alert. She has normal reflexes. No cranial nerve deficit. She exhibits normal muscle tone. Coordination normal.  Skin: Skin is warm and dry. There is pallor.  Jaundice noted   Psychiatric: She has a normal mood and affect.          Assessment & Plan:

## 2012-10-21 ENCOUNTER — Encounter: Payer: Self-pay | Admitting: Medical Oncology

## 2012-10-21 ENCOUNTER — Other Ambulatory Visit: Payer: Self-pay | Admitting: Internal Medicine

## 2012-10-21 ENCOUNTER — Ambulatory Visit: Payer: BC Managed Care – PPO

## 2012-10-21 ENCOUNTER — Other Ambulatory Visit (HOSPITAL_BASED_OUTPATIENT_CLINIC_OR_DEPARTMENT_OTHER): Payer: BC Managed Care – PPO | Admitting: Lab

## 2012-10-21 DIAGNOSIS — C3490 Malignant neoplasm of unspecified part of unspecified bronchus or lung: Secondary | ICD-10-CM

## 2012-10-21 DIAGNOSIS — C3410 Malignant neoplasm of upper lobe, unspecified bronchus or lung: Secondary | ICD-10-CM

## 2012-10-21 LAB — CBC WITH DIFFERENTIAL/PLATELET
BASO%: 0.4 % (ref 0.0–2.0)
Eosinophils Absolute: 0 10*3/uL (ref 0.0–0.5)
MCV: 93.2 fL (ref 79.5–101.0)
MONO#: 1.4 10*3/uL — ABNORMAL HIGH (ref 0.1–0.9)
MONO%: 3.1 % (ref 0.0–14.0)
NEUT#: 43.4 10*3/uL — ABNORMAL HIGH (ref 1.5–6.5)
RBC: 2.4 10*6/uL — ABNORMAL LOW (ref 3.70–5.45)
RDW: 17.9 % — ABNORMAL HIGH (ref 11.2–14.5)
WBC: 45.6 10*3/uL — ABNORMAL HIGH (ref 3.9–10.3)

## 2012-10-21 LAB — COMPREHENSIVE METABOLIC PANEL (CC13)
ALT: 26 U/L (ref 0–55)
Albumin: 1.2 g/dL — ABNORMAL LOW (ref 3.5–5.0)
Alkaline Phosphatase: 1242 U/L — ABNORMAL HIGH (ref 40–150)
Glucose: 113 mg/dl (ref 70–140)
Potassium: 3.8 mEq/L (ref 3.5–5.1)
Sodium: 125 mEq/L — ABNORMAL LOW (ref 136–145)
Total Protein: 4.7 g/dL — ABNORMAL LOW (ref 6.4–8.3)

## 2012-10-21 LAB — CULTURE, BLOOD (ROUTINE X 2): Culture: NO GROWTH

## 2012-10-21 NOTE — Assessment & Plan Note (Signed)
Likely due to liver mets No CBD abn on [place] but pt is jaundiced with high bilirubin  She will disc with her oncologist

## 2012-10-21 NOTE — Assessment & Plan Note (Signed)
For f/u with oncol tomorrow She is not tolerating chemo well  Wants to disc all options incl 2nd opinion at this time

## 2012-10-21 NOTE — Assessment & Plan Note (Signed)
No source of infection found in hospital Suspect chemo related  For oncology f/u tomorrow -pt is considering a 2nd opinion

## 2012-10-21 NOTE — Assessment & Plan Note (Signed)
Suspect chemo related Calm today- on zofran  Appetite is very poor  Low protein level and edema as well

## 2012-10-21 NOTE — Assessment & Plan Note (Signed)
Malignancy and chemo Plan in hosp was to transfuse for Hb under 7  Lab Results  Component Value Date   WBC 42.1* 10/16/2012   HGB 7.8* 10/16/2012   HCT 22.7* 10/16/2012   MCV 90.8 10/16/2012   PLT 184 10/16/2012    For re check tomorrow at [facility]

## 2012-10-21 NOTE — Assessment & Plan Note (Signed)
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

## 2012-10-21 NOTE — Progress Notes (Signed)
Pt presents to infusion room and tells me she does not want chemotherapy today and to cancel all her infusion appointments for chemotherapy. She requests second opinion at  [facility] . Information given to Dr Asa Lente nurse. ONC TX request sent.

## 2012-10-22 ENCOUNTER — Telehealth: Payer: Self-pay | Admitting: *Deleted

## 2012-10-22 NOTE — Telephone Encounter (Signed)
Pt is requesting a 2nd opinion to Dr. Westly Pam at [facility].  Emailed Tiffany in medical records.  SLJ

## 2012-10-26 ENCOUNTER — Telehealth: Payer: Self-pay | Admitting: Family Medicine

## 2012-10-26 ENCOUNTER — Telehealth: Payer: Self-pay | Admitting: Internal Medicine

## 2012-10-26 ENCOUNTER — Encounter: Payer: Self-pay | Admitting: Family Medicine

## 2012-10-26 ENCOUNTER — Telehealth: Payer: Self-pay | Admitting: *Deleted

## 2012-10-26 MED ORDER — PROCHLORPERAZINE MALEATE 10 MG PO TABS: 10.0000 mg | ORAL_TABLET | Freq: Four times a day (QID) | ORAL | Status: AC | PRN

## 2012-10-26 NOTE — Telephone Encounter (Signed)
My chart message req refill for n/v

## 2012-10-26 NOTE — Telephone Encounter (Signed)
Patient called and canceled her appt tomorrow. She stated that she will call back to reschedule.  JMW

## 2012-10-26 NOTE — Telephone Encounter (Signed)
, °

## 2012-10-28 ENCOUNTER — Other Ambulatory Visit: Payer: BC Managed Care – PPO | Admitting: Lab

## 2012-10-28 ENCOUNTER — Other Ambulatory Visit: Payer: Self-pay

## 2012-10-28 ENCOUNTER — Ambulatory Visit: Payer: BC Managed Care – PPO

## 2012-10-28 DIAGNOSIS — I712 Thoracic aortic aneurysm, without rupture: Secondary | ICD-10-CM

## 2012-11-04 ENCOUNTER — Telehealth: Payer: Self-pay

## 2012-11-04 ENCOUNTER — Other Ambulatory Visit: Payer: BC Managed Care – PPO | Admitting: Lab

## 2012-11-04 ENCOUNTER — Ambulatory Visit: Payer: BC Managed Care – PPO

## 2012-11-04 ENCOUNTER — Ambulatory Visit: Payer: BC Managed Care – PPO | Admitting: Internal Medicine

## 2012-11-04 NOTE — Telephone Encounter (Signed)
Please give verbal order for both per protocol- I hope she improves and if they need more specific orders let me know  Keep me posted please on how she is doing

## 2012-11-04 NOTE — Telephone Encounter (Signed)
Pamela Levine [facility] with [facility]  saw pt on 11/03/12; pt having nausea and vomiting which is causing pt not to eat.Compazine is not effective. Pamela Levine ask for verbal order for Zofran and Prilosec. Please advise.

## 2012-11-04 NOTE — Telephone Encounter (Signed)
Left voicemail giving Aggie Cosier verbal order and let her know Dr. Royden Purl comments

## 2012-11-09 ENCOUNTER — Telehealth: Payer: Self-pay | Admitting: Nutrition

## 2012-11-09 NOTE — Telephone Encounter (Signed)
I spoke with patient's husband this morning on the telephone to follow up on possible nutritional needs.  Patient is now enrolled with Hospice.  Patient reports he is very pleased with their services and has no needs from our team at [facility] in [place].  Patient verbalized he is coping well now that he knows the patient is comfortable.  He has Ensure at home to offer and denies nutrition needs.  He expressed appreciation for call.

## 2012-11-11 ENCOUNTER — Ambulatory Visit: Payer: BC Managed Care – PPO

## 2012-11-14 DIAGNOSIS — D638 Anemia in other chronic diseases classified elsewhere: Secondary | ICD-10-CM

## 2012-11-14 DIAGNOSIS — C3490 Malignant neoplasm of unspecified part of unspecified bronchus or lung: Secondary | ICD-10-CM

## 2012-11-14 DIAGNOSIS — R7989 Other specified abnormal findings of blood chemistry: Secondary | ICD-10-CM

## 2012-11-14 DIAGNOSIS — R112 Nausea with vomiting, unspecified: Secondary | ICD-10-CM

## 2012-11-14 DIAGNOSIS — E871 Hypo-osmolality and hyponatremia: Secondary | ICD-10-CM

## 2012-11-14 DIAGNOSIS — D72829 Elevated white blood cell count, unspecified: Secondary | ICD-10-CM

## 2012-11-18 ENCOUNTER — Other Ambulatory Visit: Payer: BC Managed Care – PPO

## 2012-11-18 ENCOUNTER — Ambulatory Visit: Payer: BC Managed Care – PPO | Admitting: Surgery

## 2012-11-28 DEATH — deceased

## 2012-11-30 ENCOUNTER — Telehealth: Payer: Self-pay | Admitting: Family Medicine

## 2012-11-30 NOTE — Telephone Encounter (Signed)
Spoke to pt's husband Pamela Levine and she passed away on Nov 09, 2024after being admitted to hospice on Oct 14th  Please change status of chart to [facility] you

## 2012-11-30 NOTE — Telephone Encounter (Signed)
Pt's husband, Aurther Loft, dropped of [employer] forms to be completed by Dr. Milinda Antis. The best numbers to reach Mr. Fiske is at home [phone] or cell # [phone].

## 2012-11-30 NOTE — Telephone Encounter (Signed)
Form placed in your inbox

## 2012-12-01 NOTE — Telephone Encounter (Signed)
Status changed.

## 2013-09-21 ENCOUNTER — Other Ambulatory Visit: Payer: Self-pay | Admitting: Pharmacist

## 2014-05-20 NOTE — Discharge Summary (Signed)
PATIENT NAME:  Pamela Levine, Pamela Levine MR#:  [id number] DATE OF BIRTH:  [birth year]  DATE OF ADMISSION:  09/19/2012 DATE OF DISCHARGE:  09/22/2012  ADMITTING DIAGNOSIS: Fever.    DISCHARGE DIAGNOSES:   1.  Fever.  2.  Suspected pneumonia.  3.  Leukocytosis, resolved on antibiotic therapy.  4.  Renal failure, likely chronic.  5.  Elevated transaminases due to metastasis to liver, stable.  6. Anemia of chronic disease, status post 1 unit of packed red blood cell transfusion on 09/22/2012.   7.  Severe protein and calorie malnutrition.  8.  History of metastatic lung carcinoma.  9.  Hypertension.  10.  Hyperlipidemia.  11.  Chronic back pain.   DISCHARGE CONDITION: Stable.   DISCHARGE MEDICATIONS: The patient is to resume:  1.  Amlodipine10 mg p.o. daily.  2.  Artificial tears 1 drop to each affected eye 3 times daily as needed.  3.  Aspirin 81 mg daily.   4.  Calcium with vitamin D 600/400, 2 tablets once daily.  5.  Iron sulfate 325 mg once daily.  6.  Metoprolol succinate 50 mg p.o. daily.  7.  Multivitamin with minerals 1 tablet once daily.  8.  Simvastatin 10 mg p.o. at bedtime.  9.  Oxycodone 5 mg every 6 hours as needed.  10.  Levaquin 500 mg p.o. daily for 5 more days.   HOME OXYGEN: None.   DIET: 2 grams salt, low-fat, low-cholesterol, dietary supplements Ensure 3 times a day.   DIET CONSISTENCY: Regular.   ACTIVITY LIMITATIONS: As tolerated.   FOLLOWUP APPOINTMENT: Dr. Loura Pardon  in 2 days after discharge, as well as Dr. Julien Nordmann in 2 days after discharge.   CONSULTANTS:  1.  Care management.  2.  Dr. Grayland Ormond.   RADIOLOGIC STUDIES: Chest portable, single view on 09/03/2012 showed mildly increased perihilar lung markings on the left as well as in the left upper lobe. No discrete focal alveolar infiltrate. Follow-up PA and lateral chest x-rays would be of  value, according to radiologist.   Ultrasound of abdomen, general survey, 09/20/2012, showed multiple hepatic  masses consistent with metastatic disease. No acute abnormality of the gallbladder, spleen or common bile duct. Evaluation of pancreas was limited. Increased echotexture of the kidneys (Dictation Anomaly)consistent with  medical renal disease. There is atherosclerotic calcification in the abdominal aorta.   HOSPITAL COURSE:  The patient is a 69 year old Caucasian female with a history of metastatic lung carcinoma who is on palliative chemotherapy at [facility] , presents to the hospital with complaints of fevers, as well as chills. Please refer to Dr. Ward Givens admission note on 09/19/2012. Apparently, the patient just received chemotherapy a few days ago, and presented to Emergency Room with fevers and chills, which were as high as 102. She called her primary oncologist, who recommended for patient to come to the hospital. On arrival to the Emergency Room, she was noted to have markedly elevated white blood cell count at 22,000 and her vital signs showed temperature of 102, pulse was 89, blood pressure was 154/68, respiration rate was 18, O2 saturation was 94% on oxygen therapy. Her physical exam showed  crackles in the left lower lobe. The patient's lab data done in the hospital in the Emergency Room revealed mild elevation of BUN and creatinine to 34 and 1.61, sodium 131, otherwise BMP was unremarkable. The patient's liver enzymes revealed albumin level of 2.2, alkaline phosphatase 964, AST was 259 and ALT was 174. Troponin was mildly elevated at 0.15.  The patient's white blood cell count was elevated at 27.6, hemoglobin was 8.3 and platelet count was 252. Absolute neutrophil count was high at 26.4. Blood cultures taken on the 09/18/2012 showed no growth. Urine culture  also showed (Dictation Anomaly)mixed bacterial organisms, results suggestive of contamination. Urinalysis was unremarkable. The patient's lactic acid level was 0.7. EKG showed normal sinus rhythm at 90 beats per minute, possible left atrial  enlargement, borderline EKG with no acute ST-T changes. Chest x-ray was concerning for possible pneumonitis or pneumonia in the left lobe. The patient was admitted to the hospital for further evaluation. She was initially started on broad-spectrum antibiotics including vancomycin as well as cefepime; however, later on because of thought that very likely the patient's fever was related to pneumonia, we changed her to Levaquin. She is to continue antibiotics for the next 5 to 6 days to complete 7-day course. Her fever subsided and her white blood cell count normalized by the day of discharge. We were not able; however, to get sputum cultures and we made the decision to continue antibiotic therapy for 7 days to complete the course. In regards to renal failure, it was felt that this was chronic. We tried IV fluids to improve her kidney function; however, even with IV fluid administration as well as blood transfusion, the patient's kidney function did not change. On the day of discharge, the patient's BUN and creatinine were 26 and 1.70, fluctuating between 1.58 to 1.83. We did ultrasound of abdomen, which showed no hydronephrosis; however, medical renal disease was suggested. In regards to elevated transaminases, it was felt to be due to metastasis to liver. The patient's liver enzymes were elevated, and they were stable. In regards to anemia, with rehydration, we revealed that patient was significantly anemic with hemoglobin level of 6.8. On the 09/22/2012, she was transfused with 1 unit of packed red blood cells, after which patient's hemoglobin level improved to 8.2 In regards to stage IV lung carcinoma. The patient was evaluated by our oncologist, who recognized that the patient was actively receiving chemotherapy in [facility] under the direction of Dr. Julien Nordmann. Apparently she received her last treatment on 09/16/2012 with carboplatin as well as Taxol. Next treatment was supposed to be scheduled for tomorrow  09/23/2012 with gemcitabine only, but it was felt that patient needs to delay this treatment due to underlying kidney function as well as anemia. Dr. Worthy Flank office was contacted and they will call patient for her next appointment. In regards to anemia, it was felt to be due to chemotherapy. Dr. Grayland Ormond agreed with 1 unit of packed red blood cell transfusion. He felt that patient should be discharged after transfusion with follow-up of her labs as outpatient. In regards to severe protein calorie malnutrition, we felt the patient should be supplemented with Ensure at least 3 times a day for which she was agreeable. In regards to chronic metastatic lung carcinoma, stage IV, the patient is to return back to Dr. Worthy Flank and as mentioned above, the patient's treatment, which was scheduled to be on 09/23/2012, likely will be delayed due to recent infection. For hypertension, hyperlipidemia, as well as back pain, the patient is to continue her outpatient management. We added oxycodone to her pain management due to poor response to conventional therapy with Tylenol alone.   On the day of discharge the patient felt satisfactory, did not complain of any significant discomfort. She is being discharged in stable condition with above-mentioned medications and follow-up. Her vital signs on the day of  discharge: Temperature was 98.1, pulse was 74, respiratory rate was 18, blood pressure 150/64, saturation was 97% on room air at rest.   TIME SPENT: 40 minutes  ____________________________ Theodoro Grist, MD rv:cc D: 09/22/2012 19:20:00 ET T: 09/22/2012 22:21:49 ET JOB#: [id number]  cc: Mohamed K. Julien Nordmann, MD Kandice Robinsons, MD, <Dictator> Marne A. Glori Bickers, MD    Laron Angelini Ether Griffins MD ELECTRONICALLY SIGNED 10/17/2012 18:00

## 2014-05-20 NOTE — H&P (Signed)
PATIENT NAME:  Pamela, Levine MR#:  [id number] DATE OF BIRTH:  09-Dec-1945  DATE OF ADMISSION:  09/19/2012  PRIMARY CARE PHYSICIAN:  Dr. Loura Pardon.   PRIMARY ONCOLOGIST:  Dr. Julien Nordmann at [facility].   CHIEF COMPLAINT:  Fever, chills.   HISTORY OF PRESENT ILLNESS:  Pamela Levine is a 69 year old, pleasant, white female with a past medical history of lung CA diagnosed in November of 2013.  Has been undergoing chemo and radiation therapy.  The patient is also found to have metastatic lesions in the liver.  Received last chemotherapy on Wednesday, came to the Emergency Department with complaints of fever and chills that started since last night.  Was found to have fever of 102.  Took a few tablets of Tylenol without much improvement.  Concerning this, called her primary oncologist who recommended the patient to go to the hospital.  The patient is found to have elevated white blood cell count of 22,000.  Denied receiving any steroids.  Having cough with mild productive sputum.  Denies having any abdominal pain, nausea, vomiting and diarrhea.  However, this morning felt slightly nauseated.  Denies having any dysuria.  Work-up in the Emergency Department did not show any obvious source of infection.  The patient received one dose of vancomycin and cefepime in the Emergency Department.  Blood cultures were obtained.   PAST MEDICAL HISTORY:  Lung CA metastatic to the liver.   PAST SURGICAL HISTORY:  Partial hysterectomy.   ALLERGIES:  No known drug allergies.   HOME MEDICATIONS:  None.   SOCIAL HISTORY:  Smoked in the past 1 pack a day, quit a few years back.  Denies drinking alcohol or using illicit drugs.  Married, lives with her husband.    FAMILY HISTORY:  Multiple family members with breast cancer, mother and sister and other family members from the mother's side.   REVIEW OF SYSTEMS: CONSTITUTIONAL:  Generalized weakness, loss of weight.  EYES:  No change in vision.  EARS, NOSE, THROAT:  No  change in hearing, sore throat or runny nose.  RESPIRATORY:  Has mild cough with mild productive sputum.  CARDIOVASCULAR:  Denies any chest pain, palpitations.  No pedal edema.   GASTROINTESTINAL:  Has mild nausea.  No episodes of vomiting.  No diarrhea.  GENITOURINARY:  No dysuria, hematuria.  SKIN:  No rash or lesions.  MUSCULOSKELETAL:  No joint pains and aches.  ENDOCRINE:  No polyuria or polydipsia.  HEMATOLOGIC:  No easy bruising or bleeding.  NEUROLOGIC:  No weakness or numbness in any part of the body.   PHYSICAL EXAMINATION: GENERAL:  This is a thin-built, frail-looking female, looks chronically ill, not in distress.  VITAL SIGNS:  Temperature 102, pulse 89, blood pressure 154/68, respiratory rate of 18, oxygen saturation 94% on room air.  HEENT:  Head normocephalic, atraumatic.  Eyes, no sclerae icterus.  Conjunctivae normal.  Pupils equal and react to light.  Mucous membranes dry.  No pharyngeal edema.  NECK:  Supple.  No lymphadenopathy.  No JVD.  No carotid bruit.  No thyromegaly.  CHEST:  Has no focal tenderness.  Has crackles heard in the left lower lobe.  HEART:  S1, S2 regular.  Tachycardia.  ABDOMEN:  Bowel sounds plus.  Soft, nontender, nondistended.  No hepatosplenomegaly.  EXTREMITIES:  No pedal edema.  Pulses 2+.  SKIN:  No rash or lesions.  MUSCULOSKELETAL:  Good range of motion in all the extremities.  NEUROLOGIC:  The patient is alert, oriented to place, person  and time.  Cranial nerves II through XII intact.  Motor 5 by 5 in upper and lower extremities.  No sensory deficits.   LABORATORY DATA:  CBC, WBC of 27,000, hemoglobin 8.3, platelet count of 252.   UA negative for nitrites and leukocyte esterase.   CMP:  BUN 34, creatinine of 1.62, has elevated liver enzymes.  Her alk phos is 964, AST 259, ALT 174.   Lactic acid is 0.7.   Troponin 0.15.   ASSESSMENT AND PLAN:  Pamela Levine is a 69 year old female who is currently receiving chemotherapy, comes to the  Emergency Department with fever.  1.  Fever.  No obvious source of infection.  However, concern about could be from the lung itself.  Continue with vancomycin and cefepime.  Obtain blood cultures.  2.  Lung carcinoma, metastatic, currently undergoing chemotherapy and radiation therapy at [facility] by Dr. Julien Nordmann.  3.  Elevated liver enzymes.  This could be from the metastatic disease, however cannot exclude any gallbladder issues.  We will obtain right upper quadrant ultrasound.  However, we do not have any baseline labs from the previous visits.  4.  Anemia.  The patient has hemoglobin of 8.3.  Again, we do not have any baseline.  We will obtain iron profile, B12, folate, RBC.  5.  Acute renal insufficiency secondary to dehydration.  Continue with IV fluids and follow up.   6.  Protein calorie malnutrition.  The patient has albumin of 2.3.  Increase protein in the diet.  7.  Keep the patient on DVT prophylaxis with Lovenox.   TIME SPENT:  50 minutes.     ____________________________ Monica Becton, MD pv:ea D: 09/19/2012 00:22:26 ET T: 09/19/2012 01:21:18 ET JOB#: [id number]  cc: Monica Becton, MD, <Dictator> Marne A. Glori Bickers, MD Grier Mitts Kristene Liberati MD ELECTRONICALLY SIGNED 09/30/2012 22:04

## 2014-05-20 NOTE — Consult Note (Signed)
History of Present Illness:  Reason for Consult Lung cancer actively receiving chemotherapy in [place].  Symptomatic anemia.   HPI   Patient is a 69 year old female with past medical history significant for stage IV lung cancer with metastasis to her liver.  She last received chemotherapy on September 16, 2012 using carboplatinum and Taxol.  She recently presented to the emergency Department complaining of chills and fever of 102F.  Patient was found to have an elevated white blood cell count and a decreased hemoglobin.  She has a mild cough, but otherwise feels well.  She denies any chest pain.  She has no neurologic complaints.  She denies any nausea, vomiting, constipation, or diarrhea.  Patient otherwise has felt well and offers no further specific complaints.  PFSH:  Additional Past Medical and Surgical History Lung cancer, partial hysterectomy.  Social history: Positive tobacco one pack per day, quit several years ago.  Denies alcohol.  Family history: Multiple family hours with breast cancer.   Review of Systems:  Performance Status (ECOG) 1   Review of Systems   As per HPI. Otherwise, 10 point system review was negative.   NURSING NOTES: **Vital Signs.:   26-Aug-14 16:08   Vital Signs Type: Blood Transfusion Complete   Temperature Temperature (F): 98.1   Celsius: 36.7   Temperature Source: oral   Pulse Pulse: 74   Respirations Respirations: 18   Systolic BP Systolic BP: 161   Diastolic BP (mmHg) Diastolic BP (mmHg): 64   Mean BP: 92   Pulse Ox % Pulse Ox %: 97   Oxygen Delivery: Room Air/ 21 %   Physical Exam:  Physical Exam General: thin, no acute distress. Eyes: Pink conjunctiva, anicteric sclera. HEENT: Normocephalic, moist mucous membranes, clear oropharnyx. Lungs: Clear to auscultation bilaterally. Heart: Regular rate and rhythm. No rubs, murmurs, or gallops. Abdomen: Soft, nontender, nondistended. No organomegaly noted, normoactive bowel  sounds. Musculoskeletal: No edema, cyanosis, or clubbing. Neuro: Alert, answering all questions appropriately. Cranial nerves grossly intact. Skin: No rashes or petechiae noted. Psych: Normal affect.    No Known Allergies:     Levaquin 500 mg oral tablet: 1 tab(s) orally every 24 hours, Status: Active, Quantity: 7, Refills: None   oxyCODONE 5 mg oral tablet: 1 tab(s) orally every 6 hours, As Needed, Status: Active, Quantity: 28, Refills: None   amLODIPine 10 mg oral tablet: 1 tab(s) orally once a day, Status: Active, Quantity: 0, Refills: None   Artificial Tears preserved ophthalmic solution: 1 drop(s) to each affected eye 3 times a day, As Needed, Status: Active, Quantity: 0, Refills: None   Aspir 81 81 mg oral tablet, chewable: 1 tab(s) orally once a day, Status: Active, Quantity: 0, Refills: None   Calcium 600+D: ( 600 mg-400 mg) 2 tab(s) orally once a day, Status: Active, Quantity: 0, Refills: None   ferrous sulfate 325 mg oral tablet: 1 tab(s) orally once a day, Status: Active, Quantity: 0, Refills: None   metoprolol succinate 50 mg oral tablet, extended release: 1 tab(s) orally once a day, Status: Active, Quantity: 0, Refills: None   Multi-Day Plus Minerals Multiple Vitamins with Minerals oral tablet: 1 tab(s) orally once a day, Status: Active, Quantity: 0, Refills: None   simvastatin 20 mg oral tablet: 1 tab(s) orally once a day (at bedtime), Status: Active, Quantity: 0, Refills: None  Laboratory Results:  Hepatic:  26-Aug-14 03:57   Bilirubin, Total 0.5  Alkaline Phosphatase  885  SGPT (ALT)  102  SGOT (AST)  86  Total  Protein, Serum  5.9  Albumin, Serum  1.7  Routine Chem:  26-Aug-14 03:57   Glucose, Serum 81  BUN  26  Creatinine (comp)  1.63  Sodium, Serum  133  Potassium, Serum 3.8  Chloride, Serum 104  CO2, Serum  20  Calcium (Total), Serum 8.7  Osmolality (calc) 270  eGFR (African American)  38  eGFR (Non-African American)  33 (eGFR values <4mL/min/1.73  m2 may be an indication of chronic kidney disease (CKD). Calculated eGFR is useful in patients with stable renal function. The eGFR calculation will not be reliable in acutely ill patients when serum creatinine is changing rapidly. It is not useful in  patients on dialysis. The eGFR calculation may not be applicable to patients at the low and high extremes of body sizes, pregnant women, and vegetarians.)  Anion Gap 9 (Result(s) reported on 22 Sep 2012 at 05:11AM.)  Routine Hem:  26-Aug-14 03:57   Hemoglobin (CBC)  6.8 (Result(s) reported on 22 Sep 2012 at 05:15AM.)   Assessment and Plan: Impression:   Stage IV lung cancer, symptomatic anemia. Plan:   1.  Stage IV lung cancer: Patient is actively receiving chemotherapy in [place] under the direction of Dr. Earlie Server.  She last received treatment on September 16, 2012 with carboplatinum and gemcitabine.  Patient states her next treatment was initially supposed to be scheduled tomorrow with gemcitabine only.  This will likely need to be delayed given her underlying infection as well as severe anemia.  Dr. Lew Dawes office has been contacted and they will call the patient as to when her next appointment will be. Anemia: Secondary chemotherapy.  Agree with 1 unit of packed red blood cells.  Patient can likely be discharged after receiving her transfusion. consult, call with questions.  Electronic Signatures: Delight Hoh (MD)  (Signed 27-Aug-14 13:59)  Authored: HISTORY OF PRESENT ILLNESS, PFSH, ROS, NURSING NOTES, PE, ALLERGIES, HOME MEDICATIONS, LABS, ASSESSMENT AND PLAN   Last Updated: 27-Aug-14 13:59 by Delight Hoh (MD)

## 2015-04-25 IMAGING — CT CT CHEST W/ CM
2 of 3 series · 15 of 36 positions shown, 18 images · IV contrast (OMNIPAQUE)
Comparison: 05/22/2012

CLINICAL DATA: Restaging lung cancer

CT CHEST WITH CONTRAST
TECHNIQUE: Multidetector CT imaging of the chest was performed
following the standard protocol during bolus administration of
intravenous contrast.
Contrast: 80mL OMNIPAQUE IOHEXOL 300 MG/ML  SOLN

[Series 2: chest with st · axial · 0.64mm/px · z∈[-287,-12]mm · 12 of 65 slices shown, 15 images]
[im 5/65  mediastinal]
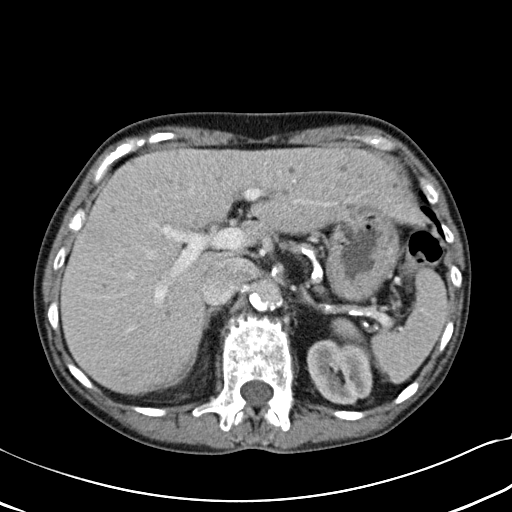
[im 5/65  lung]
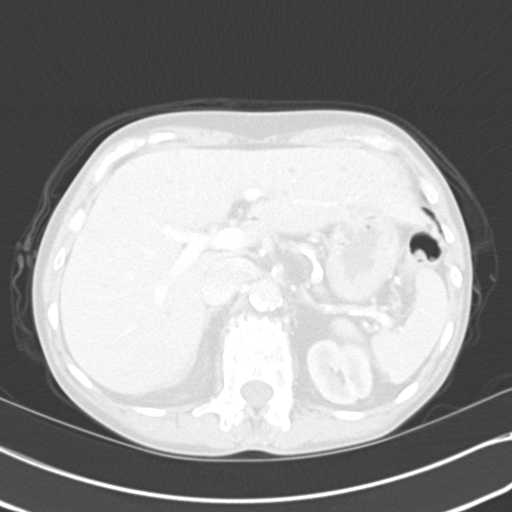
[im 10/65  lung]
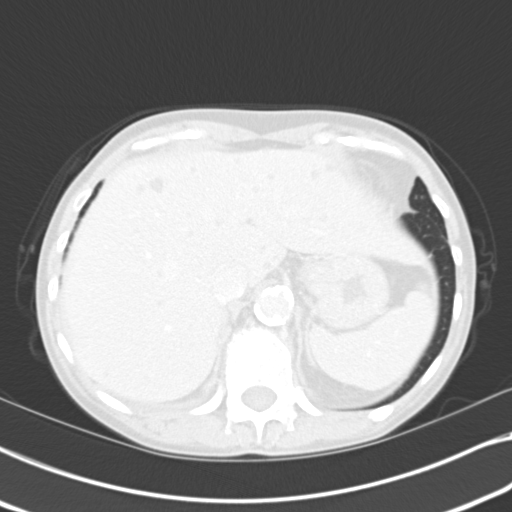
[im 15/65  lung]
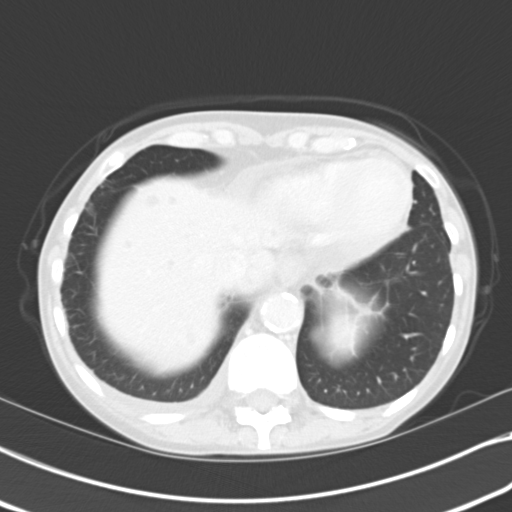
[im 19/65  lung]
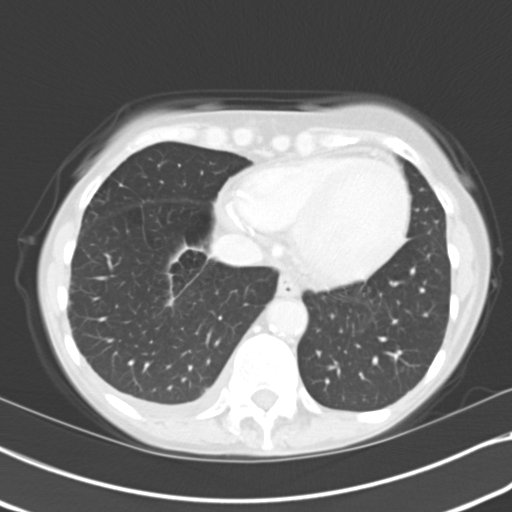
[im 24/65  mediastinal]
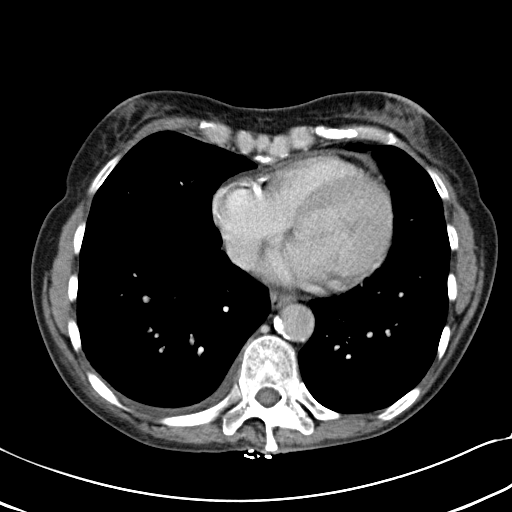
[im 24/65  lung]
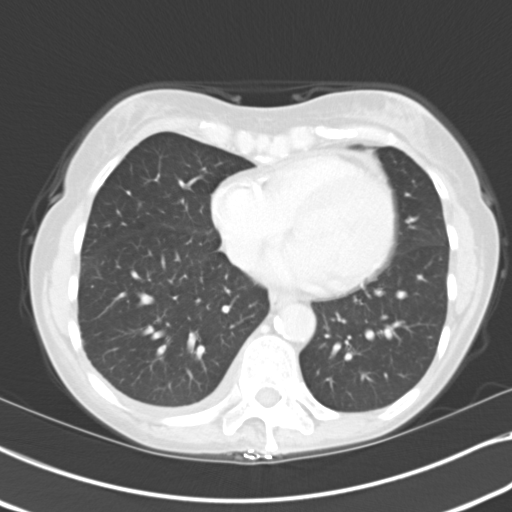
[im 29/65  lung]
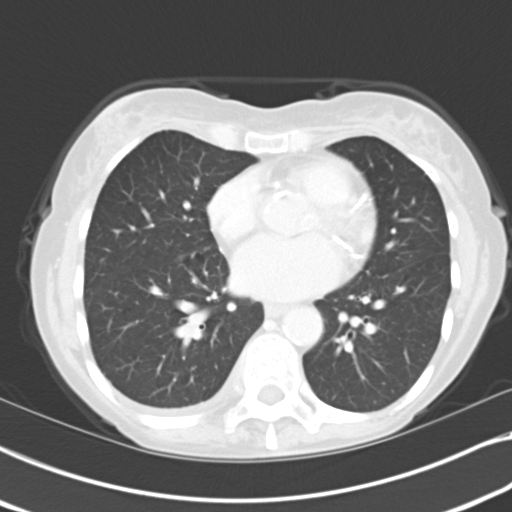
[im 36/65  lung]
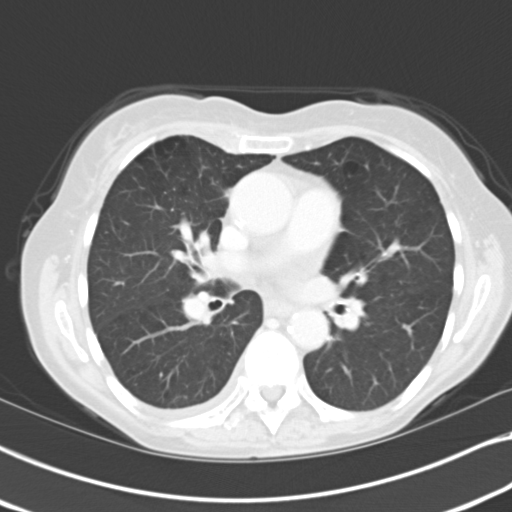
[im 41/65  lung]
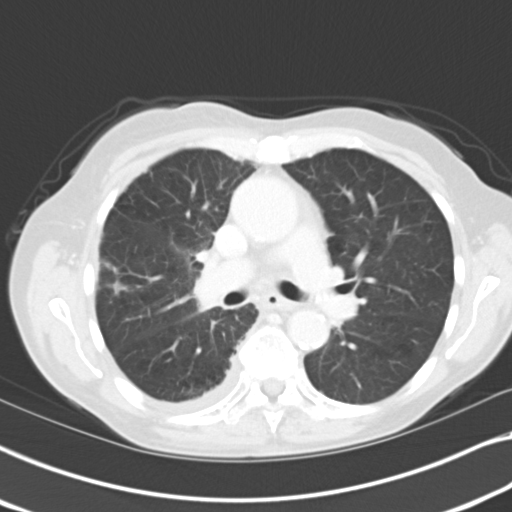
[im 46/65  mediastinal]
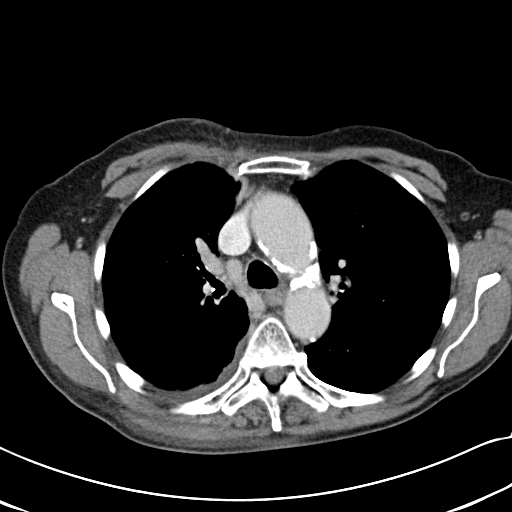
[im 46/65  lung]
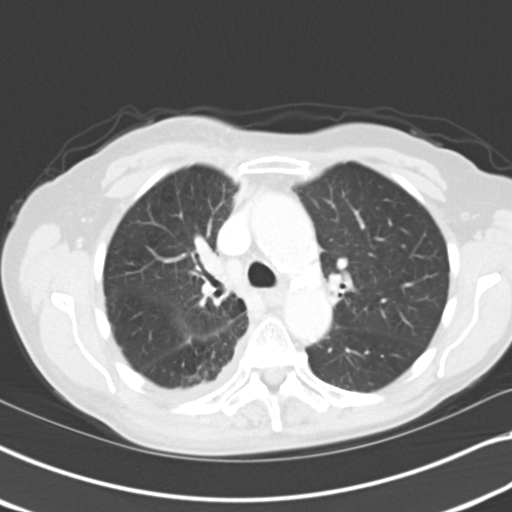
[im 50/65  lung]
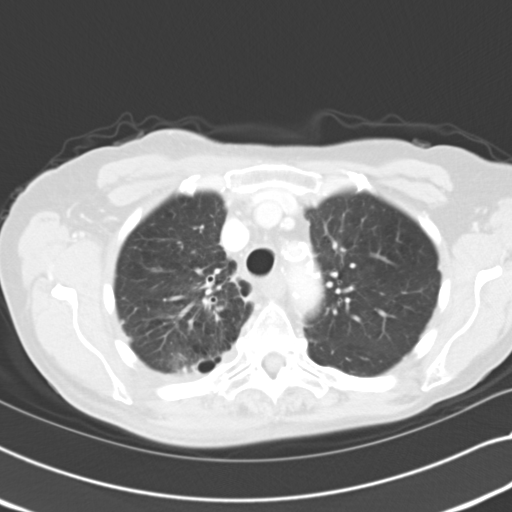
[im 55/65  lung]
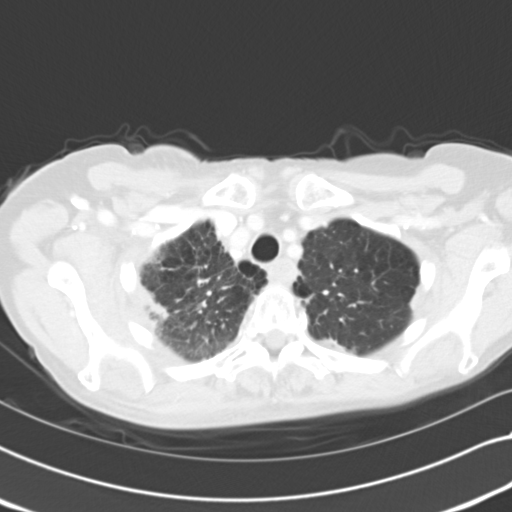
[im 60/65  lung]
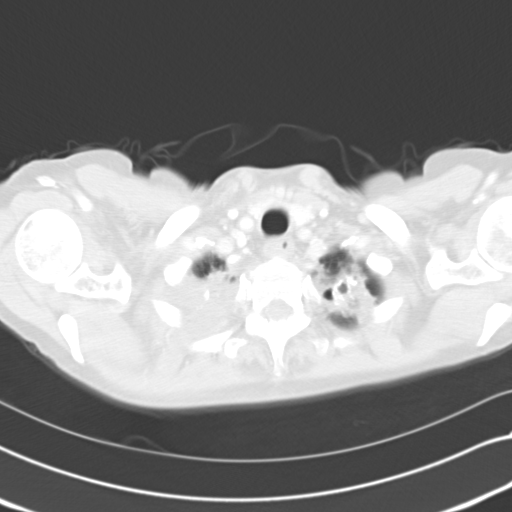

[Series 602: <mpr thick range> · coronal · 0.64mm/px · 3 of 76 slices shown]
[im 16/76  lung]
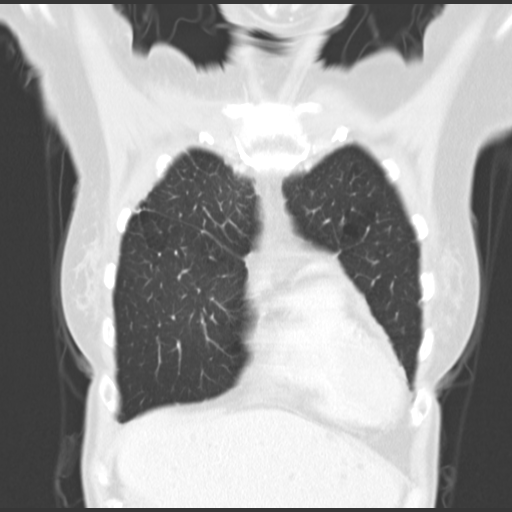
[im 31/76  lung]
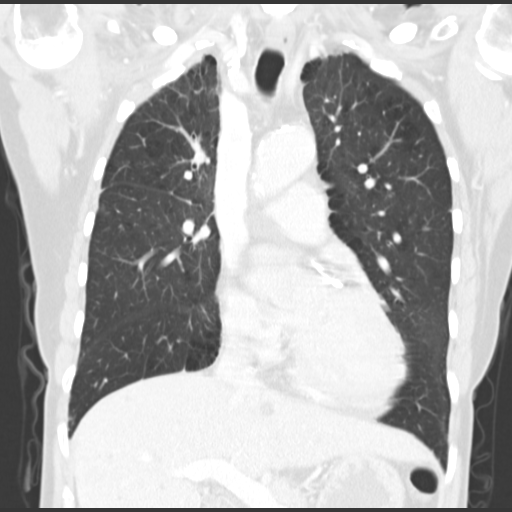
[im 46/76  lung]
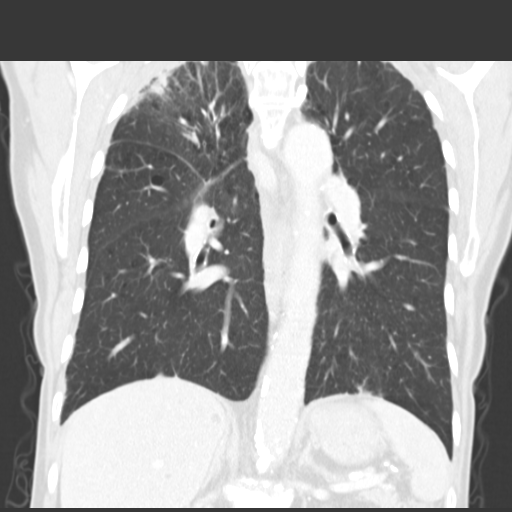

[15 of 36 positions shown; findings below may reference images not displayed]

FINDINGS: There is a small right pleural effusion identified.
Stable appearance of the right apical lung density.  Similar
appearance of nodular scarring within the right middle lobe is
identified, image 25/series 5.  This likely reflects a combination
of pleural parenchymal scarring and known lung lesion. There is a 5
mm nodule in the left lower lobe, image 48/series 5.  This is
unchanged from previous exam.  Left upper lobe nodule is unchanged
measuring 3 mm, image 20/series 5.  There is a new nodule in the
right middle lobe measuring 7 mm, image 43/series 5.

The heart size appears normal.  No pericardial effusion.  Prominent
coronary artery calcifications involve the LAD, left circumflex and
RCA coronary arteries. No mediastinal or hilar adenopathy
identified.

There is no axillary or supraclavicular adenopathy.

Interval development of multi focal low attenuation lesions
throughout the liver parenchyma identified.  Index lesion in the
anterior right hepatic lobe measures 0.8 cm, image 56/series 2.
Posterior right hepatic lobe lesion measures 6 mm, image 62/series
2.  Within the left hepatic lobe there is a 7 mm low attenuation
lesion, image 53/series 2.  The adrenal glands both appear normal.

Review of the visualized bony structures is significant for
multilevel spondylosis.
IMPRESSION: 1.  Interval development of multifocal low attenuation lesions
within the liver parenchyma.  Suspicious for metastatic disease.
2.  No significant change in the appearance of the lungs.  There is
a new nodule in the right middle lobe which measures 7 mm.
3.  Coronary artery calcifications.
